# Patient Record
Sex: Female | Born: 1940 | Race: Black or African American | Hispanic: No | Smoking: Never smoker
Health system: Southern US, Community
[De-identification: ages and names within clinical notes are randomized; demographics above are authoritative.]

## PROBLEM LIST (undated history)

## (undated) DIAGNOSIS — T7840XA Allergy, unspecified, initial encounter: Secondary | ICD-10-CM

## (undated) DIAGNOSIS — M1990 Unspecified osteoarthritis, unspecified site: Secondary | ICD-10-CM

## (undated) DIAGNOSIS — I509 Heart failure, unspecified: Secondary | ICD-10-CM

## (undated) DIAGNOSIS — E785 Hyperlipidemia, unspecified: Secondary | ICD-10-CM

## (undated) DIAGNOSIS — F419 Anxiety disorder, unspecified: Secondary | ICD-10-CM

## (undated) DIAGNOSIS — R0902 Hypoxemia: Secondary | ICD-10-CM

## (undated) DIAGNOSIS — F32A Depression, unspecified: Secondary | ICD-10-CM

## (undated) DIAGNOSIS — F329 Major depressive disorder, single episode, unspecified: Secondary | ICD-10-CM

## (undated) HISTORY — PX: EYE SURGERY: SHX253

## (undated) HISTORY — DX: Allergy, unspecified, initial encounter: T78.40XA

## (undated) HISTORY — DX: Major depressive disorder, single episode, unspecified: F32.9

## (undated) HISTORY — DX: Depression, unspecified: F32.A

## (undated) HISTORY — PX: ABDOMINAL HYSTERECTOMY: SHX81

## (undated) HISTORY — DX: Hyperlipidemia, unspecified: E78.5

## (undated) HISTORY — DX: Unspecified osteoarthritis, unspecified site: M19.90

## (undated) HISTORY — DX: Hypoxemia: R09.02

## (undated) HISTORY — DX: Anxiety disorder, unspecified: F41.9

## (undated) HISTORY — PX: CATARACT EXTRACTION: SUR2

---

## 2014-10-22 DIAGNOSIS — I10 Essential (primary) hypertension: Secondary | ICD-10-CM | POA: Diagnosis not present

## 2015-05-11 DIAGNOSIS — I10 Essential (primary) hypertension: Secondary | ICD-10-CM | POA: Diagnosis not present

## 2015-05-11 DIAGNOSIS — R7309 Other abnormal glucose: Secondary | ICD-10-CM | POA: Diagnosis not present

## 2015-06-23 DIAGNOSIS — Z1231 Encounter for screening mammogram for malignant neoplasm of breast: Secondary | ICD-10-CM | POA: Diagnosis not present

## 2018-05-23 ENCOUNTER — Ambulatory Visit
Admission: RE | Admit: 2018-05-23 | Discharge: 2018-05-23 | Disposition: A | Discharge status: Home / Self Care | Payer: Self-pay | Source: Ambulatory Visit | Attending: Family Medicine | Admitting: Family Medicine

## 2018-05-23 ENCOUNTER — Other Ambulatory Visit: Payer: Self-pay | Admitting: Family Medicine

## 2018-05-23 DIAGNOSIS — J9801 Acute bronchospasm: Secondary | ICD-10-CM

## 2018-06-15 ENCOUNTER — Ambulatory Visit
Admission: RE | Admit: 2018-06-15 | Discharge: 2018-06-15 | Disposition: A | Discharge status: Home / Self Care | Payer: Medicare Other | Source: Ambulatory Visit | Attending: Family Medicine | Admitting: Family Medicine

## 2018-06-15 ENCOUNTER — Other Ambulatory Visit: Payer: Self-pay | Admitting: Family Medicine

## 2018-06-15 DIAGNOSIS — J181 Lobar pneumonia, unspecified organism: Secondary | ICD-10-CM

## 2018-08-28 ENCOUNTER — Ambulatory Visit: Payer: Medicare Other | Admitting: Pulmonary Disease

## 2018-08-28 ENCOUNTER — Encounter: Payer: Self-pay | Admitting: Pulmonary Disease

## 2018-08-28 ENCOUNTER — Ambulatory Visit (INDEPENDENT_AMBULATORY_CARE_PROVIDER_SITE_OTHER)
Admission: RE | Admit: 2018-08-28 | Discharge: 2018-08-28 | Disposition: A | Discharge status: Home / Self Care | Payer: Medicare Other | Source: Ambulatory Visit | Attending: Pulmonary Disease | Admitting: Pulmonary Disease

## 2018-08-28 VITALS — BP 126/78 | HR 102 | Ht 65.0 in | Wt 161.0 lb

## 2018-08-28 DIAGNOSIS — R918 Other nonspecific abnormal finding of lung field: Secondary | ICD-10-CM | POA: Diagnosis not present

## 2018-08-28 DIAGNOSIS — J189 Pneumonia, unspecified organism: Secondary | ICD-10-CM

## 2018-08-28 NOTE — Patient Instructions (Signed)
Chest x-ray to follow-up on pneumonia. Ambulatory saturation.  Based on this we will decide next step

## 2018-08-28 NOTE — Progress Notes (Signed)
Subjective:    Patient ID: Sabrina Douglas, female    DOB: 10/04/1940, 78 y.o.   MRN: [id number]  HPI  Chief Complaint  Patient presents with  . Consult    self referral for SOB on exertion   78 year old never smoker presents as a self-referral for evaluation of pneumonia. She reports that she was diagnosed with "double pneumonia" in October 2019. She presented with a lingering cough that seems to have been ongoing for some time prior to this episode to her PCP, chest x-ray from 05/23/2018 was personally reviewed which shows bilateral left more than right airspace disease.  She was started on azithromycin and given albuterol nebs in the office also given a Symbicort inhaler.  She initially improved somewhat but daughter feels that symptoms have mostly persisted. Follow-up chest x-ray 06/15/2018 showed slight decrease in bilateral airspace disease. She reports a dry cough especially in the mornings, increase shortness of breath to the point of even on talking excessively over routine activities daily.  She also complains of generalized weakness. She has lost 12 pounds from 173 to her current weight of 161 pounds. She reports bilateral leg swelling for about 2 years.  She reports increased nasal drainage and has been taking Mucinex for this.  Chest x-ray was obtained again today which shows bilateral interstitial infiltrates left more than right.  She is widowed, she is a retired [employer] representative. Past medical history of hypertension hyperlipidemia  On ambulation saturation dropped to 92% on third lap and heart rate went up from 102 resting to 128 and ambulation was stopped due to shortness of breath  Past Medical History:  Diagnosis Date  . Anxiety   . Depression   . Hyperlipidemia    History reviewed. No pertinent surgical history.  No Known Allergies  Social History   Socioeconomic History  . Marital status: Divorced    Spouse name: Not on file  . Number of children: Not on file  .  Years of education: Not on file  . Highest education level: Not on file  Occupational History  . Not on file  Social Needs  . Financial resource strain: Not on file  . Food insecurity:    Worry: Not on file    Inability: Not on file  . Transportation needs:    Medical: Not on file    Non-medical: Not on file  Tobacco Use  . Smoking status: Never Smoker  . Smokeless tobacco: Never Used  Substance and Sexual Activity  . Alcohol use: Never    Frequency: Never  . Drug use: Not on file  . Sexual activity: Not on file  Lifestyle  . Physical activity:    Days per week: Not on file    Minutes per session: Not on file  . Stress: Not on file  Relationships  . Social connections:    Talks on phone: Not on file    Gets together: Not on file    Attends religious service: Not on file    Active member of club or organization: Not on file    Attends meetings of clubs or organizations: Not on file    Relationship status: Not on file  . Intimate partner violence:    Fear of current or ex partner: Not on file    Emotionally abused: Not on file    Physically abused: Not on file    Forced sexual activity: Not on file  Other Topics Concern  . Not on file  Social History Narrative  .  Not on file    History reviewed. No pertinent family history.   Review of Systems  Constitutional: Positive for fatigue.  HENT: Positive for congestion.   Eyes: Negative.   Respiratory: Positive for shortness of breath and wheezing.   Cardiovascular: Negative.   Gastrointestinal: Negative.   Endocrine: Negative.   Genitourinary: Negative.   Musculoskeletal: Negative.   Skin: Negative.   Allergic/Immunologic: Negative.   Neurological: Negative.   Hematological: Negative.   Psychiatric/Behavioral: Negative.        Objective:   Physical Exam  Gen. Pleasant, obese, in no distress, normal affect ENT - no pallor,icterus, no post nasal drip, class 2-3 airway Neck: No JVD, no thyromegaly, no  carotid bruits Lungs: no use of accessory muscles, no dullness to percussion, left basal rales no  rhonchi  Cardiovascular: Rhythm regular, heart sounds  normal, no murmurs or gallops, 1+ Johnny BridgeSaraiya will see you tomorrow peripheral edema Abdomen: soft and non-tender, no hepatosplenomegaly, BS normal. Musculoskeletal: No deformities, no cyanosis or clubbing Neuro:  alert, non focal, no tremors       Assessment & Plan:

## 2018-08-28 NOTE — Assessment & Plan Note (Signed)
She has bilateral pulmonary infiltrates which have improved compared to October 2019 and today's film but have mostly persisted.  She seems to have had a lingering cough from prior to this episode.  She never had any acute illness so I would doubt that she had bacterial pneumonia.  But would be concerned about interstitial lung disease. We will proceed with high-resolution CT of the chest and based on this we will further clarify further work-up  She does seem to desaturate on ambulation, if infiltrates are not significant then we will consider for pulmonary embolism although this seems to be less likely

## 2018-08-29 ENCOUNTER — Telehealth: Payer: Self-pay | Admitting: Pulmonary Disease

## 2018-08-29 NOTE — Telephone Encounter (Signed)
See result note from 08/29/2018.

## 2018-08-30 ENCOUNTER — Telehealth: Payer: Self-pay | Admitting: Pulmonary Disease

## 2018-08-30 DIAGNOSIS — J849 Interstitial pulmonary disease, unspecified: Secondary | ICD-10-CM

## 2018-08-30 DIAGNOSIS — R918 Other nonspecific abnormal finding of lung field: Secondary | ICD-10-CM

## 2018-08-30 MED ORDER — ALPRAZOLAM 0.5 MG PO TABS: ORAL_TABLET | ORAL | 0 refills | Status: DC

## 2018-08-30 NOTE — Telephone Encounter (Signed)
Spoke with pt regarding the result note from 08/29/18:    Notes recorded by Oretha Milch, MD on 08/29/2018 at 5:24 PM EST Okay to provide Xanax 0.5 mg x 1 ------  Notes recorded by Maurene Capes, CMA on 08/29/2018 at 3:05 PM EST Spoke with patient's daughter. She verbalized understanding. She wants to know if the patient will need an antibiotic prior to the CT scan? She also stated that the patient is severely claustrophobic and is scared of CTs. If the CT is necessary, she would like to have a sedative for her nerves.   RA, please advise. ------  Notes recorded by Oretha Milch, MD on 08/29/2018 at 9:12 AM EST Interstitial pneumonia type work markings are still present on her chest x-ray. Would recommend high-resolution CT of the chest without contrast for further evaluation  CT ordered and daughter aware of recs regarding alprazolam  Rx was called to pharm

## 2018-08-30 NOTE — Addendum Note (Signed)
Addended by: Oretha Milch on: 08/30/2018 03:55 PM   Modules accepted: Orders

## 2018-09-04 ENCOUNTER — Telehealth: Payer: Self-pay | Admitting: Pulmonary Disease

## 2018-09-04 ENCOUNTER — Encounter (HOSPITAL_COMMUNITY): Payer: Self-pay

## 2018-09-04 ENCOUNTER — Emergency Department (HOSPITAL_COMMUNITY): Payer: Medicare Other

## 2018-09-04 ENCOUNTER — Emergency Department (HOSPITAL_COMMUNITY)
Admission: EM | Admit: 2018-09-04 | Discharge: 2018-09-04 | Disposition: A | Discharge status: Home / Self Care | Payer: Medicare Other | Attending: Emergency Medicine | Admitting: Emergency Medicine

## 2018-09-04 DIAGNOSIS — Z79899 Other long term (current) drug therapy: Secondary | ICD-10-CM | POA: Diagnosis not present

## 2018-09-04 DIAGNOSIS — E785 Hyperlipidemia, unspecified: Secondary | ICD-10-CM | POA: Insufficient documentation

## 2018-09-04 DIAGNOSIS — B370 Candidal stomatitis: Secondary | ICD-10-CM | POA: Diagnosis not present

## 2018-09-04 DIAGNOSIS — J849 Interstitial pulmonary disease, unspecified: Secondary | ICD-10-CM | POA: Diagnosis not present

## 2018-09-04 DIAGNOSIS — R0602 Shortness of breath: Secondary | ICD-10-CM | POA: Diagnosis present

## 2018-09-04 LAB — CBC
HCT: 42.2 % (ref 36.0–46.0)
Hemoglobin: 13.3 g/dL (ref 12.0–15.0)
MCH: 30.1 pg (ref 26.0–34.0)
MCHC: 31.5 g/dL (ref 30.0–36.0)
MCV: 95.5 fL (ref 80.0–100.0)
Platelets: 361 10*3/uL (ref 150–400)
RBC: 4.42 MIL/uL (ref 3.87–5.11)
RDW: 12.1 % (ref 11.5–15.5)
WBC: 4.9 10*3/uL (ref 4.0–10.5)
nRBC: 0 % (ref 0.0–0.2)

## 2018-09-04 LAB — BASIC METABOLIC PANEL
Anion gap: 14 (ref 5–15)
BUN: <5 mg/dL — ABNORMAL LOW (ref 8–23)
CO2: 26 mmol/L (ref 22–32)
Calcium: 9.4 mg/dL (ref 8.9–10.3)
Chloride: 94 mmol/L — ABNORMAL LOW (ref 98–111)
Creatinine, Ser: 0.68 mg/dL (ref 0.44–1.00)
GFR calc Af Amer: >60 mL/min (ref >60)
GFR calc non Af Amer: >60 mL/min (ref >60)
Glucose, Bld: 116 mg/dL — ABNORMAL HIGH (ref 70–99)
Potassium: 4.6 mmol/L (ref 3.5–5.1)
Sodium: 134 mmol/L — ABNORMAL LOW (ref 135–145)

## 2018-09-04 LAB — I-STAT TROPONIN, ED: Troponin i, poc: 0.01 ng/mL (ref 0.00–0.08)

## 2018-09-04 LAB — BRAIN NATRIURETIC PEPTIDE: B Natriuretic Peptide: 15.5 pg/mL (ref 0.0–100.0)

## 2018-09-04 MED ORDER — IOPAMIDOL (ISOVUE-370) INJECTION 76%
100.0000 mL | Freq: Once | INTRAVENOUS | Status: AC | PRN
  Administered 2018-09-04: 100 mL via INTRAVENOUS

## 2018-09-04 MED ORDER — NYSTATIN 100000 UNIT/ML MT SUSP: 500000.0000 [IU] | Freq: Four times a day (QID) | OROMUCOSAL | 0 refills | Status: DC

## 2018-09-04 MED ORDER — PREDNISONE 20 MG PO TABS: 40.0000 mg | ORAL_TABLET | Freq: Every day | ORAL | 0 refills | Status: DC

## 2018-09-04 NOTE — Telephone Encounter (Signed)
Spoke to Dr. Clarene DukeLittle. Suggested she add CCP and ANA to her blood work. High-resolution CT chest is scheduled for 1/30. 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

## 2018-09-04 NOTE — ED Triage Notes (Signed)
Per [facility], pt endorses Increased shob x 1 week with palpitations with exertion. Has had pneumonia since October. Due for CT scan tomorrow. Breath sounds clear. Placed on Leonard 2L.

## 2018-09-04 NOTE — Telephone Encounter (Signed)
Will route message to Dr. Vassie Loll. I have made Cherina aware of this.

## 2018-09-04 NOTE — ED Notes (Signed)
ED Provider at bedside. 

## 2018-09-04 NOTE — ED Notes (Signed)
Water given to pt after getting approval from MD Little.

## 2018-09-04 NOTE — ED Provider Notes (Signed)
[facility]   CSN: [id number] Arrival date & time: 09/04/18  [id number]     History   Chief Complaint Chief Complaint  Patient presents with  . Pneumonia  . Shortness of Breath  . Palpitations    HPI Sabrina Douglas is a 78 y.o. female.  78 year old female with past medical history including hypertension, hyperlipidemia, anxiety/depression who presents with shortness of breath.  Patient was diagnosed w/ pneumonia in October of last year, pleated course of antibiotics.  Family states that she initially seemed to be getting better but since December has been progressively more short of breath.  Her shortness of breath is worse with exertion.  She has not noticed any orthopnea or paroxysmal nocturnal dyspnea.  She has chronic bilateral lower extremity edema that is been present for the past year.  She notes a persistent cough productive of mucus.  The cough is usually worse in the morning and then gets better once she clears some mucus.  No fevers or chest pain. No recent travel. She was seen by [facility] recently in clinic and they have scheduled a chest CT.  The history is provided by the patient and a relative.  Pneumonia  Associated symptoms include shortness of breath.  Shortness of Breath  Palpitations  Associated symptoms: shortness of breath     Past Medical History:  Diagnosis Date  . Anxiety   . Depression   . Hyperlipidemia     Patient Active Problem List   Diagnosis Date Noted  . Pulmonary infiltrates on CXR 08/28/2018    History reviewed. No pertinent surgical history.   OB History   No obstetric history on file.      Home Medications    Prior to Admission medications   Medication Sig Start Date End Date Taking? Authorizing Provider  Glycerin-Hypromellose-PEG 400 ([facility] DRY EYE RELIEF) 0.2-0.2-1 % SOLN Place 2 drops into both eyes as needed (Dry eyes).   Yes [provider]  metoprolol tartrate  (LOPRESSOR) 25 MG tablet Take 25 mg by mouth 2 (two) times daily.   Yes [provider]  Multiple Vitamin (MULTIVITAMIN WITH MINERALS) TABS tablet Take 1 tablet by mouth daily.   Yes [provider]  simvastatin (ZOCOR) 20 MG tablet Take 20 mg by mouth daily.   Yes [provider]  spironolactone (ALDACTONE) 25 MG tablet Take 25 mg by mouth daily.   Yes [provider]  ALPRAZolam Prudy Feeler(XANAX) 0.5 MG tablet 1 tablet 30 min prior to ct 08/30/18   Oretha MilchAlva, Rakesh V, MD  nystatin (MYCOSTATIN) 100000 UNIT/ML suspension Take 5 mLs (500,000 Units total) by mouth 4 (four) times daily. For 1 week or until white plaque on tongue resolves 09/04/18   Little, Ambrose Finlandachel Morgan, MD  predniSONE (DELTASONE) 20 MG tablet Take 2 tablets (40 mg total) by mouth daily. Take 40 mg by mouth daily for 3 days, then 20mg  by mouth daily for 3 days, then 10mg  daily for 3 days 09/04/18   Little, Ambrose Finlandachel Morgan, MD    Family History History reviewed. No pertinent family history.  Social History Social History   Tobacco Use  . Smoking status: Never Smoker  . Smokeless tobacco: Never Used  Substance Use Topics  . Alcohol use: Never    Frequency: Never  . Drug use: Not on file     Allergies   Patient has no known allergies.   Review of Systems Review of Systems  Respiratory: Positive for shortness of  breath.   Cardiovascular: Positive for palpitations.   All other systems reviewed and are negative except that which was mentioned in HPI   Physical Exam Updated Vital Signs BP 116/62   Pulse (!) 101   Temp 98.3 F (36.8 C) (Oral)   Resp (!) 22   SpO2 100%   Physical Exam Vitals signs and nursing note reviewed.  Constitutional:      General: She is not in acute distress.    Appearance: She is well-developed.  HENT:     Head: Normocephalic and atraumatic.     Mouth/Throat:     Mouth: Mucous membranes are moist.     Comments: Thrush on tongue Eyes:     Conjunctiva/sclera:  Conjunctivae normal.  Neck:     Musculoskeletal: Neck supple.  Cardiovascular:     Rate and Rhythm: Regular rhythm. Tachycardia present.     Heart sounds: Murmur present.  Pulmonary:     Effort: Pulmonary effort is normal. Tachypnea present. No respiratory distress.     Breath sounds: Decreased breath sounds present.     Comments: Mild tachypnea at rest, diminished b/l w/ crackles in bases R>L Abdominal:     General: Bowel sounds are normal. There is no distension.     Palpations: Abdomen is soft.     Tenderness: There is no abdominal tenderness.  Musculoskeletal:     Right lower leg: She exhibits no tenderness. Edema present.     Left lower leg: She exhibits no tenderness. Edema present.     Comments: Mild BLE edema at ankles  Skin:    General: Skin is warm and dry.  Neurological:     Mental Status: She is alert and oriented to person, place, and time.     Comments: Fluent speech  Psychiatric:        Mood and Affect: Mood is anxious.        Judgment: Judgment normal.      ED Treatments / Results  Labs (all labs ordered are listed, but only abnormal results are displayed) Labs Reviewed  BASIC METABOLIC PANEL - Abnormal; Notable for the following components:      Result Value   Sodium 134 (*)    Chloride 94 (*)    Glucose, Bld 116 (*)    BUN <5 (*)    All other components within normal limits  CBC  BRAIN NATRIURETIC PEPTIDE  CYCLIC CITRUL PEPTIDE ANTIBODY, IGG/IGA  ANTINUCLEAR ANTIBODIES, IFA  I-STAT TROPONIN, ED    EKG EKG Interpretation  Date/Time:  Tuesday September 04 2018 07:53:38 EST Ventricular Rate:  102 PR Interval:  148 QRS Duration: 124 QT Interval:  368 QTC Calculation: 479 R Axis:   -43 Text Interpretation:  Sinus tachycardia Left axis deviation Right bundle branch block Inferior infarct , age undetermined Abnormal ECG No previous ECGs available Confirmed by Frederick Peers [phone]) on 09/04/2018 8:42:57 AM   Radiology Dg Chest 2 View  Result  Date: 09/04/2018 CLINICAL DATA:  Shortness of breath EXAM: CHEST - 2 VIEW COMPARISON:  08/28/17 FINDINGS: Low volume chest with interstitial opacities on the left more than right, present since at least 05/23/2018 There is no edema, consolidation, effusion, or pneumothorax. Normal heart size and mediastinal contours. IMPRESSION: Low volume chest and interstitial opacities without progression since most remote chest x-ray 05/23/2018. This is most likely scarring or fibrosis. No acute finding. Electronically Signed   By: Marnee Spring M.D.   On: 09/04/2018 08:54   Ct Angio Chest Pe W/cm &/or Wo  Cm  Result Date: 09/04/2018 CLINICAL DATA:  Shortness of breath. EXAM: CT ANGIOGRAPHY CHEST WITH CONTRAST TECHNIQUE: Multidetector CT imaging of the chest was performed using the standard protocol during bolus administration of intravenous contrast. Multiplanar CT image reconstructions and MIPs were obtained to evaluate the vascular anatomy. CONTRAST:  100mL ISOVUE-370 IOPAMIDOL (ISOVUE-370) INJECTION 76% COMPARISON:  Chest x-ray from same day. FINDINGS: Cardiovascular: Satisfactory opacification of the pulmonary arteries to the segmental level. No evidence of pulmonary embolism. Mild right heart enlargement. No pericardial effusion. No thoracic aortic aneurysm or dissection. Mediastinum/Nodes: No enlarged mediastinal, hilar, or axillary lymph nodes. Mild enlargement of the right thyroid lobe without focal abnormality. The trachea and esophagus demonstrate no significant findings. Lungs/Pleura: Lower lobe predominant volume loss, bronchiectasis, ground-glass opacity, and peribronchovascular thickening. Scarring in the lingula. No focal consolidation, pleural effusion, or pneumothorax. 5 mm perifissural nodule in the left upper lobe (series 6, image 29). Upper Abdomen: No acute abnormality. Musculoskeletal: No chest wall abnormality. No acute or significant osseous findings. Review of the MIP images confirms the above  findings. IMPRESSION: 1. No evidence of pulmonary embolism. No acute intrathoracic process. 2. Lower lobe predominant volume loss, bronchiectasis, ground-glass opacity, and peribronchovascular thickening, suspicious for fibrotic nonspecific interstitial pneumonia. Recommend non-emergent high-resolution chest CT for further evaluation. 3. 5 mm perifissural nodule in the left upper lobe. No follow-up needed if patient is low-risk. Non-contrast chest CT can be considered in 12 months if patient is high-risk. This recommendation follows the consensus statement: Guidelines for Management of Incidental Pulmonary Nodules Detected on CT Images: From the Fleischner Society 2017; Radiology 2017; 284:228-243. Electronically Signed   By: Obie DredgeWilliam T Derry M.D.   On: 09/04/2018 13:08    Procedures Procedures (including critical care time)  Medications Ordered in ED Medications  iopamidol (ISOVUE-370) 76 % injection 100 mL (100 mLs Intravenous Contrast Given 09/04/18 1247)     Initial Impression / Assessment and Plan / ED Course  I have reviewed the triage vital signs and the nursing notes.  Pertinent labs & imaging results that were available during my care of the patient were reviewed by me and considered in my medical decision making (see chart for details).     No respiratory distress, O2 sat mid-90s, borderline tachycardia around 105. No O2 requirement. CXR w/ interstitial opacities. Atypical infection is possible, however given chronicity of SOB, I suspect interstitial lung disease. Basic labs and trop reassuring. BNP normal. CTA chest is negative for PE, shows findings c/w fibrotic ILD. I discussed these findings w/ her pulmonogist, Dr. Vassie LollAlva, who also reviewed images. He recommended trial of steroids and add-on of ANA, CPP. He will f/u these labs and see pt in clinic. I discussed steroid course as well as nystatin for oral thrush.  Extensively reviewed return precautions.  Patient and family voiced  understanding.  Final Clinical Impressions(s) / ED Diagnoses   Final diagnoses:  Interstitial lung disease (HCC)  Oral thrush    ED Discharge Orders         Ordered    predniSONE (DELTASONE) 20 MG tablet  Daily     09/04/18 1546    nystatin (MYCOSTATIN) 100000 UNIT/ML suspension  4 times daily     09/04/18 1546           Little, Ambrose Finlandachel Morgan, MD 09/04/18 1556

## 2018-09-04 NOTE — Telephone Encounter (Signed)
Spoke with Dr. Clarene Duke. She states that the pt came to the ER and she ordered a CT on the pt. She wants to speak to Dr. Vassie Loll about the findings.

## 2018-09-04 NOTE — ED Notes (Signed)
Dr. Vassie Loll paged to [phone]-per Dr. Brunetta Genera by Marylene Land

## 2018-09-04 NOTE — ED Notes (Signed)
Patient verbalizes understanding of discharge instructions. Opportunity for questioning and answers were provided. Armband removed by staff, pt discharged from ED via wheelchair to home.  

## 2018-09-04 NOTE — ED Notes (Signed)
This RN assisted pt to bathroom in wheelchair.

## 2018-09-05 LAB — FANA STAINING PATTERNS: Speckled Pattern: 1:1280 {titer}

## 2018-09-05 LAB — ANTINUCLEAR ANTIBODIES, IFA: ANA Ab, IFA: POSITIVE — AB

## 2018-09-05 LAB — CYCLIC CITRUL PEPTIDE ANTIBODY, IGG/IGA: CCP Antibodies IgG/IgA: 11 units (ref 0–19)

## 2018-09-05 NOTE — Telephone Encounter (Signed)
Pt is scheduled to see you on 09/27/2018, is this okay?

## 2018-09-05 NOTE — Telephone Encounter (Signed)
Needs sooner appointment week after next

## 2018-09-05 NOTE — Telephone Encounter (Signed)
Left message for patient's daughter to call back in the morning.

## 2018-09-06 ENCOUNTER — Ambulatory Visit (INDEPENDENT_AMBULATORY_CARE_PROVIDER_SITE_OTHER)
Admission: RE | Admit: 2018-09-06 | Discharge: 2018-09-06 | Disposition: A | Discharge status: Home / Self Care | Payer: Medicare Other | Source: Ambulatory Visit | Attending: Pulmonary Disease | Admitting: Pulmonary Disease

## 2018-09-06 DIAGNOSIS — R918 Other nonspecific abnormal finding of lung field: Secondary | ICD-10-CM

## 2018-09-06 NOTE — Telephone Encounter (Signed)
Patient's daughter, Gunnar Fusiaula, returned call, CB is [phone].  We rescheduled appt for 09/18/2018 at 4:00 pm (week after next).

## 2018-09-06 NOTE — Telephone Encounter (Signed)
Noted! Thank you

## 2018-09-18 ENCOUNTER — Ambulatory Visit: Payer: Medicare Other | Admitting: Pulmonary Disease

## 2018-09-18 ENCOUNTER — Ambulatory Visit: Payer: Medicare Other | Admitting: Primary Care

## 2018-09-18 ENCOUNTER — Encounter: Payer: Self-pay | Admitting: Primary Care

## 2018-09-18 VITALS — BP 120/72 | HR 107 | Ht 65.0 in | Wt 150.6 lb

## 2018-09-18 DIAGNOSIS — J849 Interstitial pulmonary disease, unspecified: Secondary | ICD-10-CM | POA: Diagnosis not present

## 2018-09-18 MED ORDER — ALBUTEROL SULFATE HFA 108 (90 BASE) MCG/ACT IN AERS: 2.0000 | INHALATION_SPRAY | Freq: Four times a day (QID) | RESPIRATORY_TRACT | 6 refills | Status: DC | PRN

## 2018-09-18 MED ORDER — PREDNISONE 10 MG PO TABS: ORAL_TABLET | ORAL | 0 refills | Status: DC

## 2018-09-18 NOTE — Progress Notes (Signed)
 @Patient  ID: Sabrina Douglas, female    DOB: 1941-03-29, 78 y.o.   MRN: [id number]  Chief Complaint  Patient presents with  . Follow-up    F/U after CT scan.     Referring provider: Renaye RakersBland, Veita, MD  HPI: 78 year old female, never smoked. PMH hypertension, hyperlipidemia, anxiety/depression. Self referred for evaluation of pneumonia in October 2019. Patient seen for initial consult with Dr. Vassie LollAlva on 08/28/18. CXR showed mildly worsened bibasilar pneumonia, suggestive of atypical infection d/t waxing/weaning symptoms. 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 Ordered for HRCT, concern for ILD. RX short course prednisone. ANA positive, CCP negative. 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 Recommended repeat in 12 months to assess for temporal changes.   09/18/2018 Patient presents today for 4 week follow-up and review HRCT results. Accompanied by her two daughters. She has intermittent dry cough and shortness of breath. Associated anxiety and decreased appetite. Reports improvement in symptoms with oral prednisone, symptoms return/worsen when off. Symbicort inhaler caused thrush symptoms, given nystatin mouth wash with no improvement. On liquid Diflucan currently x 10 days prescribed by PCP. Eating mostly soft foods. Taking Ensure shakes.    No Known Allergies   There is no immunization history on file for this patient.  Past Medical History:  Diagnosis Date  . Anxiety   . Depression   . Hyperlipidemia     Tobacco History: Social History   Tobacco Use  Smoking Status Never Smoker  Smokeless Tobacco Never Used   Counseling given: Not Answered   Outpatient Medications Prior to Visit  Medication Sig Dispense Refill  . ALPRAZolam (XANAX) 0.25 MG tablet Take 0.25 mg by mouth as needed for anxiety.    . fluconazole (DIFLUCAN) 40 MG/ML suspension Take by mouth daily.    . Glycerin-Hypromellose-PEG 400 ([facility] DRY EYE RELIEF) 0.2-0.2-1 %  SOLN Place 2 drops into both eyes as needed (Dry eyes).    . metoprolol tartrate (LOPRESSOR) 25 MG tablet Take 25 mg by mouth 2 (two) times daily.    . Multiple Vitamin (MULTIVITAMIN WITH MINERALS) TABS tablet Take 1 tablet by mouth daily.    Marland Kitchen. nystatin (MYCOSTATIN) 100000 UNIT/ML suspension Take 5 mLs (500,000 Units total) by mouth 4 (four) times daily. For 1 week or until white plaque on tongue resolves 60 mL 0  . simvastatin (ZOCOR) 20 MG tablet Take 20 mg by mouth daily.    Marland Kitchen. spironolactone (ALDACTONE) 25 MG tablet Take 25 mg by mouth daily.    Marland Kitchen. ALPRAZolam (XANAX) 0.5 MG tablet 1 tablet 30 min prior to ct 1 tablet 0  . predniSONE (DELTASONE) 20 MG tablet Take 2 tablets (40 mg total) by mouth daily. Take 40 mg by mouth daily for 3 days, then 20mg  by mouth daily for 3 days, then 10mg  daily for 3 days 12 tablet 0   No facility-administered medications prior to visit.     Review of Systems  Review of Systems  Constitutional: Positive for appetite change and unexpected weight change.  Respiratory: Positive for cough and shortness of breath. Negative for apnea, choking, chest tightness and wheezing.     Physical Exam  BP 120/72   Pulse (!) 107   Ht 5\' 5"  (1.651 m)   Wt 150 lb 9.6 oz (68.3 kg)   SpO2 99%   BMI 25.06 kg/m  Physical Exam Constitutional:      General: She is not in acute distress.    Appearance: Normal  appearance.  HENT:     Head: Normocephalic and atraumatic.     Right Ear: Tympanic membrane normal.     Left Ear: Tympanic membrane normal.     Mouth/Throat:     Mouth: Mucous membranes are moist.     Pharynx: Oropharynx is clear.  Eyes:     Extraocular Movements: Extraocular movements intact.     Pupils: Pupils are equal, round, and reactive to light.  Cardiovascular:     Rate and Rhythm: Normal rate and regular rhythm.  Pulmonary:     Effort: Pulmonary effort is normal. No respiratory distress.     Breath sounds: No wheezing or rhonchi.     Comments: Fine  crackles right lower and middle lobe Skin:    General: Skin is warm and dry.  Neurological:     General: No focal deficit present.     Mental Status: She is alert and oriented to person, place, and time. Mental status is at baseline.  Psychiatric:        Mood and Affect: Mood normal.        Behavior: Behavior normal.        Thought Content: Thought content normal.        Judgment: Judgment normal.     Comments: Teary eyed, anxious      Lab Results:  CBC    Component Value Date/Time   WBC 4.9 09/04/2018 0803   RBC 4.42 09/04/2018 0803   HGB 13.3 09/04/2018 0803   HCT 42.2 09/04/2018 0803   PLT 361 09/04/2018 0803   MCV 95.5 09/04/2018 0803   MCH 30.1 09/04/2018 0803   MCHC 31.5 09/04/2018 0803   RDW 12.1 09/04/2018 0803    BMET    Component Value Date/Time   NA 134 (L) 09/04/2018 0803   K 4.6 09/04/2018 0803   CL 94 (L) 09/04/2018 0803   CO2 26 09/04/2018 0803   GLUCOSE 116 (H) 09/04/2018 0803   BUN <5 (L) 09/04/2018 0803   CREATININE 0.68 09/04/2018 0803   CALCIUM 9.4 09/04/2018 0803   GFRNONAA >60 09/04/2018 0803   GFRAA >60 09/04/2018 0803    BNP    Component Value Date/Time   BNP 15.5 09/04/2018 0803    ProBNP No results found for: PROBNP  Imaging: Dg Chest 2 View  Result Date: 09/04/2018 CLINICAL DATA:  Shortness of breath EXAM: CHEST - 2 VIEW COMPARISON:  08/28/17 FINDINGS: Low volume chest with interstitial opacities on the left more than right, present since at least 05/23/2018 There is no edema, consolidation, effusion, or pneumothorax. Normal heart size and mediastinal contours. IMPRESSION: Low volume chest and interstitial opacities without progression since most remote chest x-ray 05/23/2018. This is most likely scarring or fibrosis. No acute finding. Electronically Signed   By: Marnee SpringJonathon  Watts M.D.   On: 09/04/2018 08:54   Dg Chest 2 View  Result Date: 08/29/2018 CLINICAL DATA:  Pneumonia follow-up. EXAM: CHEST - 2 VIEW COMPARISON:  Chest x-ray  dated June 15, 2018. FINDINGS: The heart size and mediastinal contours are within normal limits. Normal pulmonary vascularity. Interstitial opacities at the lung bases and in the lingula appears slightly worse when compared to prior study. No pleural effusion or pneumothorax. No acute osseous abnormality. IMPRESSION: 1. Mildly worsened bibasilar pneumonia. Given waxing and waning appearance on chest x-ray, this could reflect chronic atypical infection. Consider chest CT for further evaluation. Electronically Signed   By: Obie DredgeWilliam T Derry M.D.   On: 08/29/2018 08:34   Ct Angio Chest  Pe W/cm &/or Wo Cm  Result Date: 09/04/2018 CLINICAL DATA:  Shortness of breath. EXAM: CT ANGIOGRAPHY CHEST WITH CONTRAST TECHNIQUE: Multidetector CT imaging of the chest was performed using the standard protocol during bolus administration of intravenous contrast. Multiplanar CT image reconstructions and MIPs were obtained to evaluate the vascular anatomy. CONTRAST:  ISOVUE-370 IOPAMIDOL (ISOVUE-370) INJECTION 76% COMPARISON:  Chest x-ray from same day. FINDINGS: Cardiovascular: Satisfactory opacification of the pulmonary arteries to the segmental level. No evidence of pulmonary embolism. Mild right heart enlargement. No pericardial effusion. No thoracic aortic aneurysm or dissection. Mediastinum/Nodes: No enlarged mediastinal, hilar, or axillary lymph nodes. Mild enlargement of the right thyroid lobe without focal abnormality. The trachea and esophagus demonstrate no significant findings. Lungs/Pleura: Lower lobe predominant volume loss, bronchiectasis, ground-glass opacity, and peribronchovascular thickening. Scarring in the lingula. No focal consolidation, pleural effusion, or pneumothorax. 5 mm perifissural nodule in the left upper lobe (series 6, image 29). Upper Abdomen: No acute abnormality. Musculoskeletal: No chest wall abnormality. No acute or significant osseous findings. Review of the MIP images confirms the above  findings. IMPRESSION: 1. No evidence of pulmonary embolism. No acute intrathoracic process. 2. Lower lobe predominant volume loss, bronchiectasis, ground-glass opacity, and peribronchovascular thickening, suspicious for fibrotic nonspecific interstitial pneumonia. Recommend non-emergent high-resolution chest CT for further evaluation. 3. 5 mm perifissural nodule in the left upper lobe. No follow-up needed if patient is low-risk. Non-contrast chest CT can be considered in 12 months if patient is high-risk. This recommendation follows the consensus statement: Guidelines for Management of Incidental Pulmonary Nodules Detected on CT Images: From the Fleischner Society 2017; Radiology 2017; 284:228-243. Electronically Signed   By: Obie Dredge M.D.   On: 09/04/2018 13:08   Ct Chest High Resolution  Result Date: 09/10/2018 CLINICAL DATA:  78 year old female with history of unresolved pneumonia. Follow-up study. EXAM: CT CHEST WITHOUT CONTRAST TECHNIQUE: Multidetector CT imaging of the chest was performed following the standard protocol without intravenous contrast. High resolution imaging of the lungs, as well as inspiratory and expiratory imaging, was performed. COMPARISON:  Chest CT 09/04/2018. FINDINGS: Cardiovascular: Heart size is normal. There is no significant pericardial fluid, thickening or pericardial calcification. Aortic atherosclerosis. No definite coronary artery calcifications. Mediastinum/Nodes: No pathologically enlarged mediastinal or hilar lymph nodes. Please note that accurate exclusion of hilar adenopathy is limited on noncontrast CT scans. Esophagus is unremarkable in appearance. No axillary lymphadenopathy. Lungs/Pleura: High-resolution images demonstrate patchy areas of ground-glass attenuation, septal thickening, severe thickening of the peribronchovascular interstitium, cylindrical bronchiectasis and peripheral bronchiolectasis in the lungs bilaterally, most evident in the lower lobes.  These findings have a definitive craniocaudal gradient. No honeycombing is identified. Inspiratory and expiratory imaging is unremarkable. No acute consolidative airspace disease. No pleural effusions. No definite suspicious appearing pulmonary nodules or masses are noted. Upper Abdomen: 1.5 cm exophytic low-attenuation lesion in the upper pole of the left kidney, incompletely characterized on today's noncontrast CT examination, but similar to the prior study and statistically likely a cyst. Musculoskeletal: There are no aggressive appearing lytic or blastic lesions noted in the visualized portions of the skeleton. IMPRESSION: 1. 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 Repeat high-resolution chest CT is recommended in 12 months to assess for temporal changes in the appearance of the lung parenchyma. 2. Aortic atherosclerosis. Aortic Atherosclerosis (ICD10-I70.0). Electronically Signed   By: Trudie Reed M.D.   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 Common causes include connective tissue disease, RA, hypersensitivity pneumonitis, drug toxicity, asbestosis - ANA positive, CCP negative  - Needs additional ILD labs, ILD questionnaire and PFTs with DLCO  - RX albuterol hfa prn sob/wheezing and prednisone taper (to have on hand) - Advise flutter valve QID for congestion  - Consider pulmonary rehab - FU in 1-3 weeks with Dr. Vassie Loll    >25-30 mins spent face to face with patient/counseling   Glenford Bayley, NP 09/18/2018

## 2018-09-18 NOTE — Assessment & Plan Note (Addendum)
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 Common causes include connective tissue disease, RA, hypersensitivity pneumonitis, drug toxicity, asbestosis - ANA positive, CCP negative  - Needs additional ILD labs, ILD questionnaire and PFTs with DLCO  - RX albuterol hfa prn sob/wheezing and prednisone taper (to have on hand) - Advise flutter valve QID for congestion  - Consider pulmonary rehab - FU in 1-3 weeks with Dr. Vassie Loll

## 2018-09-18 NOTE — Patient Instructions (Addendum)
HRCT showed evidence of interstitial lung disease. We need to do additional labs to rule out possible causes.  Common causes are connective tissue disease, rheumatoid arthritis, drug toxicity, hypersensitivity pneumonitis.   Orders: - ILD labs and questionnaire  - PFTs with DLCO  RX: - Albuterol rescue inhaler as needed for shortness of breath/wheezing - Prednisone taper (to have on hand)  Flutter valve 4 times day for congestion  Follow up: Next available with Dr. Vassie Loll (1-3 weeks)    Pulmonary Fibrosis  Pulmonary fibrosis is a type of lung disease that causes scarring. Over time, the scar tissue builds up in the air sacs of your lungs (alveoli). This makes it hard for you to breathe. Less oxygen can get into your blood. Scarring from pulmonary fibrosis gets worse over time. This damage is permanent and may lead to other serious health problems. What are the causes? There are many different causes of pulmonary fibrosis. Sometimes the cause is not known. This is called idiopathic pulmonary fibrosis. Other causes include:  Exposure to chemicals and substances found in agricultural, farm, Holiday representative, or factory work. These include mold, asbestos, silica, metal dusts, and toxic fumes.  Sarcoidosis. In this disease, areas of inflammatory cells (granulomas) form and most often affect the lungs.  Autoimmune diseases. These include diseases such as rheumatoid arthritis, systemic sclerosis, or connective tissue disease.  Taking certain medicines. These include drugs used in radiation therapy or used to treat seizures, heart problems, and some infections. What increases the risk? You are more likely to develop this condition if:  You have a family history of the disease.  You are older. The condition is more common in older adults.  You have a history of smoking.  You have a job that exposes you to certain chemicals.  You have gastroesophageal reflux disease (GERD). What are the  signs or symptoms? Symptoms of this condition include:  Difficulty breathing that gets worse with activity.  Shortness of breath (dyspnea).  Dry, hacking cough.  Rapid, shallow breathing during exercise or while at rest.  Bluish skin and lips.  Loss of appetite.  Weakness.  Weight loss and fatigue.  Rounded and enlarged fingertips (clubbing). How is this diagnosed? This condition may be diagnosed based on:  Your symptoms and medical history.  A physical exam. You may also have tests, including:  A test that involves looking inside your lungs with an instrument (bronchoscopy).  Imaging studies of your lungs and heart.  Tests to measure how well you are breathing (pulmonary function tests).  Blood tests.  Tests to see how well your lungs work while you are walking (pulmonary stress test).  A procedure to remove a lung tissue sample to look at it under a microscope (biopsy). How is this treated? There is no cure for pulmonary fibrosis. Treatment focuses on managing symptoms and preventing scarring from getting worse. This may include:  Medicines, such as: ? Steroids to prevent permanent lung changes. ? Medicines to suppress your body's defense system (immune system). ? Medicines to help with lung function by reducing inflammation or scarring.  Ongoing monitoring with X-rays and lab work.  Oxygen therapy.  Pulmonary rehabilitation.  Surgery. In some cases, a lung transplant is possible. Follow these instructions at home:     Medicines  Take over-the-counter and prescription medicines only as told by your health care provider.  Keep your vaccinations up to date as recommended by your health care provider. General instructions  Do not use any products that contain nicotine  or tobacco, such as cigarettes and e-cigarettes. If you need help quitting, ask your health care provider.  Get regular exercise, but do not overexert yourself. Ask your health care  provider to suggest some activities that are safe for you to do. ? If you have physical limitations, you may get exercise by walking, using a stationary bike, or doing chair exercises. ? Ask your health care provider about using oxygen while exercising.  If you are exposed to chemicals and substances at work, make sure that you wear a mask or respirator at all times.  Join a pulmonary rehabilitation program or a support group for people with pulmonary fibrosis.  Eat small meals often so you do not get too full. Overeating can make breathing trouble worse.  Maintain a healthy weight. Lose weight if you need to.  Do breathing exercises as directed by your health care provider.  Keep all follow-up visits as told by your health care provider. This is important. Contact a health care provider if you:  Have symptoms that do not get better with medicines.  Are not able to be as active as usual.  Have trouble taking a deep breath.  Have a fever or chills.  Have blue lips or skin.  Have clubbing of your fingers. Get help right away if you:  Have a sudden worsening of your symptoms.  Have chest pain.  Cough up mucus that is dark in color.  Have a lot of headaches.  Get very confused or sleepy. Summary  Pulmonary fibrosis is a type of lung disease that causes scar tissue to build up in the air sacs of your lungs (alveoli) over time. Less oxygen can get into your blood. This makes it hard for you to breathe.  Scarring from pulmonary fibrosis gets worse over time. This damage is permanent and may lead to other serious health problems.  You are more likely to develop this condition if you have a family history of the condition or a job that exposes you to certain chemicals.  There is no cure for pulmonary fibrosis. Treatment focuses on managing symptoms and preventing scarring from getting worse. This information is not intended to replace advice given to you by your health care  provider. Make sure you discuss any questions you have with your health care provider. Document Released: 10/15/2003 Document Revised: 08/30/2017 Document Reviewed: 08/30/2017 Elsevier Interactive Patient Education  2019 [employer].

## 2018-09-19 LAB — SEDIMENTATION RATE: Sed Rate: 104 mm/hr — ABNORMAL HIGH (ref 0–30)

## 2018-09-19 LAB — ANTI-SMITH ANTIBODY: ENA SM Ab Ser-aCnc: 2 AI — AB

## 2018-09-19 MED ORDER — FLUTTER DEVI: 1.0000 | 0 refills | Status: DC | PRN

## 2018-09-19 NOTE — Addendum Note (Signed)
Addended by: Maurene Capes on: 09/19/2018 04:58 PM   Modules accepted: Orders

## 2018-09-20 LAB — CK TOTAL AND CKMB (NOT AT ARMC)
CK, MB: 154 ng/mL — ABNORMAL HIGH (ref 0–5.0)
Relative Index: 20.8 — ABNORMAL HIGH (ref 0–4.0)
Total CK: 742 U/L — ABNORMAL HIGH (ref 29–143)

## 2018-09-20 LAB — RHEUMATOID FACTOR: Rheumatoid fact SerPl-aCnc: 55 IU/mL — ABNORMAL HIGH (ref <14)

## 2018-09-20 LAB — CYCLIC CITRUL PEPTIDE ANTIBODY, IGG: Cyclic Citrullin Peptide Ab: <16 UNITS

## 2018-09-20 LAB — ALDOLASE

## 2018-09-20 LAB — ANTI-NUCLEAR AB-TITER (ANA TITER): ANA Titer 1: 1:1280 {titer} — ABNORMAL HIGH

## 2018-09-20 LAB — ANCA SCREEN W REFLEX TITER

## 2018-09-20 LAB — SJOGREN'S SYNDROME ANTIBODS(SSA + SSB)
SSA (Ro) (ENA) Antibody, IgG: <1 AI
SSB (La) (ENA) Antibody, IgG: <1 AI

## 2018-09-20 LAB — JO-1 ANTIBODY-IGG: Jo-1 Autoabs: <1 AI

## 2018-09-20 LAB — ANA: Anti Nuclear Antibody(ANA): POSITIVE — AB

## 2018-09-20 LAB — ANTI-SCLERODERMA ANTIBODY: Scleroderma (Scl-70) (ENA) Antibody, IgG: <1 AI

## 2018-09-21 ENCOUNTER — Telehealth: Payer: Self-pay | Admitting: Primary Care

## 2018-09-21 DIAGNOSIS — J849 Interstitial pulmonary disease, unspecified: Secondary | ICD-10-CM

## 2018-09-21 NOTE — Telephone Encounter (Signed)
Spoke with [name], she was requesting the status of the pulmonary rehab order. She states Waynetta Sandy was supposed to talk to Dr. Vassie Loll about the plan and get back to her. She states they never received a phone call. Dr. Vassie Loll can we place an order for pulmonary rehab?    Assessment & Plan Note by Glenford Bayley, NP at 09/18/2018 5:38 PM  Author: Glenford Bayley, NP Author Type: Nurse Practitioner Filed: 09/18/2018 5:48 PM  Note Status: Carlisle Cater: Cosign Not Required Encounter Date: 09/18/2018  Problem: ILD (interstitial lung disease) [facility])  Editor: Glenford Bayley, NP (Nurse Practitioner)  Prior Versions: 1. Glenford Bayley, NP (Nurse Practitioner) at 09/18/2018 5:42 PM - Edited   2. 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 Common causes include connective tissue disease, RA, hypersensitivity pneumonitis, drug toxicity, asbestosis - ANA positive, CCP negative  - Needs additional ILD labs, ILD questionnaire and PFTs with DLCO  - RX albuterol hfa prn sob/wheezing and prednisone taper (to have on hand) - Advise flutter valve QID for congestion  - Consider pulmonary rehab - FU in 1-3 weeks with Dr. Vassie Loll

## 2018-09-21 NOTE — Telephone Encounter (Signed)
Daughter called checking on when they would hear about respiratory therapy for patient. It was supposed to be set up and they haven't heard anything. Patient last saw Sabrina Douglas on Tuesday 09/18/18

## 2018-09-21 NOTE — Telephone Encounter (Signed)
Called and spoke with patients daughter. Advised daughter of response below from Dr. Vassie Loll. Advised patients daughter that I have placed the order for the pulmonary rehab. Patients daughter verbalized understanding. No further questions or concerns. Will close encounter.

## 2018-09-21 NOTE — Telephone Encounter (Signed)
Please order for ILD

## 2018-09-24 ENCOUNTER — Telehealth: Payer: Self-pay | Admitting: Primary Care

## 2018-09-24 LAB — MYOSITIS PANEL III: RNP: 78.5 EU/ml — AB

## 2018-09-24 LAB — RNP ANTIBODIES: ENA RNP Ab: >8 AI — ABNORMAL HIGH (ref 0.0–0.9)

## 2018-09-24 NOTE — Telephone Encounter (Signed)
Dawne from [facility] calling with critical lab results from 09/18/2018. CKMB elevated at 154.0. Dawne notes several other abnormal labs drawn on this date.

## 2018-09-24 NOTE — Telephone Encounter (Signed)
Noted. Will close this message.  

## 2018-09-24 NOTE — Telephone Encounter (Signed)
No, I sent him a staff message but have not heard back. I called and spoke with the patient on Friday and let Sabrina Douglas know that we ordered pulmonary rehab and if Dr. Vassie Loll had any additional recommendations I would notify them.

## 2018-09-24 NOTE — Telephone Encounter (Signed)
Beth, have you been able to talk to Dr. Vassie Loll in regards to Ms. Bjelland?

## 2018-09-27 ENCOUNTER — Ambulatory Visit: Payer: Medicare Other | Admitting: Pulmonary Disease

## 2018-09-27 ENCOUNTER — Telehealth (HOSPITAL_COMMUNITY): Payer: Self-pay

## 2018-09-27 NOTE — Telephone Encounter (Signed)
Referral received from MD [facility] for Pulmonary Rehab with diagnosis of ILD. Clinical review of pt follow up appt on 09/18/18 Pulmonary office note with Ames Dura, NP and 08/28/18 with Dr. Vassie Loll. Pt appropriate for scheduling for Pulmonary rehab.  Will forward to support staff for scheduling and verification of insurance eligibility/benefits with pt consent.   Glade Nurse, RN, BSN Cardiac and Pulmonary Rehab Nurse

## 2018-09-29 ENCOUNTER — Encounter (HOSPITAL_COMMUNITY): Payer: Self-pay | Admitting: Emergency Medicine

## 2018-09-29 ENCOUNTER — Other Ambulatory Visit: Payer: Self-pay

## 2018-09-29 ENCOUNTER — Inpatient Hospital Stay (HOSPITAL_COMMUNITY)
Admission: EM | Admit: 2018-09-29 | Discharge: 2018-10-05 | DRG: 644 | Disposition: A | Discharge status: Home Health | Payer: Medicare Other | Attending: Internal Medicine | Admitting: Internal Medicine

## 2018-09-29 ENCOUNTER — Emergency Department (HOSPITAL_COMMUNITY): Payer: Medicare Other

## 2018-09-29 DIAGNOSIS — E440 Moderate protein-calorie malnutrition: Secondary | ICD-10-CM | POA: Diagnosis present

## 2018-09-29 DIAGNOSIS — R5381 Other malaise: Secondary | ICD-10-CM | POA: Diagnosis present

## 2018-09-29 DIAGNOSIS — E222 Syndrome of inappropriate secretion of antidiuretic hormone: Secondary | ICD-10-CM | POA: Diagnosis present

## 2018-09-29 DIAGNOSIS — R1312 Dysphagia, oropharyngeal phase: Secondary | ICD-10-CM | POA: Diagnosis present

## 2018-09-29 DIAGNOSIS — K210 Gastro-esophageal reflux disease with esophagitis: Secondary | ICD-10-CM | POA: Diagnosis present

## 2018-09-29 DIAGNOSIS — Z8249 Family history of ischemic heart disease and other diseases of the circulatory system: Secondary | ICD-10-CM | POA: Diagnosis not present

## 2018-09-29 DIAGNOSIS — J849 Interstitial pulmonary disease, unspecified: Secondary | ICD-10-CM | POA: Diagnosis not present

## 2018-09-29 DIAGNOSIS — Z6823 Body mass index (BMI) 23.0-23.9, adult: Secondary | ICD-10-CM

## 2018-09-29 DIAGNOSIS — E871 Hypo-osmolality and hyponatremia: Secondary | ICD-10-CM

## 2018-09-29 DIAGNOSIS — F419 Anxiety disorder, unspecified: Secondary | ICD-10-CM | POA: Diagnosis present

## 2018-09-29 DIAGNOSIS — R0602 Shortness of breath: Secondary | ICD-10-CM

## 2018-09-29 DIAGNOSIS — R627 Adult failure to thrive: Secondary | ICD-10-CM | POA: Diagnosis present

## 2018-09-29 DIAGNOSIS — R600 Localized edema: Secondary | ICD-10-CM | POA: Diagnosis present

## 2018-09-29 DIAGNOSIS — K222 Esophageal obstruction: Secondary | ICD-10-CM | POA: Diagnosis present

## 2018-09-29 DIAGNOSIS — R1310 Dysphagia, unspecified: Secondary | ICD-10-CM

## 2018-09-29 DIAGNOSIS — F329 Major depressive disorder, single episode, unspecified: Secondary | ICD-10-CM | POA: Diagnosis present

## 2018-09-29 DIAGNOSIS — E860 Dehydration: Secondary | ICD-10-CM | POA: Diagnosis present

## 2018-09-29 DIAGNOSIS — R740 Nonspecific elevation of levels of transaminase and lactic acid dehydrogenase [LDH]: Secondary | ICD-10-CM | POA: Diagnosis present

## 2018-09-29 DIAGNOSIS — E785 Hyperlipidemia, unspecified: Secondary | ICD-10-CM | POA: Diagnosis present

## 2018-09-29 DIAGNOSIS — I2729 Other secondary pulmonary hypertension: Secondary | ICD-10-CM | POA: Diagnosis present

## 2018-09-29 DIAGNOSIS — Z9119 Patient's noncompliance with other medical treatment and regimen: Secondary | ICD-10-CM | POA: Diagnosis not present

## 2018-09-29 DIAGNOSIS — I10 Essential (primary) hypertension: Secondary | ICD-10-CM | POA: Diagnosis present

## 2018-09-29 DIAGNOSIS — I110 Hypertensive heart disease with heart failure: Secondary | ICD-10-CM | POA: Diagnosis present

## 2018-09-29 DIAGNOSIS — I272 Pulmonary hypertension, unspecified: Secondary | ICD-10-CM

## 2018-09-29 DIAGNOSIS — Z800 Family history of malignant neoplasm of digestive organs: Secondary | ICD-10-CM

## 2018-09-29 DIAGNOSIS — I50810 Right heart failure, unspecified: Secondary | ICD-10-CM | POA: Diagnosis present

## 2018-09-29 DIAGNOSIS — Z79899 Other long term (current) drug therapy: Secondary | ICD-10-CM

## 2018-09-29 DIAGNOSIS — D72819 Decreased white blood cell count, unspecified: Secondary | ICD-10-CM | POA: Diagnosis present

## 2018-09-29 DIAGNOSIS — J84112 Idiopathic pulmonary fibrosis: Secondary | ICD-10-CM | POA: Diagnosis present

## 2018-09-29 LAB — COMPREHENSIVE METABOLIC PANEL
ALT: 41 U/L (ref 0–44)
AST: 83 U/L — ABNORMAL HIGH (ref 15–41)
Albumin: 3.7 g/dL (ref 3.5–5.0)
Alkaline Phosphatase: 73 U/L (ref 38–126)
Anion gap: 10 (ref 5–15)
BUN: 6 mg/dL — ABNORMAL LOW (ref 8–23)
CO2: 26 mmol/L (ref 22–32)
Calcium: 8.8 mg/dL — ABNORMAL LOW (ref 8.9–10.3)
Chloride: 78 mmol/L — ABNORMAL LOW (ref 98–111)
Creatinine, Ser: 0.43 mg/dL — ABNORMAL LOW (ref 0.44–1.00)
GFR calc Af Amer: >60 mL/min (ref >60)
GFR calc non Af Amer: >60 mL/min (ref >60)
Glucose, Bld: 120 mg/dL — ABNORMAL HIGH (ref 70–99)
Potassium: 4.7 mmol/L (ref 3.5–5.1)
Sodium: 114 mmol/L — CL (ref 135–145)
Total Bilirubin: 0.6 mg/dL (ref 0.3–1.2)
Total Protein: 7.6 g/dL (ref 6.5–8.1)

## 2018-09-29 LAB — URINALYSIS, ROUTINE W REFLEX MICROSCOPIC
Bacteria, UA: NONE SEEN
Bilirubin Urine: NEGATIVE
Glucose, UA: 50 mg/dL — AB
Hgb urine dipstick: NEGATIVE
Ketones, ur: 20 mg/dL — AB
Nitrite: NEGATIVE
Protein, ur: NEGATIVE mg/dL
Specific Gravity, Urine: 1.017 (ref 1.005–1.030)
pH: 7 (ref 5.0–8.0)

## 2018-09-29 LAB — BASIC METABOLIC PANEL
Anion gap: 8 (ref 5–15)
BUN: 5 mg/dL — ABNORMAL LOW (ref 8–23)
CO2: 26 mmol/L (ref 22–32)
Calcium: 8.2 mg/dL — ABNORMAL LOW (ref 8.9–10.3)
Chloride: 77 mmol/L — ABNORMAL LOW (ref 98–111)
Creatinine, Ser: 0.46 mg/dL (ref 0.44–1.00)
GFR calc Af Amer: >60 mL/min (ref >60)
GFR calc non Af Amer: >60 mL/min (ref >60)
Glucose, Bld: 125 mg/dL — ABNORMAL HIGH (ref 70–99)
Potassium: 4.1 mmol/L (ref 3.5–5.1)
Sodium: 111 mmol/L — CL (ref 135–145)

## 2018-09-29 LAB — TSH: TSH: 2.154 u[IU]/mL (ref 0.350–4.500)

## 2018-09-29 LAB — CBC WITH DIFFERENTIAL/PLATELET
Abs Immature Granulocytes: 0.02 10*3/uL (ref 0.00–0.07)
Basophils Absolute: 0 10*3/uL (ref 0.0–0.1)
Basophils Relative: 0 %
Eosinophils Absolute: 0 10*3/uL (ref 0.0–0.5)
Eosinophils Relative: 1 %
HCT: 40.6 % (ref 36.0–46.0)
Hemoglobin: 13.8 g/dL (ref 12.0–15.0)
Immature Granulocytes: 1 %
Lymphocytes Relative: 20 %
Lymphs Abs: 0.5 10*3/uL — ABNORMAL LOW (ref 0.7–4.0)
MCH: 30.7 pg (ref 26.0–34.0)
MCHC: 34 g/dL (ref 30.0–36.0)
MCV: 90.4 fL (ref 80.0–100.0)
Monocytes Absolute: 0.4 10*3/uL (ref 0.1–1.0)
Monocytes Relative: 15 %
Neutro Abs: 1.7 10*3/uL (ref 1.7–7.7)
Neutrophils Relative %: 63 %
Platelets: 331 10*3/uL (ref 150–400)
RBC: 4.49 MIL/uL (ref 3.87–5.11)
RDW: 11.1 % — ABNORMAL LOW (ref 11.5–15.5)
WBC: 2.7 10*3/uL — ABNORMAL LOW (ref 4.0–10.5)
nRBC: 0 % (ref 0.0–0.2)

## 2018-09-29 LAB — I-STAT TROPONIN, ED: Troponin i, poc: 0.02 ng/mL (ref 0.00–0.08)

## 2018-09-29 LAB — CREATININE, URINE, RANDOM: Creatinine, Urine: 76.62 mg/dL

## 2018-09-29 LAB — VITAMIN B12: Vitamin B-12: 375 pg/mL (ref 180–914)

## 2018-09-29 LAB — OSMOLALITY, URINE: Osmolality, Ur: 605 mOsm/kg (ref 300–900)

## 2018-09-29 LAB — MRSA PCR SCREENING: MRSA by PCR: NEGATIVE

## 2018-09-29 LAB — SODIUM, URINE, RANDOM: Sodium, Ur: 160 mmol/L

## 2018-09-29 LAB — FOLATE: Folate: 18.8 ng/mL (ref >5.9)

## 2018-09-29 MED ORDER — ONDANSETRON HCL 4 MG/2ML IJ SOLN
4.0000 mg | Freq: Four times a day (QID) | INTRAMUSCULAR | Status: DC | PRN
  Administered 2018-09-29: 4 mg via INTRAVENOUS
  Filled 2018-09-29: qty 2

## 2018-09-29 MED ORDER — SODIUM CHLORIDE 0.9% FLUSH: 3.0000 mL | INTRAVENOUS | Status: DC | PRN

## 2018-09-29 MED ORDER — SODIUM CHLORIDE 3 % IV SOLN
INTRAVENOUS | Status: DC
  Administered 2018-09-29 – 2018-09-30 (×3): 38 mL/h via INTRAVENOUS
  Filled 2018-09-29 (×3): qty 500

## 2018-09-29 MED ORDER — BOOST / RESOURCE BREEZE PO LIQD CUSTOM
1.0000 | Freq: Three times a day (TID) | ORAL | Status: DC
  Administered 2018-09-30 – 2018-10-04 (×6): 1 via ORAL

## 2018-09-29 MED ORDER — SODIUM CHLORIDE 0.9 % IV SOLN: 250.0000 mL | INTRAVENOUS | Status: DC | PRN

## 2018-09-29 MED ORDER — ACETAMINOPHEN 325 MG PO TABS: 650.0000 mg | ORAL_TABLET | Freq: Four times a day (QID) | ORAL | Status: DC | PRN

## 2018-09-29 MED ORDER — SODIUM CHLORIDE 0.9 % IV BOLUS
1000.0000 mL | Freq: Once | INTRAVENOUS | Status: AC
  Administered 2018-09-29: 1000 mL via INTRAVENOUS

## 2018-09-29 MED ORDER — METOPROLOL TARTRATE 25 MG PO TABS
25.0000 mg | ORAL_TABLET | Freq: Two times a day (BID) | ORAL | Status: DC
  Administered 2018-09-30 – 2018-10-04 (×9): 25 mg via ORAL
  Filled 2018-09-29 (×10): qty 1

## 2018-09-29 MED ORDER — ONDANSETRON HCL 4 MG PO TABS: 4.0000 mg | ORAL_TABLET | Freq: Four times a day (QID) | ORAL | Status: DC | PRN

## 2018-09-29 MED ORDER — HYDRALAZINE HCL 20 MG/ML IJ SOLN: 5.0000 mg | Freq: Four times a day (QID) | INTRAMUSCULAR | Status: DC | PRN

## 2018-09-29 MED ORDER — SODIUM CHLORIDE 0.9% FLUSH
3.0000 mL | Freq: Two times a day (BID) | INTRAVENOUS | Status: DC
  Administered 2018-09-29 – 2018-10-01 (×5): 3 mL via INTRAVENOUS

## 2018-09-29 MED ORDER — ALBUTEROL SULFATE (2.5 MG/3ML) 0.083% IN NEBU: 2.5000 mg | INHALATION_SOLUTION | RESPIRATORY_TRACT | Status: DC | PRN

## 2018-09-29 MED ORDER — ACETAMINOPHEN 650 MG RE SUPP: 650.0000 mg | Freq: Four times a day (QID) | RECTAL | Status: DC | PRN

## 2018-09-29 NOTE — ED Provider Notes (Signed)
[facility]-EMERGENCY DEPT Provider Note   CSN: [id number] Arrival date & time: 09/29/18  1221    History   Chief Complaint Chief Complaint  Patient presents with  . not eating or drinking    HPI Sabrina Douglas is a 78 y.o. female with a PMH of anxiety, depression, and HLD presenting due to loss of appetite and shortness of breath. Daughters are contributing historians. Daughter reports patient was diagnosed with interstitial lung disease in 05/2018 and has been progressively deteriorating in health. Daughter is concerned patient is dehydrated due to not eating or drinking enough. Daughter states patient has seen Dr. Vassie Loll from pulmonology and has an upcoming appointment scheduled next week. Patient reports shortness of breath with exertion and lightheadedness with standing. Patient denies fever, chest pain, abdominal pain, nausea, vomiting, or diarrhea. Patient reports generalized weakness, but denies falls or syncope. Patient reports an intermittent dry cough for months. Daughter reports she had thrush a few weeks ago and it has resolved, but she is concerned about thrush on patient's throat. Patient has had an intermittent sore throat. Patient reports dysphagia and loss of appetite. Patient reports a 24 lbs weight loss. Patient reports leg edema bilaterally. Patient states she is able to ambulate. Daughter reports patient lives by herself.     HPI  Past Medical History:  Diagnosis Date  . Anxiety   . Depression   . Hyperlipidemia     Patient Active Problem List   Diagnosis Date Noted  . ILD (interstitial lung disease) (HCC) 09/18/2018  . Pulmonary infiltrates on CXR 08/28/2018    History reviewed. No pertinent surgical history.   OB History   No obstetric history on file.      Home Medications    Prior to Admission medications   Medication Sig Start Date End Date Taking? Authorizing Provider  albuterol (PROVENTIL HFA;VENTOLIN HFA) 108 (90 Base)  MCG/ACT inhaler Inhale 2 puffs into the lungs every 6 (six) hours as needed for wheezing or shortness of breath. 09/18/18  Yes Glenford Bayley, NP  ALPRAZolam Prudy Feeler) 0.25 MG tablet Take 0.125-0.25 mg by mouth 2 (two) times daily as needed for anxiety.    Yes [provider]  citalopram (CELEXA) 10 MG/5ML suspension Take 10 mg by mouth daily.   Yes [provider]  Glycerin-Hypromellose-PEG 400 ([facility] DRY EYE RELIEF) 0.2-0.2-1 % SOLN Place 2 drops into both eyes as needed (Dry eyes).   Yes [provider]  metoprolol tartrate (LOPRESSOR) 25 MG tablet Take 25 mg by mouth 2 (two) times daily.   Yes [provider]  Multiple Vitamin (MULTIVITAMIN WITH MINERALS) TABS tablet Take 1 tablet by mouth daily.   Yes [provider]  simvastatin (ZOCOR) 20 MG tablet Take 20 mg by mouth daily.   Yes [provider]  spironolactone (ALDACTONE) 25 MG tablet Take 25 mg by mouth daily.   Yes [provider]  nystatin (MYCOSTATIN) 100000 UNIT/ML suspension Take 5 mLs (500,000 Units total) by mouth 4 (four) times daily. For 1 week or until white plaque on tongue resolves Patient not taking: Reported on 09/29/2018 09/04/18   Little, Ambrose Finland, MD  predniSONE (DELTASONE) 10 MG tablet Take 4 tabs po daily x 2 days; then 3 tabs for 2 days; then 2 tabs for 2 days; then 1 tab for 2 days Patient not taking: Reported on 09/29/2018 09/18/18   Glenford Bayley, NP  Respiratory Therapy Supplies (FLUTTER) DEVI 1 Device by Does not apply route as  needed. 09/19/18   Glenford Bayley, NP    Family History No family history on file.  Social History Social History   Tobacco Use  . Smoking status: Never Smoker  . Smokeless tobacco: Never Used  Substance Use Topics  . Alcohol use: Never    Frequency: Never  . Drug use: Not on file     Allergies   Patient has no known allergies.   Review of Systems Review of Systems  Constitutional: Positive for  activity change, appetite change and unexpected weight change. Negative for chills, diaphoresis and fever.  HENT: Positive for sore throat and trouble swallowing. Negative for congestion and rhinorrhea.   Eyes: Negative for visual disturbance.  Respiratory: Positive for cough and shortness of breath.   Cardiovascular: Positive for leg swelling. Negative for chest pain.  Gastrointestinal: Negative for abdominal pain, nausea and vomiting.  Endocrine: Negative for cold intolerance and heat intolerance.  Genitourinary: Negative for difficulty urinating, dysuria and frequency.  Musculoskeletal: Negative for back pain.  Skin: Negative for rash.  Neurological: Positive for dizziness, weakness and light-headedness. Negative for syncope, facial asymmetry, speech difficulty, numbness and headaches.  Hematological: Negative for adenopathy.     Physical Exam Updated Vital Signs BP (!) 151/78 (BP Location: Left Arm)   Pulse 79   Temp 98 F (36.7 C) (Oral)   Resp (!) 31   SpO2 98%   Physical Exam Vitals signs and nursing note reviewed.  Constitutional:      General: She is not in acute distress.    Appearance: She is well-developed. She is not diaphoretic.  HENT:     Head: Normocephalic and atraumatic.     Right Ear: Tympanic membrane, ear canal and external ear normal.     Left Ear: Tympanic membrane, ear canal and external ear normal.     Nose: Nose normal. No congestion or rhinorrhea.     Mouth/Throat:     Mouth: Mucous membranes are dry.     Pharynx: No oropharyngeal exudate or posterior oropharyngeal erythema.  Eyes:     Extraocular Movements: Extraocular movements intact.     Conjunctiva/sclera: Conjunctivae normal.     Pupils: Pupils are equal, round, and reactive to light.  Neck:     Musculoskeletal: Normal range of motion and neck supple.  Cardiovascular:     Rate and Rhythm: Normal rate and regular rhythm.     Heart sounds: Normal heart sounds. No murmur. No friction rub. No  gallop.   Pulmonary:     Effort: Pulmonary effort is normal. No respiratory distress.     Breath sounds: Normal breath sounds. No wheezing or rales.  Abdominal:     Palpations: Abdomen is soft.     Tenderness: There is no abdominal tenderness.  Musculoskeletal: Normal range of motion.  Skin:    General: Skin is warm.     Findings: No erythema or rash.  Neurological:     Mental Status: She is alert and oriented to person, place, and time.    Mental Status:  Alert, oriented, thought content appropriate, able to give a coherent history. Speech fluent without evidence of aphasia. Able to follow 2 step commands without difficulty.  Cranial Nerves:  II:  Peripheral visual fields grossly normal, pupils equal, round, reactive to light III,IV, VI: ptosis not present, extra-ocular motions intact bilaterally  V,VII: smile symmetric, facial light touch sensation equal VIII: hearing grossly normal to voice  X: uvula elevates symmetrically  XI: bilateral shoulder shrug symmetric and strong XII:  midline tongue extension without fassiculations Motor:  Normal tone. 5/5 in upper and lower extremities bilaterally including strong and equal grip strength and dorsiflexion/plantar flexion Sensory: light touch normal in all extremities.  Deep Tendon Reflexes: 2+ and symmetric in the biceps and patella Cerebellar: normal finger-to-nose with bilateral upper extremities Gait: slow gait and normal balance.  CV: distal pulses palpable throughout    ED Treatments / Results  Labs (all labs ordered are listed, but only abnormal results are displayed) Labs Reviewed  COMPREHENSIVE METABOLIC PANEL - Abnormal; Notable for the following components:      Result Value   Sodium 114 (*)    Chloride 78 (*)    Glucose, Bld 120 (*)    BUN 6 (*)    Creatinine, Ser 0.43 (*)    Calcium 8.8 (*)    AST 83 (*)    All other components within normal limits  CBC WITH DIFFERENTIAL/PLATELET - Abnormal; Notable for the  following components:   WBC 2.7 (*)    RDW 11.1 (*)    Lymphs Abs 0.5 (*)    All other components within normal limits  URINALYSIS, ROUTINE W REFLEX MICROSCOPIC  OSMOLALITY, URINE  SODIUM, URINE, RANDOM  CREATININE, URINE, RANDOM  I-STAT TROPONIN, ED    EKG None  Radiology Dg Chest 2 View  Result Date: 09/29/2018 CLINICAL DATA:  Shortness of breath. EXAM: CHEST - 2 VIEW COMPARISON:  Chest x-ray dated September 04, 2018. FINDINGS: The heart size and mediastinal contours are within normal limits. Normal pulmonary vascularity. Unchanged interstitial opacities at the left greater than right lung bases. No focal consolidation, pleural effusion, or pneumothorax. No acute osseous abnormality. Distended stomach. IMPRESSION: 1. Unchanged bibasilar interstitial lung disease.  No acute finding. Electronically Signed   By: Obie Dredge M.D.   On: 09/29/2018 14:46    Procedures Procedures (including critical care time)  Medications Ordered in ED Medications  sodium chloride 0.9 % bolus 1,000 mL (1,000 mLs Intravenous New Bag/Given 09/29/18 1536)     Initial Impression / Assessment and Plan / ED Course  I have reviewed the triage vital signs and the nursing notes.  Pertinent labs & imaging results that were available during my care of the patient were reviewed by me and considered in my medical decision making (see chart for details).  Clinical Course as of Sep 29 1624  Sat Sep 29, 2018  1451 Unchanged bibasilar interstitial lung disease.  No acute findings noted on CXR.  DG Chest 2 View [AH]  1516 Hyponatremia noted at 114. Will start IVF.  Sodium(!!): 114 [AH]    Clinical Course User Index [AH] Leretha Dykes, PA-C      Patient presents with loss of appetite and shortness of breath. Patient has unchanged bibasilar interstitial lung disease on CXR. Severe hyponatremia noted at 114. Provided IVF. Concerned for esophageal cancer due to weight loss, dysphagia, and loss of appetite.  Consulted hospitalist for admission. Hospitalist has agreed to admit patient.   Findings and plan of care discussed with supervising physician Dr. Patria Mane who personally evaluated and examined this patient.  Final Clinical Impressions(s) / ED Diagnoses   Final diagnoses:  Hyponatremia  Dysphagia, unspecified type  Shortness of breath    ED Discharge Orders    None       Glade Stanford 09/29/18 1633    Azalia Bilis, MD 09/29/18 1700

## 2018-09-29 NOTE — ED Notes (Signed)
EDP in with pt, at the moment unable to complete orthostatics at the moment. Pt and family would prefer orthostatics be completed before fluids.

## 2018-09-29 NOTE — ED Notes (Signed)
ED TO INPATIENT HANDOFF REPORT  Name/Age/Gender Sabrina Douglas 78 y.o. female  Code Status Advance Directive Documentation     Most Recent Value  Type of Advance Directive  Healthcare Power of Attorney, Living will  Pre-existing out of facility DNR order (yellow form or pink MOST form)  -  "MOST" Form in Place?  -      Home/SNF/Other Home  Chief Complaint dehydrated,dizzy,no fluids,unable to walk  Level of Care/Admitting Diagnosis ED Disposition    ED Disposition Condition Comment   Admit  Hospital Area: [facility] [[id number]]  Level of Care: Stepdown [14]  Admit to SDU based on following criteria: Other see comments  Comments: severe hyponatremia  Diagnosis: Hyponatremia [161096]  Admitting Physician: Osvaldo Shipper [[id number]]  Attending Physician: Osvaldo Shipper [[id number]]  Estimated length of stay: past midnight tomorrow  Certification:: I certify this patient will need inpatient services for at least 2 midnights  PT Class (Do Not Modify): Inpatient [101]  PT Acc Code (Do Not Modify): Private [1]       Medical History Past Medical History:  Diagnosis Date  . Anxiety   . Depression   . Hyperlipidemia     Allergies No Known Allergies  IV Location/Drains/Wounds Patient Lines/Drains/Airways Status   Active Line/Drains/Airways    Name:   Placement date:   Placement time:   Site:   Days:   Peripheral IV 09/29/18 Right Antecubital   09/29/18    1432    Antecubital   less than 1          Labs/Imaging Results for orders placed or performed during the hospital encounter of 09/29/18 (from the past 48 hour(s))  Comprehensive metabolic panel     Status: Abnormal   Collection Time: 09/29/18  2:34 PM  Result Value Ref Range   Sodium 114 (LL) 135 - 145 mmol/L    Comment: CRITICAL RESULT CALLED TO, READ BACK BY AND VERIFIED WITH: B.JESSE,RN [id number]  BY V.WILKINS    Potassium 4.7 3.5 - 5.1 mmol/L   Chloride 78 (L) 98 - 111 mmol/L   CO2 26 22 - 32  mmol/L   Glucose, Bld 120 (H) 70 - 99 mg/dL   BUN 6 (L) 8 - 23 mg/dL   Creatinine, Ser 8.11 (L) 0.44 - 1.00 mg/dL   Calcium 8.8 (L) 8.9 - 10.3 mg/dL   Total Protein 7.6 6.5 - 8.1 g/dL   Albumin 3.7 3.5 - 5.0 g/dL   AST 83 (H) 15 - 41 U/L   ALT 41 0 - 44 U/L   Alkaline Phosphatase 73 38 - 126 U/L   Total Bilirubin 0.6 0.3 - 1.2 mg/dL   GFR calc non Af Amer >60 >60 mL/min   GFR calc Af Amer >60 >60 mL/min   Anion gap 10 5 - 15    Comment: Performed at [facility], [address] [place], [place] [address]  CBC with Differential     Status: Abnormal   Collection Time: 09/29/18  2:34 PM  Result Value Ref Range   WBC 2.7 (L) 4.0 - 10.5 K/uL   RBC 4.49 3.87 - 5.11 MIL/uL   Hemoglobin 13.8 12.0 - 15.0 g/dL   HCT 29.5 62.1 - 30.8 %   MCV 90.4 80.0 - 100.0 fL   MCH 30.7 26.0 - 34.0 pg   MCHC 34.0 30.0 - 36.0 g/dL   RDW 65.7 (L) 84.6 - 96.2 %   Platelets 331 150 - 400 K/uL   nRBC 0.0  0.0 - 0.2 %   Neutrophils Relative % 63 %   Neutro Abs 1.7 1.7 - 7.7 K/uL   Lymphocytes Relative 20 %   Lymphs Abs 0.5 (L) 0.7 - 4.0 K/uL   Monocytes Relative 15 %   Monocytes Absolute 0.4 0.1 - 1.0 K/uL   Eosinophils Relative 1 %   Eosinophils Absolute 0.0 0.0 - 0.5 K/uL   Basophils Relative 0 %   Basophils Absolute 0.0 0.0 - 0.1 K/uL   Immature Granulocytes 1 %   Abs Immature Granulocytes 0.02 0.00 - 0.07 K/uL    Comment: Performed at [facility], [address], [place], [place] [address]  I-Stat Troponin, ED (not at [facility])     Status: None   Collection Time: 09/29/18  2:39 PM  Result Value Ref Range   Troponin i, poc 0.02 0.00 - 0.08 ng/mL   Comment 3            Comment: Due to the release kinetics of cTnI, a negative result within the first hours of the onset of symptoms does not rule out myocardial infarction with certainty. If myocardial infarction is still suspected, repeat the test at appropriate intervals.   Sodium, urine, random     Status: None    Collection Time: 09/29/18  4:01 PM  Result Value Ref Range   Sodium, Ur 160 mmol/L    Comment: Performed at [facility], [address] [place], [place] [address]  Creatinine, urine, random     Status: None   Collection Time: 09/29/18  4:01 PM  Result Value Ref Range   Creatinine, Urine 76.62 mg/dL    Comment: Performed at [facility], [address], [place], [place] [address]   Dg Chest 2 View  Result Date: 09/29/2018 CLINICAL DATA:  Shortness of breath. EXAM: CHEST - 2 VIEW COMPARISON:  Chest x-ray dated September 04, 2018. FINDINGS: The heart size and mediastinal contours are within normal limits. Normal pulmonary vascularity. Unchanged interstitial opacities at the left greater than right lung bases. No focal consolidation, pleural effusion, or pneumothorax. No acute osseous abnormality. Distended stomach. IMPRESSION: 1. Unchanged bibasilar interstitial lung disease.  No acute finding. Electronically Signed   By: Obie Dredge M.D.   On: 09/29/2018 14:46   EKG Interpretation  Date/Time:  Saturday September 29 2018 15:27:21 EST Ventricular Rate:  88 PR Interval:    QRS Duration: 91 QT Interval:  327 QTC Calculation: 396 R Axis:   -36 Text Interpretation:  Sinus rhythm Prominent P waves, nondiagnostic Abnormal R-wave progression, early transition LVH with secondary repolarization abnormality Anterior Q waves, possibly due to LVH Baseline wander in lead(s) V2 No significant change was found Confirmed by Azalia Bilis ([id number]) on 09/29/2018 4:59:31 PM   Pending Labs Unresulted Labs (From admission, onward)    Start     Ordered   09/29/18 1709  Basic metabolic panel  ONCE - STAT,   R     09/29/18 1708   09/29/18 1551  Osmolality, urine  Once,   R     09/29/18 1550   09/29/18 1414  Urinalysis, Routine w reflex microscopic  ONCE - STAT,   STAT     09/29/18 1414          Vitals/Pain Today's Vitals   09/29/18 1228 09/29/18 1234 09/29/18  1529  BP: 139/67  (!) 151/78  Pulse: 82  79  Resp: 18  (!) 31  Temp: 98 F (36.7 C)  TempSrc: Oral    SpO2: 97%  98%  PainSc:  0-No pain     Isolation Precautions No active isolations  Medications Medications  sodium chloride 0.9 % bolus 1,000 mL (1,000 mLs Intravenous New Bag/Given 09/29/18 1536)    Mobility walks

## 2018-09-29 NOTE — ED Notes (Signed)
Date and time results received: 09/29/18 1515 (use smartphrase ".now" to insert current time)  Test: sodium Critical Value: 114  Name of Provider Notified: Carlyle Basques  Orders Received? Or Actions Taken?: Actions Taken: notified Ana that sodium 114.

## 2018-09-29 NOTE — H&P (Addendum)
[facility] History and Physical  [place]:[id number] DOB: 1941/03/20 DOA: 09/29/2018   PCP: Renaye Rakers, MD  Specialists: Dr. Vassie Loll with pulmonology  Chief Complaint: Difficulty swallowing.  Generalized weakness.  HPI: [place] is a 78 y.o. female with a past medical history of essential hypertension, chronic lower extremity edema who is under evaluation by pulmonology for interstitial lung disease diagnosed recently.  She is supposed to have a follow-up appointment with her pulmonologist next week to go over the results of the studies ordered at the last visit.  Patient also mentions that she had was given a course of steroids 3 weeks ago for respiratory symptoms and as a result of which she developed oral thrush.  She was given a course of Diflucan.  The thrush improved but the patient continued to have difficulty swallowing.  More so to solids than liquids.  She feels that whenever she tries to eat something she has to wash it down with water and then she is able to swallow.  She has not had any regurgitation yet.  She denies any previous history of same.  She is lost about 20 or pounds in the last few months.  Due to her depressed mood she was started on citalopram and alprazolam on February 7 by her primary care provider.  She has not been compliant with her Spironolactone the last 2 weeks.  She denies any headaches.  No seizure type activity.  No dizziness or lightheadedness.  No syncopal episodes.  No fever or chills.  Evaluation in the emergency department revealed severe hyponatremia with a sodium level of 114.  She was hospitalized for further management.  Home Medications: Prior to Admission medications   Medication Sig Start Date End Date Taking? Authorizing Provider  albuterol (PROVENTIL HFA;VENTOLIN HFA) 108 (90 Base) MCG/ACT inhaler Inhale 2 puffs into the lungs every 6 (six) hours as needed for wheezing or shortness of breath. 09/18/18  Yes Glenford Bayley,  NP  ALPRAZolam Prudy Feeler) 0.25 MG tablet Take 0.125-0.25 mg by mouth 2 (two) times daily as needed for anxiety.    Yes [provider]  citalopram (CELEXA) 10 MG/5ML suspension Take 10 mg by mouth daily.   Yes [provider]  Glycerin-Hypromellose-PEG 400 ([facility] DRY EYE RELIEF) 0.2-0.2-1 % SOLN Place 2 drops into both eyes as needed (Dry eyes).   Yes [provider]  metoprolol tartrate (LOPRESSOR) 25 MG tablet Take 25 mg by mouth 2 (two) times daily.   Yes [provider]  Multiple Vitamin (MULTIVITAMIN WITH MINERALS) TABS tablet Take 1 tablet by mouth daily.   Yes [provider]  simvastatin (ZOCOR) 20 MG tablet Take 20 mg by mouth daily.   Yes [provider]  spironolactone (ALDACTONE) 25 MG tablet Take 25 mg by mouth daily.   Yes [provider]  nystatin (MYCOSTATIN) 100000 UNIT/ML suspension Take 5 mLs (500,000 Units total) by mouth 4 (four) times daily. For 1 week or until white plaque on tongue resolves Patient not taking: Reported on 09/29/2018 09/04/18   Little, Ambrose Finland, MD  predniSONE (DELTASONE) 10 MG tablet Take 4 tabs po daily x 2 days; then 3 tabs for 2 days; then 2 tabs for 2 days; then 1 tab for 2 days Patient not taking: Reported on 09/29/2018 09/18/18   Glenford Bayley, NP  Respiratory Therapy Supplies (FLUTTER) DEVI 1 Device by Does not apply route as needed. 09/19/18   Glenford Bayley, NP    Allergies: No  Known Allergies  Past Medical History: Past Medical History:  Diagnosis Date  . Anxiety   . Depression   . Hyperlipidemia     History reviewed. No pertinent surgical history.  Social History:   [place] in [place]  No history of smoking or alcohol use.   Family History: Hypertension  Review of Systems - History obtained from the patient General ROS: positive for  - fatigue Psychological ROS: negative Ophthalmic ROS: negative ENT ROS: negative Allergy and Immunology ROS:  negative Hematological and Lymphatic ROS: negative Endocrine ROS: negative Respiratory ROS: positive for - shortness of breath Cardiovascular ROS: no chest pain or dyspnea on exertion Gastrointestinal ROS: as in hpi Genito-Urinary ROS: Has been urinating less than usual Musculoskeletal ROS: negative Neurological ROS: no TIA or stroke symptoms Dermatological ROS: negative  Physical Examination  Vitals:   09/29/18 1228 09/29/18 1529  BP: 139/67 (!) 151/78  Pulse: 82 79  Resp: 18 20  Temp: 98 F (36.7 C)   TempSrc: Oral   SpO2: 97% 98%    BP (!) 151/78 (BP Location: Left Arm)   Pulse 79   Temp 98 F (36.7 C) (Oral)   Resp 20   SpO2 98%   General appearance: alert, cooperative, appears stated age and no distress Head: Normocephalic, without obvious abnormality, atraumatic Eyes: conjunctivae/corneas clear. PERRL, EOM's intact.  Throat: lips, mucosa, and tongue normal; teeth and gums normal Neck: no adenopathy, no carotid bruit, no JVD, supple, symmetrical, trachea midline and thyroid not enlarged, symmetric, no tenderness/mass/nodules Resp: Crackles bilateral bases.  Mildly tachypneic.  No use of accessory muscles.  No wheezing or rhonchi. Cardio: regular rate and rhythm, S1, S2 normal, no murmur, click, rub or gallop GI: soft, non-tender; bowel sounds normal; no masses,  no organomegaly Extremities: Minimal edema bilateral lower extremities. Pulses: 2+ and symmetric Skin: Skin color, texture, turgor normal. No rashes or lesions Lymph nodes: Cervical, supraclavicular, and axillary nodes normal. Neurologic: Alert and oriented x3.  No focal neurological deficits.    Labs on Admission: I have personally reviewed following labs and imaging studies  CBC: Recent Labs  Lab 09/29/18 1434  WBC 2.7*  NEUTROABS 1.7  HGB 13.8  HCT 40.6  MCV 90.4  PLT 331   Basic Metabolic Panel: Recent Labs  Lab 09/29/18 1434  NA 114*  K 4.7  CL 78*  CO2 26  GLUCOSE 120*  BUN 6*   CREATININE 0.43*  CALCIUM 8.8*   GFR: Estimated Creatinine Clearance: 51.3 mL/min (A) (by C-G formula based on SCr of 0.43 mg/dL (L)). Liver Function Tests: Recent Labs  Lab 09/29/18 1434  AST 83*  ALT 41  ALKPHOS 73  BILITOT 0.6  PROT 7.6  ALBUMIN 3.7     Radiological Exams on Admission: Dg Chest 2 View  Result Date: 09/29/2018 CLINICAL DATA:  Shortness of breath. EXAM: CHEST - 2 VIEW COMPARISON:  Chest x-ray dated September 04, 2018. FINDINGS: The heart size and mediastinal contours are within normal limits. Normal pulmonary vascularity. Unchanged interstitial opacities at the left greater than right lung bases. No focal consolidation, pleural effusion, or pneumothorax. No acute osseous abnormality. Distended stomach. IMPRESSION: 1. Unchanged bibasilar interstitial lung disease.  No acute finding. Electronically Signed   By: Obie Dredge M.D.   On: 09/29/2018 14:46    My interpretation of Electrocardiogram: Sinus rhythm.  Left axis deviation.  LVH noted.  Nonspecific T wave changes.   Problem List  Principal Problem:   Hyponatremia Active Problems:   ILD (interstitial lung  disease) [facility])   Essential hypertension   Lower extremity edema   Dysphagia   Assessment: This is a 78 year old African-American female with past medical history as stated earlier who presents with failure to thrive, fatigue, difficulty swallowing.  She is found to have severe hyponatremia with a sodium level of 114.  She was started on citalopram on 2/7.  She is being evaluated for interstitial lung disease.  Reason for low sodium level likely multifactorial including hypovolemia.  SIADH could also be contributing considering her lung issues as well as recently started citalopram.  Plan:  1. Severe hyponatremia: She appears to be asymptomatic.  Does not have any mental status changes.  No seizure activity.  She was given a liter of normal saline in the emergency department.  Urine studies have been  ordered.  Discussed with Dr. Arlean Hopping with nephrology who will consult.  He recommends repeating another sodium level before initiating any further IV fluids.  UA and urine osmolality pending.  TSH will be ordered.  Cortisol level.  2. Dysphagia: No oral lesions noted on examination today.  Her history is more suggestive of an esophageal component rather than oropharyngeal.  Proceed with barium swallow.  3. Leukopenia: Etiology unclear.  Check B12 levels.  Folate levels.  Could be due to medications.  4. Interstitial lung disease: Patient being evaluated by pulmonology.  Autoimmune work-up is being pursued.  She has an appointment with Dr. Vassie Loll next week.  Noted to be dyspneic at this time.  Saturating normal on room air.  5.  Essential hypertension: Hold her Spironolactone.  Continue metoprolol.  6.  Chronic lower extremity edema: No significant edema noted.  Hold Spironolactone.   DVT Prophylaxis: SCDs Code Status: Full code Family Communication: Discussed with the patient and her daughters Disposition: PT and OT to see.  Hopefully back home when improved Consults called: Nephrology Admission Status: Inpatient  Severity of Illness: The appropriate patient status for this patient is INPATIENT. Inpatient status is judged to be reasonable and necessary in order to provide the required intensity of service to ensure the patient's safety. The patient's presenting symptoms, physical exam findings, and initial radiographic and laboratory data in the context of their chronic comorbidities is felt to place them at high risk for further clinical deterioration. Furthermore, it is not anticipated that the patient will be medically stable for discharge from the hospital within 2 midnights of admission. The following factors support the patient status of inpatient.   " The patient's presenting symptoms include fatigue, poor oral intake. " The worrisome physical exam findings include dehydration, crackles  in the lung. " The initial radiographic and laboratory data are worrisome because of severe hyponatremia. " The chronic co-morbidities include essential hypertension.   * I certify that at the point of admission it is my clinical judgment that the patient will require inpatient hospital care spanning beyond 2 midnights from the point of admission due to high intensity of service, high risk for further deterioration and high frequency of surveillance required.*    Further management decisions will depend on results of further testing and patient's response to treatment.   Abbie Jablon [facility]  [facility] on [email or web address]  09/29/2018, 5:43 PM

## 2018-09-29 NOTE — Progress Notes (Signed)
CRITICAL VALUE ALERT  Critical Value:  Na 111  Date & Time Notied:  09/29/18 @ 18:02pm  Provider Notified: [facility]  Orders Received/Actions taken: MD paged, waiting on orders

## 2018-09-29 NOTE — ED Triage Notes (Signed)
Pt was dehydrated when had labs at PCP week and half ago, hasnt heard back on labs that had done couple days ago.pt continuing to not eat or drink and loosing weight since October.  Pt was diagnosed with interstitial lung disease and waiting to see Pulmonologist this coming Thursday for follow up.

## 2018-09-29 NOTE — Consult Note (Addendum)
Renal Service Consult Note [facility]  LILLION ORCUTT 09/29/2018 Maree Krabbe Requesting Physician:  Dr Barnie Del  Reason for Consult:  Hyponatremia HPI: The patient is a 78 y.o. year-old with hx of HL, depression, anxiety and recently undergoing W/U per pulm (Dr Vassie Loll) for abnormal CXR's w/ cough and ambulatory hypoxemia w/ suspected interstitial lung disease over the past few months. CT chest showed fibrotic changes at the bases. 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 Prednisone course taken w/ some improvement.  She has also had thrush recently , recurrent issue, started on SSRI citalopram in the past week and also takes metoprolol and aldactone at home but hasn't taken the aldcatone for last 2 wks.  Per H&P she has chronic LE edema as well.    Daughter provides the history.  No n/v or diarrhea recently, eating but not a lot, mostly liquids over the past month or so.  Pt has gotten really tired here lately and is unable to get OOB now.  No confusion or jerking.   ROS  denies CP  no joint pain   no HA  no blurry vision  no rash    Past Medical History  Past Medical History:  Diagnosis Date  . Anxiety   . Depression   . Hyperlipidemia    Past Surgical History History reviewed. No pertinent surgical history. Family History No family history on file. Social History  reports that she has never smoked. She has never used smokeless tobacco. She reports that she does not drink alcohol. No history on file for drug. Allergies No Known Allergies Home medications Prior to Admission medications   Medication Sig Start Date End Date Taking? Authorizing Provider  albuterol (PROVENTIL HFA;VENTOLIN HFA) 108 (90 Base) MCG/ACT inhaler Inhale 2 puffs into the lungs every 6 (six) hours as needed for wheezing or shortness of breath. 09/18/18  Yes Glenford Bayley, NP  ALPRAZolam Prudy Feeler) 0.25 MG tablet Take 0.125-0.25 mg by mouth 2 (two) times daily as needed for anxiety.    Yes  [provider]  citalopram (CELEXA) 10 MG/5ML suspension Take 10 mg by mouth daily.   Yes [provider]  Glycerin-Hypromellose-PEG 400 ([facility] DRY EYE RELIEF) 0.2-0.2-1 % SOLN Place 2 drops into both eyes as needed (Dry eyes).   Yes [provider]  metoprolol tartrate (LOPRESSOR) 25 MG tablet Take 25 mg by mouth 2 (two) times daily.   Yes [provider]  Multiple Vitamin (MULTIVITAMIN WITH MINERALS) TABS tablet Take 1 tablet by mouth daily.   Yes [provider]  simvastatin (ZOCOR) 20 MG tablet Take 20 mg by mouth daily.   Yes [provider]  spironolactone (ALDACTONE) 25 MG tablet Take 25 mg by mouth daily.   Yes [provider]  nystatin (MYCOSTATIN) 100000 UNIT/ML suspension Take 5 mLs (500,000 Units total) by mouth 4 (four) times daily. For 1 week or until white plaque on tongue resolves Patient not taking: Reported on 09/29/2018 09/04/18   Little, Ambrose Finland, MD  predniSONE (DELTASONE) 10 MG tablet Take 4 tabs po daily x 2 days; then 3 tabs for 2 days; then 2 tabs for 2 days; then 1 tab for 2 days Patient not taking: Reported on 09/29/2018 09/18/18   Glenford Bayley, NP  Respiratory Therapy Supplies (FLUTTER) DEVI 1 Device by Does not apply route as needed. 09/19/18   Glenford Bayley, NP   Liver Function Tests Recent Labs  Lab 09/29/18 1434  AST 83*  ALT 41  ALKPHOS 73  BILITOT 0.6  PROT 7.6  ALBUMIN 3.7   No results for input(s): LIPASE, AMYLASE in the last 168 hours. CBC Recent Labs  Lab 09/29/18 1434  WBC 2.7*  NEUTROABS 1.7  HGB 13.8  HCT 40.6  MCV 90.4  PLT 331   Basic Metabolic Panel Recent Labs  Lab 09/29/18 1434 09/29/18 1823  NA 114* 111*  K 4.7 4.1  CL 78* 77*  CO2 26 26  GLUCOSE 120* 125*  BUN 6* 5*  CREATININE 0.43* 0.46  CALCIUM 8.8* 8.2*   Iron/TIBC/Ferritin/ %Sat No results found for: IRON, TIBC, FERRITIN, IRONPCTSAT  Vitals:   09/29/18 1529 09/29/18 1744 09/29/18 1751  09/29/18 1800  BP: (!) 151/78 119/74  (!) 146/85  Pulse: 79 96  91  Resp: 20 (!) 32  (!) 33  Temp:   (!) 97.5 F (36.4 C)   TempSrc:   Axillary   SpO2: 98% 96%  99%  Weight:  67.4 kg    Height:  5\' 5"  (1.651 m)     Exam Gen pt is lethargic, but responsive and appropriate and Ox 3, tired No rash, cyanosis or gangrene Sclera anicteric, throat clear and moist  No jvd or bruits, JVP 8 Chest clear bilat to bases no rales or wheezing RRR no MRG Abd soft ntnd no mass or ascites +bs GU purewick w/o urine forthcoming yet MS no joint effusions or deformity Ext 1+ ankle/ pedal edema, no other edema Neuro is lethargic, Ox 3, and nonfocal    Home meds:  - metoprolol 25 mg bid/ spironolactone 25 qd  - simvastatin 20 qd  - alprazolam prn bid/ citalopram 10 qd  - prednisone taper 40mg  > 10mg  over 8 days  - nystatin swish /swallow  - vitamins/ prns    Na+ 114 > 111 after NS bolus   K 4.1  CO2 26  BUN 5  Cr 0.46  Ca 8.2   WBC 2K Hb 13 ptl wnl   UNa 160 UOsm pending   Assessment: 1. Hyponatremia - euvolemic to slightly vol expanded. Got worse w/ NS bolus.  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 She is symptomatic w/ severe fatigue.  Recommend acute Rx w/ 3% saline protocol.  Hold SSRi and aldactone for now. NO further IVF's or bolus fluids.  Get ECHO.  Will follow.  2. Interstitial lung disease - recent dx, fibrotic lung changes, cough.  3. HTN taking BB and aldactone 4. Depression / anxiety    P: 1.  as above.       [facility] 09/29/2018, 7:25 PM

## 2018-09-30 ENCOUNTER — Inpatient Hospital Stay (HOSPITAL_COMMUNITY): Payer: Medicare Other

## 2018-09-30 DIAGNOSIS — R0602 Shortness of breath: Secondary | ICD-10-CM

## 2018-09-30 DIAGNOSIS — D72819 Decreased white blood cell count, unspecified: Secondary | ICD-10-CM

## 2018-09-30 LAB — BASIC METABOLIC PANEL
Anion gap: 7 (ref 5–15)
BUN: <5 mg/dL — ABNORMAL LOW (ref 8–23)
CO2: 26 mmol/L (ref 22–32)
Calcium: 8.1 mg/dL — ABNORMAL LOW (ref 8.9–10.3)
Chloride: 84 mmol/L — ABNORMAL LOW (ref 98–111)
Creatinine, Ser: 0.42 mg/dL — ABNORMAL LOW (ref 0.44–1.00)
GFR calc Af Amer: >60 mL/min (ref >60)
GFR calc non Af Amer: >60 mL/min (ref >60)
Glucose, Bld: 121 mg/dL — ABNORMAL HIGH (ref 70–99)
Potassium: 4.1 mmol/L (ref 3.5–5.1)
Sodium: 117 mmol/L — CL (ref 135–145)

## 2018-09-30 LAB — SODIUM
Sodium: 114 mmol/L — CL (ref 135–145)
Sodium: 117 mmol/L — CL (ref 135–145)
Sodium: 121 mmol/L — ABNORMAL LOW (ref 135–145)
Sodium: 123 mmol/L — ABNORMAL LOW (ref 135–145)

## 2018-09-30 LAB — CBC
HCT: 39 % (ref 36.0–46.0)
Hemoglobin: 13 g/dL (ref 12.0–15.0)
MCH: 30.2 pg (ref 26.0–34.0)
MCHC: 33.3 g/dL (ref 30.0–36.0)
MCV: 90.7 fL (ref 80.0–100.0)
Platelets: 320 10*3/uL (ref 150–400)
RBC: 4.3 MIL/uL (ref 3.87–5.11)
RDW: 11.1 % — ABNORMAL LOW (ref 11.5–15.5)
WBC: 3.3 10*3/uL — ABNORMAL LOW (ref 4.0–10.5)
nRBC: 0 % (ref 0.0–0.2)

## 2018-09-30 LAB — ECHOCARDIOGRAM COMPLETE
Height: 65 in
Weight: 2423.3 oz

## 2018-09-30 LAB — MAGNESIUM: Magnesium: 1.6 mg/dL — ABNORMAL LOW (ref 1.7–2.4)

## 2018-09-30 LAB — CORTISOL: Cortisol, Plasma: 19.7 ug/dL

## 2018-09-30 MED ORDER — ALPRAZOLAM 0.25 MG PO TABS
0.2500 mg | ORAL_TABLET | Freq: Two times a day (BID) | ORAL | Status: DC | PRN
  Administered 2018-09-30 (×2): 0.25 mg via ORAL
  Filled 2018-09-30 (×3): qty 1

## 2018-09-30 MED ORDER — ENOXAPARIN SODIUM 40 MG/0.4ML ~~LOC~~ SOLN
40.0000 mg | Freq: Every day | SUBCUTANEOUS | Status: DC
  Administered 2018-09-30 – 2018-10-03 (×4): 40 mg via SUBCUTANEOUS
  Filled 2018-09-30 (×5): qty 0.4

## 2018-09-30 MED ORDER — MAGNESIUM SULFATE 2 GM/50ML IV SOLN
2.0000 g | Freq: Once | INTRAVENOUS | Status: AC
  Administered 2018-09-30: 2 g via INTRAVENOUS
  Filled 2018-09-30: qty 50

## 2018-09-30 NOTE — Progress Notes (Signed)
PROGRESS NOTE  [place] RTM:[id number] DOB: 03/10/1941 DOA: 09/29/2018 PCP: Renaye Rakers, MD   LOS: 1 day   Brief Narrative / Interim history: 78 year old female with history of hypertension, chronic LE edema and interstitial lung disease admitted with hyponatremia to 114 without major symptoms, dysphagia and about 20 pounds weight loss in the months.  Recently started on Celexa.  Has not been taking spironolactone for 2 weeks.  Initially given IV normal saline bolus that made hyponatremia worse.  Neurology consulted.  Normal saline was stopped and started on 3% NaCl with improvement in hyponatremia.  Barium swallow ordered for dysphagia.  Nutrition consulted for weight loss.  Subjective: No major events night of this morning.  No complaints this morning.  Denies headache, vision change, chest pain, dyspnea.  Cough at baseline.  Denies nausea, vomiting or abdominal pain.  Assessment & Plan: Principal Problem:   Hyponatremia Active Problems:   ILD (interstitial lung disease) (HCC)   Essential hypertension   Lower extremity edema   Dysphagia  Hyponatremia: sodium 114 on admission (134 about a month ago).  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  Off Spironolactone for 2 weeks so unlikely cause.  Recently started on Celexa which will contribute.  Morning cortisol and TSH within normal range.  Asymptomatic. -Appreciate nephrology's help (continue 3% NaCl) -Serum sodium every 4 hours -SSRI discontinued. -Hold Aldactone  Dysphagia: Oropharyngeal exam within normal.  Patient was started on ICS and developed thrush.  Treated with Diflucan. -Waiting on barium swallow  Interstitial lung disease: followed by pulmonology.  Has upcoming appointment next week.  Autoimmune work-up significant for speckled high ANA titer which could be seen in MCTD, Sjogren's or PSS.  Recently completed steroid taper.  No respiratory distress. -Outpatient pulmonary  follow-up  Leukopenia: Improved.  Folate and vitamin B12 within normal range.  Essential hypertension: Normotensive -Continue beta-blocker -Hold Aldactone  Lower extremity edema: trace edema on my exam.  History of CHF.  Albumin 3.7. -Follow echocardiogram -Urinalysis -albumin  Hypomagnesemia: -Replenish and recheck  Malnutrition: reports about 20 pounds weight loss Wt Readings from Last 10 Encounters:  09/30/18 68.7 kg  09/18/18 68.3 kg  08/28/18 73 kg  -Consult nutrition  Depression/anxiety: Stable -Hold home Celexa in the setting of hyponatremia.  Scheduled Meds: . feeding supplement  1 Container Oral TID BM  . metoprolol tartrate  25 mg Oral BID  . sodium chloride flush  3 mL Intravenous Q12H   Continuous Infusions: . sodium chloride    . sodium chloride (hypertonic) 38 mL/hr at 09/30/18 1000   PRN Meds:.sodium chloride, acetaminophen **OR** acetaminophen, albuterol, ALPRAZolam, hydrALAZINE, ondansetron **OR** ondansetron (ZOFRAN) IV, sodium chloride flush  DVT prophylaxis: Subcu Lovenox Code Status: Full code Family Communication: Daughter at bedside Disposition Plan: Remains in ICU for close monitoring of hyponatremia  Consultants:   Nephrology  Procedures:   None  Antimicrobials:  None  Objective: Vitals:   09/30/18 1000 09/30/18 1030 09/30/18 1037 09/30/18 1100  BP: (!) 121/44  130/61 (!) 138/50  Pulse: 90 82 81 80  Resp: (!) 34 (!) 33 (!) 37 (!) 28  Temp:      TempSrc:      SpO2: 94% 92% (!) 88% 99%  Weight:      Height:        Intake/Output Summary (Last 24 hours) at 09/30/2018 1126 Last data filed at 09/30/2018 1000 Gross per 24 hour  Intake 1414.64 ml  Output 300 ml  Net 1114.64 ml  Filed Weights   09/29/18 1744 09/30/18 0500  Weight: 67.4 kg 68.7 kg    Examination:  GENERAL: Appears well. No acute distress.  EYES - vision grossly intact. Sclera anicteric.  NOSE- no gross deformity or drainage MOUTH - no oral lesions  noted THROAT- no lesion, erythema or swelling. NECK: No thyromegaly or lymphadenopathy. LUNGS:  No IWOB. Good air movement. CTAB.  HEART:   RRR. Heart sounds normal.  No murmurs ABD: Bowel sounds present. Soft. Non tender.  MSK/EXT: Moves extremities. No obvious deformity.  Trace edema bilaterally SKIN: no apparent skin lesion.  NEURO: Awake, alert and oriented appropriately.  No gross deficit.  PSYCH: Calm. Normal affect.    Data Reviewed: I have independently reviewed following labs and imaging studies  CBC: Recent Labs  Lab 09/29/18 1434 09/30/18 0350  WBC 2.7* 3.3*  NEUTROABS 1.7  --   HGB 13.8 13.0  HCT 40.6 39.0  MCV 90.4 90.7  PLT 331 320   Basic Metabolic Panel: Recent Labs  Lab 09/29/18 1434 09/29/18 1823 09/30/18 0119 09/30/18 0348 09/30/18 0355  NA 114* 111* 114*  --  117*  K 4.7 4.1  --   --  4.1  CL 78* 77*  --   --  84*  CO2 26 26  --   --  26  GLUCOSE 120* 125*  --   --  121*  BUN 6* 5*  --   --  <5*  CREATININE 0.43* 0.46  --   --  0.42*  CALCIUM 8.8* 8.2*  --   --  8.1*  MG  --   --   --  1.6*  --    GFR: Estimated Creatinine Clearance: 55.5 mL/min (A) (by C-G formula based on SCr of 0.42 mg/dL (L)). Liver Function Tests: Recent Labs  Lab 09/29/18 1434  AST 83*  ALT 41  ALKPHOS 73  BILITOT 0.6  PROT 7.6  ALBUMIN 3.7   No results for input(s): LIPASE, AMYLASE in the last 168 hours. No results for input(s): AMMONIA in the last 168 hours. Coagulation Profile: No results for input(s): INR, PROTIME in the last 168 hours. Cardiac Enzymes: No results for input(s): CKTOTAL, CKMB, CKMBINDEX, TROPONINI in the last 168 hours. BNP (last 3 results) No results for input(s): PROBNP in the last 8760 hours. HbA1C: No results for input(s): HGBA1C in the last 72 hours. CBG: No results for input(s): GLUCAP in the last 168 hours. Lipid Profile: No results for input(s): CHOL, HDL, LDLCALC, TRIG, CHOLHDL, LDLDIRECT in the last 72 hours. Thyroid  Function Tests: Recent Labs    09/29/18 1823  TSH 2.154   Anemia Panel: Recent Labs    09/29/18 1823  VITAMINB12 375  FOLATE 18.8   Urine analysis:    Component Value Date/Time   COLORURINE YELLOW 09/29/2018 1434   APPEARANCEUR CLEAR 09/29/2018 1434   LABSPEC 1.017 09/29/2018 1434   PHURINE 7.0 09/29/2018 1434   GLUCOSEU 50 (A) 09/29/2018 1434   HGBUR NEGATIVE 09/29/2018 1434   BILIRUBINUR NEGATIVE 09/29/2018 1434   KETONESUR 20 (A) 09/29/2018 1434   PROTEINUR NEGATIVE 09/29/2018 1434   NITRITE NEGATIVE 09/29/2018 1434   LEUKOCYTESUR TRACE (A) 09/29/2018 1434   Sepsis Labs: Invalid input(s): PROCALCITONIN, LACTICIDVEN  Recent Results (from the past 240 hour(s))  MRSA PCR Screening     Status: None   Collection Time: 09/29/18  5:46 PM  Result Value Ref Range Status   MRSA by PCR NEGATIVE NEGATIVE Final    Comment:  The GeneXpert MRSA Assay (FDA approved for NASAL specimens only), is one component of a comprehensive MRSA colonization surveillance program. It is not intended to diagnose MRSA infection nor to guide or monitor treatment for MRSA infections. Performed at [facility], [address] [place], [place] [address]       Radiology Studies: Dg Chest 2 View  Result Date: 09/29/2018 CLINICAL DATA:  Shortness of breath. EXAM: CHEST - 2 VIEW COMPARISON:  Chest x-ray dated September 04, 2018. FINDINGS: The heart size and mediastinal contours are within normal limits. Normal pulmonary vascularity. Unchanged interstitial opacities at the left greater than right lung bases. No focal consolidation, pleural effusion, or pneumothorax. No acute osseous abnormality. Distended stomach. IMPRESSION: 1. Unchanged bibasilar interstitial lung disease.  No acute finding. Electronically Signed   By: Obie Dredge M.D.   On: 09/29/2018 14:46    [facility] Pager [phone]  If 7PM-7AM, please contact  night-coverage [email or web address] Password [facility] 09/30/2018, 11:26 AM

## 2018-09-30 NOTE — Progress Notes (Signed)
  Echocardiogram 2D Echocardiogram has been performed.  Leta Jungling M 09/30/2018, 9:31 AM

## 2018-09-30 NOTE — Progress Notes (Signed)
CRITICAL VALUE ALERT  Critical Value: sodium 117   Date & Time Notied:  09/30/2018, 1255  Provider Notified: Alanda Slim MD  Orders Received/Actions taken: awaiting further orders, pt. On hypertonic saline gtt

## 2018-09-30 NOTE — Evaluation (Addendum)
Clinical/Bedside Swallow Evaluation Patient Details  Name: Sabrina Douglas MRN: [id number] Date of Birth: 09-07-1940  Today's Date: 09/30/2018 Time: SLP Start Time (ACUTE ONLY): 1452 SLP Stop Time (ACUTE ONLY): 1512 SLP Time Calculation (min) (ACUTE ONLY): 20 min  Past Medical History:  Past Medical History:  Diagnosis Date  . Anxiety   . Depression   . Hyperlipidemia    Past Surgical History: History reviewed. No pertinent surgical history. HPI:  78 y.o. female with a past medical history of essential hypertension, chronic lower extremity edema who is under evaluation by pulmonology for interstitial lung disease diagnosed recently.  Patient also mentioned that she had was given a course of steroids 3 weeks ago for respiratory symptoms and as a result of which she developed oral thrush.  She was given a course of Diflucan.  The thrush improved but the patient continued to have difficulty swallowing and is c/o globus sensation with primarily solids; barium swallow pending 10/01/18; BSE initiated; CXR on 09/29/18 indicated Unchanged bibasilar interstitial lung disease.  No acute finding  Assessment / Plan / Recommendation Clinical Impression   Pt demonstrated oropharyngeal dysphagia characterized by oral holding and prolonged oral preparation/propulsion with puree/thin liquids; no overt s/s of aspiration noted with thin and/or puree consistencies, although pt does exhibit a slightly hoarse vocal quality; pharyngoesophageal symptoms included globus sensation which required multiple swallows and small bites/sips to transfer into the pharynx posteriorly; education provided to family/pt re: esophageal precautions during PO intake; recommend Full liquids until barium swallow completed to fully assess esophageal function per pt request and to promote pt safety during PO consumption; full liquids will also allow pt to increase caloric intake vs. thin liquids; ST will f/u for diet tolerance and potential  advancement pending barium swallow as well as educate pt/family re: esophageal precautions further prior to D/C; thank you for this consult. SLP Visit Diagnosis: Dysphagia, oropharyngeal phase (R13.12);Dysphagia, pharyngoesophageal phase (R13.14)    Aspiration Risk  Mild aspiration risk;Moderate aspiration risk    Diet Recommendation   Full liquids  Medication Administration: Crushed with puree    Other  Recommendations Oral Care Recommendations: Oral care BID   Follow up Recommendations Other (comment)(TBD)      Frequency and Duration min 2x/week  1 week       Prognosis Prognosis for Safe Diet Advancement: Good      Swallow Study   General Date of Onset: 09/29/18 HPI: 78 y.o. female with a past medical history of essential hypertension, chronic lower extremity edema who is under evaluation by pulmonology for interstitial lung disease diagnosed recently.  She is supposed to have a follow-up appointment with her pulmonologist next week to go over the results of the studies ordered at the last visit.  Patient also mentions that she had was given a course of steroids 3 weeks ago for respiratory symptoms and as a result of which she developed oral thrush.  She was given a course of Diflucan.  The thrush improved but the patient continued to have difficulty swallowing Type of Study: Bedside Swallow Evaluation Previous Swallow Assessment: (n/a) Diet Prior to this Study: Thin liquids Temperature Spikes Noted: No Respiratory Status: Room air History of Recent Intubation: No Behavior/Cognition: Cooperative;Lethargic/Drowsy;Requires cueing Oral Cavity Assessment: Within Functional Limits Oral Care Completed by SLP: Recent completion by staff Self-Feeding Abilities: Needs assist Patient Positioning: Upright in bed Baseline Vocal Quality: Low vocal intensity;Hoarse;Other (comment)(minimal hoarseness) Volitional Cough: Weak Volitional Swallow: Able to elicit    Oral/Motor/Sensory  Function Overall Oral Motor/Sensory  Function: Generalized oral weakness Facial Symmetry: Within Functional Limits Lingual ROM: Within Functional Limits Lingual Symmetry: Within Functional Limits   Ice Chips Ice chips: Not tested   Thin Liquid Thin Liquid: Within functional limits Presentation: Straw    Nectar Thick Nectar Thick Liquid: Not tested   Honey Thick Honey Thick Liquid: Not tested   Puree Puree: Impaired Presentation: Spoon Oral Phase Functional Implications: Prolonged oral transit;Oral holding Pharyngeal Phase Impairments: Multiple swallows   Solid     Solid: Not tested(per pt request) Presentation: Spoon      Tressie Stalker, M.S., CCC-SLP 09/30/2018,3:59 PM

## 2018-09-30 NOTE — Progress Notes (Addendum)
Per radiology, barium swallow to be done tomorrow morning. Patient needs to be NPO for 3 hours prior to the study. Patient and Daughter made aware.

## 2018-09-30 NOTE — Progress Notes (Signed)
CRITICAL VALUE ALERT  Critical Value:  Na+ 114  Date & Time Notied:  09/30/18 @ 0145am  Provider Notified: On call [facility]  Orders Received/Actions taken: No new orders yet

## 2018-09-30 NOTE — Progress Notes (Signed)
CRITICAL VALUE ALERT  Critical Value:  Na+ 117  Date & Time Notied:  09/30/18 @0444am   Provider Notified: On-call [facility]  Orders Received/Actions taken: No new orders yet

## 2018-09-30 NOTE — Progress Notes (Signed)
[facility] Progress Note  Subjective: Na+ up 117 this am.  Not feeling any better. Uosm was high at 600  Vitals:   09/30/18 0600 09/30/18 0609 09/30/18 0700 09/30/18 0800  BP: 131/75  (!) 151/68 (!) 107/40  Pulse: 99  93 90  Resp: (!) 25  (!) 29 15  Temp:    98.1 F (36.7 C)  TempSrc:    Oral  SpO2: 98% 94% 92% 93%  Weight:      Height:        Inpatient medications: . feeding supplement  1 Container Oral TID BM  . metoprolol tartrate  25 mg Oral BID  . sodium chloride flush  3 mL Intravenous Q12H   . sodium chloride    . sodium chloride (hypertonic) 38 mL/hr at 09/30/18 0850   sodium chloride, acetaminophen **OR** acetaminophen, albuterol, ALPRAZolam, hydrALAZINE, ondansetron **OR** ondansetron (ZOFRAN) IV, sodium chloride flush  Iron/TIBC/Ferritin/ %Sat No results found for: IRON, TIBC, FERRITIN, IRONPCTSAT  Exam: Gen pt is a little more animated this morning No jvd Chest clear bilat to bases  RRR no MRG Abd soft ntnd no mass or ascites +bs Ext 1+ ankle/ pedal edema Neuro is alert, nonfocal , Ox 3    Home meds:  - metoprolol 25 mg bid/ spironolactone 25 qd  - simvastatin 20 qd  - alprazolam prn bid/ citalopram 10 qd  - prednisone taper 40mg  > 10mg  over 8 days  - nystatin swish /swallow  - vitamins/ prns    Na+ 114 > 111 after NS bolus   K 4.1  CO2 26  BUN 5  Cr 0.46  Ca 8.2   WBC 2K Hb 13 ptl wnl   UNa 160 UOsm pending   Assessment: 1. Hyponatremia - euvolemic to slightly vol expanded. Got worse w/ NS bolus.  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 Symptomatic. SSRI dc'd. Getting 3%, will continue for now, f/u labs q 4hrs. 2. Interstitial lung disease - recent dx, fibrotic lung changes, cough.  3. LE edema - not severe.  Echo ordered.  4. HTN taking BB and aldactone. 5. Depression / anxiety  P: 1. As above      [facility] 09/30/2018, 9:17  AM  Recent Labs  Lab 09/29/18 1434 09/29/18 1823 09/30/18 0119 09/30/18 0355  NA 114* 111* 114* 117*  K 4.7 4.1  --  4.1  CL 78* 77*  --  84*  CO2 26 26  --  26  GLUCOSE 120* 125*  --  121*  BUN 6* 5*  --  <5*  CREATININE 0.43* 0.46  --  0.42*  CALCIUM 8.8* 8.2*  --  8.1*  ALBUMIN 3.7  --   --   --    Recent Labs  Lab 09/29/18 1434  AST 83*  ALT 41  ALKPHOS 73  BILITOT 0.6  PROT 7.6   Recent Labs  Lab 09/29/18 1434 09/30/18 0350  WBC 2.7* 3.3*  NEUTROABS 1.7  --   HGB 13.8 13.0  HCT 40.6 39.0  MCV 90.4 90.7  PLT 331 320

## 2018-10-01 ENCOUNTER — Telehealth: Payer: Self-pay | Admitting: Pulmonary Disease

## 2018-10-01 ENCOUNTER — Inpatient Hospital Stay (HOSPITAL_COMMUNITY): Payer: Medicare Other

## 2018-10-01 DIAGNOSIS — K222 Esophageal obstruction: Secondary | ICD-10-CM

## 2018-10-01 DIAGNOSIS — E440 Moderate protein-calorie malnutrition: Secondary | ICD-10-CM

## 2018-10-01 LAB — COMPREHENSIVE METABOLIC PANEL
ALT: 31 U/L (ref 0–44)
AST: 57 U/L — ABNORMAL HIGH (ref 15–41)
Albumin: 3.1 g/dL — ABNORMAL LOW (ref 3.5–5.0)
Alkaline Phosphatase: 62 U/L (ref 38–126)
Anion gap: 8 (ref 5–15)
BUN: 5 mg/dL — ABNORMAL LOW (ref 8–23)
CO2: 27 mmol/L (ref 22–32)
Calcium: 8.4 mg/dL — ABNORMAL LOW (ref 8.9–10.3)
Chloride: 88 mmol/L — ABNORMAL LOW (ref 98–111)
Creatinine, Ser: 0.45 mg/dL (ref 0.44–1.00)
GFR calc Af Amer: >60 mL/min (ref >60)
GFR calc non Af Amer: >60 mL/min (ref >60)
Glucose, Bld: 101 mg/dL — ABNORMAL HIGH (ref 70–99)
Potassium: 4.2 mmol/L (ref 3.5–5.1)
Sodium: 123 mmol/L — ABNORMAL LOW (ref 135–145)
Total Bilirubin: 0.4 mg/dL (ref 0.3–1.2)
Total Protein: 6.6 g/dL (ref 6.5–8.1)

## 2018-10-01 LAB — SODIUM
Sodium: 121 mmol/L — ABNORMAL LOW (ref 135–145)
Sodium: 122 mmol/L — ABNORMAL LOW (ref 135–145)
Sodium: 123 mmol/L — ABNORMAL LOW (ref 135–145)
Sodium: 125 mmol/L — ABNORMAL LOW (ref 135–145)
Sodium: 125 mmol/L — ABNORMAL LOW (ref 135–145)

## 2018-10-01 LAB — CK TOTAL AND CKMB (NOT AT ARMC)
CK, MB: 133 ng/mL — ABNORMAL HIGH (ref 0.5–5.0)
Relative Index: 34.9 — ABNORMAL HIGH (ref 0.0–2.5)
Total CK: 381 U/L — ABNORMAL HIGH (ref 38–234)

## 2018-10-01 MED ORDER — FUROSEMIDE 40 MG PO TABS
80.0000 mg | ORAL_TABLET | Freq: Two times a day (BID) | ORAL | Status: AC
  Administered 2018-10-01 – 2018-10-03 (×4): 80 mg via ORAL
  Filled 2018-10-01 (×4): qty 2

## 2018-10-01 MED ORDER — SODIUM CHLORIDE 3 % IV SOLN
INTRAVENOUS | Status: DC
  Administered 2018-10-01 (×2): 38 mL/h via INTRAVENOUS
  Filled 2018-10-01 (×3): qty 500

## 2018-10-01 MED ORDER — ADULT MULTIVITAMIN W/MINERALS CH
1.0000 | ORAL_TABLET | Freq: Every day | ORAL | Status: DC
  Administered 2018-10-02 – 2018-10-03 (×2): 1 via ORAL
  Filled 2018-10-01 (×3): qty 1

## 2018-10-01 MED ORDER — ALPRAZOLAM 0.25 MG PO TABS
0.1250 mg | ORAL_TABLET | Freq: Two times a day (BID) | ORAL | Status: DC | PRN
  Administered 2018-10-01 – 2018-10-04 (×3): 0.125 mg via ORAL
  Filled 2018-10-01 (×2): qty 1

## 2018-10-01 NOTE — Progress Notes (Signed)
SLP Cancellation Note  Patient Details Name: Sabrina Douglas MRN: [id number] DOB: 12/30/1940   Cancelled treatment:       Reason Eval/Treat Not Completed: Other (comment)(pt remains npo for esophagram)   Chales Abrahams 10/01/2018, 1:55 PM   Donavan Burnet, MS [facility] SLP Acute Rehab Services Pager [phone] Office [phone]

## 2018-10-01 NOTE — Evaluation (Signed)
Occupational Therapy Evaluation Patient Details Name: Sabrina Douglas MRN: [id number] DOB: [birth year] Today's Date: 10/01/2018    History of Present Illness  SHANEQUA LACKMAN is a 78 y.o. female with a past medical history of essential hypertension, chronic lower extremity edema who is under evaluation by pulmonology for interstitial lung disease diagnosed recently. Admitted with weakness, difficulty swallowing, 20# weight loss and severe hyponatremia with a sodium level of 114.    Clinical Impression   This 78 y/o female presents with the above. At baseline pt is independent with ADL and functional mobility. Pt presenting with decreased bil UE AROM, generalized weakness, decreased activity tolerance. She currently requires minA for UB ADL, modA for LB ADL and standing/marching in place during session today using RW with minA. Pt will benefit from continued acute OT services and recommend follow up HHOT therapy services after discharge to maximize her safety and independence with ADL and mobility. Will follow.     Follow Up Recommendations  Home health OT;Supervision/Assistance - 24 hour(HH aide)    Equipment Recommendations  None recommended by OT           Precautions / Restrictions Precautions Precautions: Fall Restrictions Weight Bearing Restrictions: No      Mobility Bed Mobility Overal bed mobility: Needs Assistance Bed Mobility: Supine to Sit     Supine to sit: HOB elevated;Min guard     General bed mobility comments: OOB in recliner  Transfers Overall transfer level: Needs assistance Equipment used: Rolling walker (2 wheeled) Transfers: Sit to/from Stand Sit to Stand: Min assist Stand pivot transfers: Supervision       General transfer comment: light minA to rise and steady at RW, pt demonstrates safe hand placement    Balance Overall balance assessment: Needs assistance   Sitting balance-Leahy Scale: Good     Standing balance support: During functional  activity Standing balance-Leahy Scale: Fair Standing balance comment: stood partially to perform hygiene after toileting                           ADL either performed or assessed with clinical judgement   ADL Overall ADL's : Needs assistance/impaired Eating/Feeding: NPO   Grooming: Set up;Sitting   Upper Body Bathing: Minimal assistance;Sitting   Lower Body Bathing: Moderate assistance;Sit to/from stand   Upper Body Dressing : Minimal assistance;Sitting   Lower Body Dressing: Moderate assistance;Sit to/from stand Lower Body Dressing Details (indicate cue type and reason): minA standing balance Toilet Transfer: Minimal assistance;Ambulation;RW Toilet Transfer Details (indicate cue type and reason): simulated with mobility marching in place at chair  Toileting- Clothing Manipulation and Hygiene: Moderate assistance;Sit to/from stand       Functional mobility during ADLs: Minimal assistance;Rolling walker       Vision         Perception     Praxis      Pertinent Vitals/Pain Pain Assessment: Faces Faces Pain Scale: Hurts a little bit Pain Location: bil shoulders (RUE>LUE) with P/AAROM Pain Descriptors / Indicators: Discomfort Pain Intervention(s): Monitored during session;Repositioned;Limited activity within patient's tolerance     Hand Dominance     Extremity/Trunk Assessment Upper Extremity Assessment Upper Extremity Assessment: RUE deficits/detail;LUE deficits/detail;Generalized weakness RUE Deficits / Details: limited AROM (grossly 0-45*), with PROM limited to grossly 90* due to shoulder pain, pt reports shoulder "stiffness" only began a few weeks ago due to decreased use of UEs LUE Deficits / Details: limited AROM (grossly 0-75*), with PROM close to [facility], pt  reports shoulder "stiffness" only began a few weeks ago due to decreased use of UEs   Lower Extremity Assessment Lower Extremity Assessment: Generalized weakness       Communication  Communication Communication: No difficulties   Cognition Arousal/Alertness: Awake/alert Behavior During Therapy: WFL for tasks assessed/performed Overall Cognitive Status: Within Functional Limits for tasks assessed                                     General Comments  two daughters in room during session    Exercises     Shoulder Instructions      Home Living Family/patient expects to be discharged to:: Private residence Living Arrangements: Alone Available Help at Discharge: Family;Available PRN/intermittently Type of Home: House Home Access: Stairs to enter Entrance Stairs-Number of Steps: 1   Home Layout: One level         Bathroom Toilet: Handicapped height     Home Equipment: Grab bars - tub/shower;Shower seat - built in          Prior Functioning/Environment Level of Independence: Independent        Comments: has been recently weaker, furniture walking and spending a lot of time sitting with some redness on her bottom        OT Problem List: Decreased strength;Decreased range of motion;Decreased activity tolerance;Impaired balance (sitting and/or standing);Impaired UE functional use      OT Treatment/Interventions: Self-care/ADL training;Therapeutic exercise;Energy conservation;DME and/or AE instruction;Therapeutic activities;Patient/family education;Balance training    OT Goals(Current goals can be found in the care plan section) Acute Rehab OT Goals Patient Stated Goal: to return to independent OT Goal Formulation: With patient Time For Goal Achievement: 10/15/18 Potential to Achieve Goals: Good  OT Frequency: Min 2X/week   Barriers to D/C:            Co-evaluation              AM-PAC OT "6 Clicks" Daily Activity     Outcome Measure Help from another person eating meals?: None Help from another person taking care of personal grooming?: A Little Help from another person toileting, which includes using toliet, bedpan, or  urinal?: A Lot Help from another person bathing (including washing, rinsing, drying)?: A Lot Help from another person to put on and taking off regular upper body clothing?: A Little Help from another person to put on and taking off regular lower body clothing?: A Lot 6 Click Score: 16   End of Session Equipment Utilized During Treatment: Rolling walker Nurse Communication: Mobility status  Activity Tolerance: Patient tolerated treatment well Patient left: in chair;with call bell/phone within reach;with family/visitor present;with chair alarm set  OT Visit Diagnosis: Muscle weakness (generalized) (M62.81)                Time: 1761-6073 OT Time Calculation (min): 17 min Charges:  OT General Charges $OT Visit: 1 Visit OT Evaluation $OT Eval Moderate Complexity: 1 Mod  Marcy Siren, OT [employer] Pager [phone] Office [phone]   Orlando Penner 10/01/2018, 2:14 PM

## 2018-10-01 NOTE — Progress Notes (Signed)
Subjective: Interval History: has complaints tired and stress.  Objective: Vital signs in last 24 hours: Temp:  [97.2 F (36.2 C)-98.4 F (36.9 C)] 97.2 F (36.2 C) (02/24 0800) Pulse Rate:  [68-97] 68 (02/24 1100) Resp:  [14-32] 26 (02/24 1100) BP: (94-152)/(14-71) 124/45 (02/24 1100) SpO2:  [88 %-100 %] 98 % (02/24 1100) Weight change:   Intake/Output from previous day: 02/23 0701 - 02/24 0700 In: 1788.3 [P.O.:1260; I.V.:478.3; IV Piggyback:50] Out: 75 [Urine:75] Intake/Output this shift: Total I/O In: -  Out: 650 [Urine:650]  General appearance: alert, cooperative and no distress Resp: diminished breath sounds bibasilar Cardio: S1, S2 normal and systolic murmur: systolic ejection 2/6, crescendo and decrescendo at 2nd left intercostal space GI: soft, non-tender; bowel sounds normal; no masses,  no organomegaly Extremities: edema 1-2+  Lab Results: Recent Labs    09/29/18 1434 09/30/18 0350  WBC 2.7* 3.3*  HGB 13.8 13.0  HCT 40.6 39.0  PLT 331 320   BMET:  Recent Labs    09/30/18 0355  10/01/18 0404 10/01/18 0743 10/01/18 1148  NA 117*   < > 123* 122* 123*  K 4.1  --  4.2  --   --   CL 84*  --  88*  --   --   CO2 26  --  27  --   --   GLUCOSE 121*  --  101*  --   --   BUN <5*  --  5*  --   --   CREATININE 0.42*  --  0.45  --   --   CALCIUM 8.1*  --  8.4*  --   --    < > = values in this interval not displayed.   No results for input(s): PTH in the last 72 hours. Iron Studies: No results for input(s): IRON, TIBC, TRANSFERRIN, FERRITIN in the last 72 hours.  Studies/Results: Dg Chest 2 View  Result Date: 09/29/2018 CLINICAL DATA:  Shortness of breath. EXAM: CHEST - 2 VIEW COMPARISON:  Chest x-ray dated September 04, 2018. FINDINGS: The heart size and mediastinal contours are within normal limits. Normal pulmonary vascularity. Unchanged interstitial opacities at the left greater than right lung bases. No focal consolidation, pleural effusion, or  pneumothorax. No acute osseous abnormality. Distended stomach. IMPRESSION: 1. Unchanged bibasilar interstitial lung disease.  No acute finding. Electronically Signed   By: Obie Dredge M.D.   On: 09/29/2018 14:46    I have reviewed the patient's current medications.  Assessment/Plan: 1 Hyponatremia  Improved, for some reason 3% stopped. Appropriate rate of correction.  Some vol xs. This is most c/w SIADH. Will add Furosemide,cont 3%, another 24h.  Will probably need tolvaptan. 2 Interstitial lung disease 3 Debillitation.  4 Depression meds stopped P 3%, lasix, follow SNa.    LOS: 2 days   Sabrina Douglas 10/01/2018,12:41 PM

## 2018-10-01 NOTE — Progress Notes (Signed)
PROGRESS NOTE  Sabrina Douglas XNT:[id number] DOB: September 24, 1940 DOA: 09/29/2018 PCP: Renaye Rakers, MD   LOS: 2 days   Brief Narrative / Interim history: 78 year old female with history of hypertension, chronic LE edema and interstitial lung disease admitted with hyponatremia to 114 without major symptoms, dysphagia and about 20 pounds weight loss in the months.  Recently started on Celexa.  Has not been taking spironolactone for 2 weeks.  Initially given IV normal saline bolus that made hyponatremia worse.  Neurology consulted.  Normal saline was stopped and started on 3% NaCl with improvement in hyponatremia. Nutrition consulted recommended multivitamins and supplements.    In regards to dysphagia, SLP consulted and barium swallow ordered and showed possible esophageal stricture   Echocardiogram without significant structural or functional abnormality except for more bubble severe pulmonary hypertension.  Cardiology consulted for this.  About interstitial lung disease and possible autoimmune process, no intervention inpatient per pulmonology, Dr. Marchelle Gearing but she could be a candidate for [facility] outpatient.   Subjective: No major events overnight of this morning.  Reports feeling exhausted and tired.  No other complaints.  Denies chest pain, dyspnea, nausea, vomiting or abdominal pain.  Denies headache, vision change or focal neuro deficit. Daughter at bedside.  Assessment & Plan: Principal Problem:   Hyponatremia Active Problems:   ILD (interstitial lung disease) (HCC)   Essential hypertension   Lower extremity edema   Dysphagia   Malnutrition of moderate degree  Hyponatremia: sodium 114 on admission (134 about a month ago).  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  Off Spironolactone for 2 weeks so unlikely cause.  Recently started on Celexa which will contribute.  Morning cortisol and TSH within normal range.  Asymptomatic except for fatigue which  could be due to ILD -Appreciate nephrology's help (resume and continue 3% NaCl for 24 hours, Lasix) -Serum sodium every 4 hours -SSRI discontinued. -Hold Aldactone  Dysphagia: Oropharyngeal exam within normal.  Patient was started on ICS and developed thrush.  Treated with Diflucan.  Barium swallow concerning for esophageal stricture -Appreciate SLP input, mild to moderate aspiration risk. -Full liquid diet, medication crushed with pure -We will consult GI in the morning for possible esophageal stricture.   Interstitial lung disease: followed by pulmonology.  Has upcoming appointment next week.  Autoimmune work-up significant for speckled high ANA titer, elevated RNP CK and CK-MB which could be seen in MCTD.  Currently without significant respiratory symptoms.  Recently completed steroid taper.  -Outpatient pulmonary follow-up per pulmonology, Dr. Marchelle Gearing  Pulmonary hypertension/lower extremity edema: Echo this admission with EF greater than 65% and no significant structural functional abnormality other than concern for severe pulmonary hypertension. -Cardiology consulted  Essential hypertension: Normotensive -Continue beta-blocker -Hold Aldactone  Leukopenia: Improved.  Folate and vitamin B12 within normal range.  Hypomagnesemia: -Replenish and recheck  Debilitation:  -PT/OT eval-Home health PT, home health aide and rolling walker with 5 inch wheels  Moderate malnutrition: reports about 20 pounds weight loss Wt Readings from Last 10 Encounters:  09/30/18 68.7 kg  09/18/18 68.3 kg  08/28/18 73 kg  -Consult nutrition  Depression/anxiety: Stable -Hold home Celexa in the setting of hyponatremia.  Scheduled Meds: . enoxaparin (LOVENOX) injection  40 mg Subcutaneous Daily  . feeding supplement  1 Container Oral TID BM  . furosemide  80 mg Oral BID  . metoprolol tartrate  25 mg Oral BID  . multivitamin with minerals  1 tablet Oral Daily  . sodium chloride flush  3 mL  Intravenous Q12H   Continuous Infusions: . sodium chloride    . sodium chloride (hypertonic) 38 mL/hr (10/01/18 1307)   PRN Meds:.sodium chloride, acetaminophen **OR** acetaminophen, albuterol, ALPRAZolam, hydrALAZINE, ondansetron **OR** ondansetron (ZOFRAN) IV, sodium chloride flush  DVT prophylaxis: Subcu Lovenox Code Status: Full code Family Communication: Daughter at bedside.  Discussed the above findings and plans Disposition Plan: Remains in ICU for close monitoring of hyponatremia on hypertonic saline.  Consultants:   Nephrology  Cardiology  Procedures:   None  Antimicrobials:  None  Objective: Vitals:   10/01/18 0800 10/01/18 0922 10/01/18 1000 10/01/18 1100  BP: (!) 152/60 (!) 132/55 (!) 112/48 (!) 124/45  Pulse: 82 78 80 68  Resp: (!) 23  (!) 21 (!) 26  Temp: (!) 97.2 F (36.2 C)     TempSrc: Oral     SpO2: 97%  98% 98%  Weight:      Height:        Intake/Output Summary (Last 24 hours) at 10/01/2018 1323 Last data filed at 10/01/2018 1000 Gross per 24 hour  Intake 904.82 ml  Output 725 ml  Net 179.82 ml   Filed Weights   09/29/18 1744 09/30/18 0500  Weight: 67.4 kg 68.7 kg    Examination:  GENERAL: Appears well. No acute distress.  EYES - vision grossly intact. Sclera anicteric.  NOSE- no gross deformity or drainage MOUTH - no oral lesions noted THROAT- no lesion, erythema or swelling. NECK: No thyromegaly or lymphadenopathy. LUNGS:  No IWOB. Good air movement. CTAB.  HEART:   RRR. Heart sounds normal.  No murmurs ABD: Bowel sounds present. Soft. Non tender.  MSK/EXT: Moves extremities. No obvious deformity.  Trace edema bilaterally SKIN: no apparent skin lesion.  NEURO: Awake, alert and oriented appropriately.  No gross deficit.  PSYCH: Calm. Normal affect.    Data Reviewed: I have independently reviewed following labs and imaging studies  CBC: Recent Labs  Lab 09/29/18 1434 09/30/18 0350  WBC 2.7* 3.3*  NEUTROABS 1.7  --   HGB  13.8 13.0  HCT 40.6 39.0  MCV 90.4 90.7  PLT 331 320   Basic Metabolic Panel: Recent Labs  Lab 09/29/18 1434 09/29/18 1823  09/30/18 0348 09/30/18 0355  09/30/18 2030 09/30/18 2351 10/01/18 0404 10/01/18 0743 10/01/18 1148  NA 114* 111*   < >  --  117*   < > 123* 121* 123* 122* 123*  K 4.7 4.1  --   --  4.1  --   --   --  4.2  --   --   CL 78* 77*  --   --  84*  --   --   --  88*  --   --   CO2 26 26  --   --  26  --   --   --  27  --   --   GLUCOSE 120* 125*  --   --  121*  --   --   --  101*  --   --   BUN 6* 5*  --   --  <5*  --   --   --  5*  --   --   CREATININE 0.43* 0.46  --   --  0.42*  --   --   --  0.45  --   --   CALCIUM 8.8* 8.2*  --   --  8.1*  --   --   --  8.4*  --   --  MG  --   --   --  1.6*  --   --   --   --   --   --   --    < > = values in this interval not displayed.   GFR: Estimated Creatinine Clearance: 55.5 mL/min (by C-G formula based on SCr of 0.45 mg/dL). Liver Function Tests: Recent Labs  Lab 09/29/18 1434 10/01/18 0404  AST 83* 57*  ALT 41 31  ALKPHOS 73 62  BILITOT 0.6 0.4  PROT 7.6 6.6  ALBUMIN 3.7 3.1*   No results for input(s): LIPASE, AMYLASE in the last 168 hours. No results for input(s): AMMONIA in the last 168 hours. Coagulation Profile: No results for input(s): INR, PROTIME in the last 168 hours. Cardiac Enzymes: Recent Labs  Lab 10/01/18 0404  CKTOTAL 381*  CKMB 133.0*   BNP (last 3 results) No results for input(s): PROBNP in the last 8760 hours. HbA1C: No results for input(s): HGBA1C in the last 72 hours. CBG: No results for input(s): GLUCAP in the last 168 hours. Lipid Profile: No results for input(s): CHOL, HDL, LDLCALC, TRIG, CHOLHDL, LDLDIRECT in the last 72 hours. Thyroid Function Tests: Recent Labs    09/29/18 1823  TSH 2.154   Anemia Panel: Recent Labs    09/29/18 1823  VITAMINB12 375  FOLATE 18.8   Urine analysis:    Component Value Date/Time   COLORURINE YELLOW 09/29/2018 1434    APPEARANCEUR CLEAR 09/29/2018 1434   LABSPEC 1.017 09/29/2018 1434   PHURINE 7.0 09/29/2018 1434   GLUCOSEU 50 (A) 09/29/2018 1434   HGBUR NEGATIVE 09/29/2018 1434   BILIRUBINUR NEGATIVE 09/29/2018 1434   KETONESUR 20 (A) 09/29/2018 1434   PROTEINUR NEGATIVE 09/29/2018 1434   NITRITE NEGATIVE 09/29/2018 1434   LEUKOCYTESUR TRACE (A) 09/29/2018 1434   Sepsis Labs: Invalid input(s): PROCALCITONIN, LACTICIDVEN  Recent Results (from the past 240 hour(s))  MRSA PCR Screening     Status: None   Collection Time: 09/29/18  5:46 PM  Result Value Ref Range Status   MRSA by PCR NEGATIVE NEGATIVE Final    Comment:        The GeneXpert MRSA Assay (FDA approved for NASAL specimens only), is one component of a comprehensive MRSA colonization surveillance program. It is not intended to diagnose MRSA infection nor to guide or monitor treatment for MRSA infections. Performed at [facility], [address] [place], [place] [address]       Radiology Studies: No results found.   [facility] Pager [phone]  If 7PM-7AM, please contact night-coverage [email or web address] Password [email or web address] 10/01/2018, 1:23 PM

## 2018-10-01 NOTE — Evaluation (Signed)
Physical Therapy Evaluation Patient Details Name: Sabrina Douglas MRN: [id number] DOB: 1940-11-10 Today's Date: 10/01/2018   History of Present Illness   Sabrina Douglas is a 78 y.o. female with a past medical history of essential hypertension, chronic lower extremity edema who is under evaluation by pulmonology for interstitial lung disease diagnosed recently. Admitted with weakness, difficulty swallowing, 20# weight loss and severe hyponatremia with a sodium level of 114.   Clinical Impression  Patient presents with decreased mobility due to generalized weakness, decreased balance and now fall risk.  She will benefit from skilled PT in the acute setting to allow return home with family support and follow up HHPT/aide and RW for home.     Follow Up Recommendations Home health PT;Supervision - Intermittent;Other (comment)(HH aide )    Equipment Recommendations  Rolling walker with 5" wheels    Recommendations for Other Services       Precautions / Restrictions Precautions Precautions: Fall Restrictions Weight Bearing Restrictions: No      Mobility  Bed Mobility Overal bed mobility: Needs Assistance Bed Mobility: Supine to Sit     Supine to sit: HOB elevated;Min guard     General bed mobility comments: for lines  Transfers Overall transfer level: Needs assistance Equipment used: Rolling walker (2 wheeled) Transfers: Sit to/from [employer] Sit to Stand: Min guard Stand pivot transfers: Supervision       General transfer comment: used RW to pivot to [facility]  Ambulation/Gait Ambulation/Gait assistance: Min Emergency planning/management officer (Feet): 90 Feet Assistive device: Rolling walker (2 wheeled) Gait Pattern/deviations: Step-to pattern;Step-through pattern;Decreased stride length     General Gait Details: slow pace, fatigues quickly, VSS  Stairs            Wheelchair Mobility    Modified Rankin (Stroke Patients Only)       Balance  Overall balance assessment: Needs assistance   Sitting balance-Leahy Scale: Good     Standing balance support: During functional activity Standing balance-Leahy Scale: Fair Standing balance comment: stood partially to perform hygiene after toileting                             Pertinent Vitals/Pain Pain Assessment: No/denies pain    Home Living Family/patient expects to be discharged to:: Private residence Living Arrangements: Alone Available Help at Discharge: Family;Available PRN/intermittently Type of Home: House Home Access: Stairs to enter   [employer] of Steps: 1 Home Layout: One level Home Equipment: None      Prior Function Level of Independence: Independent         Comments: has been recently weaker, furniture walking and spending a lot of time sitting with some redness on her bottom     Hand Dominance        Extremity/Trunk Assessment   Upper Extremity Assessment Upper Extremity Assessment: Defer to OT evaluation    Lower Extremity Assessment Lower Extremity Assessment: Generalized weakness       Communication   Communication: No difficulties  Cognition Arousal/Alertness: Awake/alert Behavior During Therapy: WFL for tasks assessed/performed Overall Cognitive Status: Within Functional Limits for tasks assessed                                        General Comments General comments (skin integrity, edema, etc.): daughter in room and supportive, reports she has 4 girls and 8  grandchildren and can have assist at home    Exercises     Assessment/Plan    PT Assessment Patient needs continued PT services  PT Problem List Decreased strength;Decreased mobility;Decreased activity tolerance;Decreased balance;Decreased knowledge of use of DME       PT Treatment Interventions DME instruction;Functional mobility training;Balance training;Patient/family education;Gait training;Therapeutic exercise;Therapeutic  activities    PT Goals (Current goals can be found in the Care Plan section)  Acute Rehab PT Goals Patient Stated Goal: to return to independent PT Goal Formulation: With patient/family Time For Goal Achievement: 10/15/18 Potential to Achieve Goals: Good    Frequency Min 3X/week   Barriers to discharge        Co-evaluation               AM-PAC PT "6 Clicks" Mobility  Outcome Measure Help needed turning from your back to your side while in a flat bed without using bedrails?: None Help needed moving from lying on your back to sitting on the side of a flat bed without using bedrails?: A Little Help needed moving to and from a bed to a chair (including a wheelchair)?: A Little Help needed standing up from a chair using your arms (e.g., wheelchair or bedside chair)?: A Little Help needed to walk in hospital room?: A Little Help needed climbing 3-5 steps with a railing? : A Little 6 Click Score: 19    End of Session Equipment Utilized During Treatment: Gait belt Activity Tolerance: Patient limited by fatigue Patient left: with call bell/phone within reach;in chair;with chair alarm set;with family/visitor present   PT Visit Diagnosis: Other abnormalities of gait and mobility (R26.89);Muscle weakness (generalized) (M62.81)    Time: 8811-0315 PT Time Calculation (min) (ACUTE ONLY): 24 min   Charges:   PT Evaluation $PT Eval Moderate Complexity: 1 Mod PT Treatments $Gait Training: 8-22 mins       Sheran Lawless, [place] [facility] [phone] 10/01/2018   Elray Mcgregor 10/01/2018, 10:51 AM

## 2018-10-01 NOTE — Progress Notes (Signed)
  Pt had 3 beat run v-tach. Paged MD to make aware of this change. Pt stable. Will continue to monitor.

## 2018-10-01 NOTE — Telephone Encounter (Signed)
Seems like admission was for hyponatremia and dysphagia and has shown esophageal stricture. Okay to proceed with PFTs if she is strong enough to perform. 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

## 2018-10-01 NOTE — Telephone Encounter (Signed)
Called pt's sistert and advised message from the provider. She understood and verbalized understanding. Nothing further is needed. She wants to hold off on doing anything until she leaves the hospital. Will hold on rheumatology referral and she will call of back when pt leaves the hospital. She did mention that pt was too weak and that she would hold off on the PFT as well. SHe will follow up after she leaves the hospital.

## 2018-10-01 NOTE — Progress Notes (Addendum)
Initial Nutrition Assessment  DOCUMENTATION CODES:   Non-severe (moderate) malnutrition in context of chronic illness  INTERVENTION:  - Will order daily multivitamin with minerals. - Will monitor for results of barium swallow and provide appropriate interventions or recommendations.   NUTRITION DIAGNOSIS:   Moderate Malnutrition related to chronic illness(difficulty swallowing) as evidenced by mild fat depletion, moderate fat depletion, mild muscle depletion, moderate muscle depletion.  GOAL:   Patient will meet greater than or equal to 90% of their needs  MONITOR:   Diet advancement, PO intake, Supplement acceptance, Weight trends, Labs  REASON FOR ASSESSMENT:   Malnutrition Screening Tool  ASSESSMENT:   78 y.o. female with past medical history of essential HTN and chronic lower extremity edema. She is under evaluation by pulmonology for interstitial lung disease diagnosed recently. Patient reports that she was given a course of steroids 3 weeks ago for respiratory symptoms and subsequently developed oral thrush. She was given a course of Diflucan and thrush improved but she continued to have difficulty swallowing (worse with solids than liquids). She has been feeling depressed and was started on citalopram and alprazolam on 2/7. She has not been compliant with her Spironolactone the last 2 weeks. Evaluation in the ED revealed severe hyponatremia with a sodium level of 114 mmol/l.  BMI indicates overweight status. Patient is NPO pending barium swallow. RN reports test to be around 1:00 PM today. Patient in the chair with daughter at bedside at the time of RD visit. Patient was dx with PNA in October and since that time has had a decreased appetite and difficulty with swallowing. She is able to drink liquids without issue and has mainly been consuming items such as jello, yogurt, and soups. She also drinks 1 bottle of Ensure/day and has been taking a multivitamin.   Patient denies  any pain with swallowing but does report feeling that solid items or items that are not as thin get stuck. When this happens she is able to take a sip of liquid and it resolves the sensation.   She denies any abdominal pain or nausea now or PTA but does report sometimes having a "gurggling" sensation that can cause her to feel like she needs to rush to the bathroom. This mainly occurs when she contains items that contain lactose.   Patient's daughter reports that patient weighed 173 lb in October and that PTA patient was down to 149 lb. This indicates 24 lb weight loss (14% body weight) in the past 4 months; significant for time. Per chart review, current weight is 151 lb and weight on 1/21 was 161 lb. This indicates 10 lb weight loss (6% body weight) in the past 1 month; significant for time frame.    Medications reviewed; 80 mg oral lasix BID 2/24 and 2/25, 2 g IV Mg sulfate x1 run 2/23. Labs reviewed; Na: 122 mmol/l, Cl: 88 mmol/l, BUN: 5 mg/dl, Ca: 8.4 mg/dl.      NUTRITION - FOCUSED PHYSICAL EXAM:    Most Recent Value  Orbital Region  Mild depletion  Upper Arm Region  Moderate depletion  Thoracic and Lumbar Region  Unable to assess  Buccal Region  Mild depletion  Temple Region  Mild depletion  Clavicle Bone Region  Mild depletion  Clavicle and Acromion Bone Region  Moderate depletion  Scapular Bone Region  Unable to assess  Dorsal Hand  No depletion  Patellar Region  Mild depletion  Anterior Thigh Region  Unable to assess  Posterior Calf Region  Mild depletion  Edema (RD Assessment)  Mild [BLE]  Hair  Reviewed  Eyes  Reviewed  Mouth  Reviewed  Skin  Reviewed  Nails  Reviewed       Diet Order:   Diet Order            Diet NPO time specified  Diet effective now              EDUCATION NEEDS:   Not appropriate for education at this time  Skin:  Skin Assessment: Reviewed RN Assessment  Last BM:  PTA/unknown  Height:   Ht Readings from Last 1 Encounters:   09/29/18 5\' 5"  (1.651 m)    Weight:   Wt Readings from Last 1 Encounters:  09/30/18 68.7 kg    Ideal Body Weight:  56.82 kg  BMI:  Body mass index is 25.2 kg/m.  Estimated Nutritional Needs:   Kcal:  1470-1645 kcal  Protein:  55-65 grams  Fluid:  >/= 1.6 L/day     Trenton Gammon, MS, RD, LDN, [facility] Inpatient Clinical Dietitian Pager # [phone] After hours/weekend pager # [phone]

## 2018-10-01 NOTE — Telephone Encounter (Signed)
Spoke with patient's daughter [name]. She stated that the patient was admitted into the hospital on 2/22 and she is not sure of when she will be discharged.   She wants to know if RA would be ok with her having her PFT while in the hospital. Advised her that I would ask RA to review her admission and see if she is strong enough to do the test. She verbalized understanding.   RA, please advise. Thanks!

## 2018-10-02 ENCOUNTER — Encounter (HOSPITAL_COMMUNITY): Payer: Self-pay | Admitting: Cardiology

## 2018-10-02 DIAGNOSIS — I10 Essential (primary) hypertension: Secondary | ICD-10-CM

## 2018-10-02 DIAGNOSIS — J849 Interstitial pulmonary disease, unspecified: Secondary | ICD-10-CM

## 2018-10-02 DIAGNOSIS — I50810 Right heart failure, unspecified: Secondary | ICD-10-CM

## 2018-10-02 DIAGNOSIS — E871 Hypo-osmolality and hyponatremia: Secondary | ICD-10-CM

## 2018-10-02 DIAGNOSIS — I272 Pulmonary hypertension, unspecified: Secondary | ICD-10-CM

## 2018-10-02 LAB — CBC
HCT: 40.1 % (ref 36.0–46.0)
Hemoglobin: 13.2 g/dL (ref 12.0–15.0)
MCH: 30.6 pg (ref 26.0–34.0)
MCHC: 32.9 g/dL (ref 30.0–36.0)
MCV: 93 fL (ref 80.0–100.0)
Platelets: 297 10*3/uL (ref 150–400)
RBC: 4.31 MIL/uL (ref 3.87–5.11)
RDW: 11.5 % (ref 11.5–15.5)
WBC: 2.5 10*3/uL — ABNORMAL LOW (ref 4.0–10.5)
nRBC: 0 % (ref 0.0–0.2)

## 2018-10-02 LAB — BASIC METABOLIC PANEL
Anion gap: 10 (ref 5–15)
BUN: 6 mg/dL — ABNORMAL LOW (ref 8–23)
CO2: 29 mmol/L (ref 22–32)
Calcium: 8.3 mg/dL — ABNORMAL LOW (ref 8.9–10.3)
Chloride: 88 mmol/L — ABNORMAL LOW (ref 98–111)
Creatinine, Ser: 0.49 mg/dL (ref 0.44–1.00)
GFR calc Af Amer: >60 mL/min (ref >60)
GFR calc non Af Amer: >60 mL/min (ref >60)
Glucose, Bld: 91 mg/dL (ref 70–99)
Potassium: 3.4 mmol/L — ABNORMAL LOW (ref 3.5–5.1)
Sodium: 127 mmol/L — ABNORMAL LOW (ref 135–145)

## 2018-10-02 LAB — SODIUM
Sodium: 125 mmol/L — ABNORMAL LOW (ref 135–145)
Sodium: 128 mmol/L — ABNORMAL LOW (ref 135–145)
Sodium: 134 mmol/L — ABNORMAL LOW (ref 135–145)

## 2018-10-02 MED ORDER — DEXTROSE 5 % IV SOLN
INTRAVENOUS | Status: DC
  Administered 2018-10-02: 01:00:00 via INTRAVENOUS

## 2018-10-02 MED ORDER — SPIRONOLACTONE 25 MG PO TABS
25.0000 mg | ORAL_TABLET | Freq: Every day | ORAL | Status: DC
  Administered 2018-10-03: 25 mg via ORAL
  Filled 2018-10-02: qty 1

## 2018-10-02 MED ORDER — SODIUM CHLORIDE 0.9 % IV SOLN
250.0000 mL | INTRAVENOUS | Status: DC
  Administered 2018-10-02: 250 mL via INTRAVENOUS

## 2018-10-02 MED ORDER — POTASSIUM CHLORIDE CRYS ER 20 MEQ PO TBCR
40.0000 meq | EXTENDED_RELEASE_TABLET | Freq: Once | ORAL | Status: AC
  Administered 2018-10-02: 40 meq via ORAL
  Filled 2018-10-02: qty 2

## 2018-10-02 MED ORDER — POLYETHYLENE GLYCOL 3350 17 G PO PACK
17.0000 g | PACK | ORAL | Status: DC | PRN
  Administered 2018-10-02: 17 g via ORAL
  Filled 2018-10-02: qty 1

## 2018-10-02 MED ORDER — SODIUM CHLORIDE 0.9 % IV SOLN: 250.0000 mL | INTRAVENOUS | Status: DC | PRN

## 2018-10-02 NOTE — Progress Notes (Signed)
PROGRESS NOTE  Sabrina Douglas WCH:[id number] DOB: Jul 06, 1941 DOA: 09/29/2018 PCP: Renaye Rakers, MD   LOS: 3 days   Brief Narrative / Interim history: 78 year old female with history of hypertension, chronic LE edema and interstitial lung disease admitted with hyponatremia to 114 without major symptoms, dysphagia and about 20 pounds weight loss in the months.  Recently started on Celexa.  Has not been taking spironolactone for 2 weeks.  Initially given IV normal saline bolus that made hyponatremia worse.  Neurology consulted.  Normal saline was stopped and started on 3% NaCl with improvement in hyponatremia.   In regards to interstitial lung disease and possible autoimmune process, no intervention inpatient per pulmonology, Dr. Marchelle Gearing but she could be a candidate for [facility] outpatient.  Patient's pulmonologist, Dr. Vassie Loll aware of patient's admission.  Echocardiogram without significant structural or functional abnormality except for more bubble severe pulmonary hypertension.  Cardiology consulted for this.  For dysphagia ysphagia, SLP consulted and barium swallow ordered and showed possible esophageal stricture.  Gastroenterology consulted on 2/25, planning EGD. Nutrition consulted recommended multivitamins and supplements.     Subjective: No major events overnight of this morning.  Feels better this morning.  Hypertonic saline discontinued and started normal saline overnight out of concern for overcorrection.  Denies chest pain, dyspnea, abdominal pain or dysuria.  Assessment & Plan: Principal Problem:   Hyponatremia Active Problems:   ILD (interstitial lung disease) (HCC)   Essential hypertension   Lower extremity edema   Dysphagia   Malnutrition of moderate degree  Hyponatremia: sodium 114 on admission (134 about a month ago).  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  Off Spironolactone for 2 weeks so unlikely cause.  Recently started on Celexa  which will contribute.  Morning cortisol and TSH within normal range.  Asymptomatic except for fatigue which could be due to ILD.  Status post hypertonic saline -Appreciate nephrology's help (Lasix, fluid restriction and follow-up) -Serum sodium every 4 hours -SSRI discontinued. -Hold Aldactone  Dysphagia: Oropharyngeal exam within normal.  Patient was started on ICS and developed thrush.  Treated with Diflucan.  Barium swallow concerning for esophageal stricture.  [facility] consulted and planning EGD. -Appreciate SLP input, mild to moderate aspiration risk. -Full liquid diet, medication crushed with pure  Interstitial lung disease: followed by pulmonology.  Has upcoming appointment next week.  Autoimmune work-up significant for speckled high ANA titer, elevated RNP CK and CK-MB which could be seen in MCTD.  Currently without significant respiratory symptoms.  Recently completed steroid taper.  -Outpatient pulmonary follow-up per pulmonology, Dr. Marchelle Gearing -Patient's pulmonologist, Dr. Vassie Loll aware.  Pulmonary hypertension/lower extremity edema: Echo this admission with EF greater than 65% and no significant structural functional abnormality other than concern for severe pulmonary hypertension. -Cardiology consulted  Essential hypertension: Normotensive -Continue beta-blocker -Hold Aldactone  Leukopenia: Stable. Folate and vitamin B12 within normal range. -Consider heme-onc consult.  Hypomagnesemia and hypokalemia -Replenish and recheck  Debilitation:  -PT/OT eval-Home health PT, home health aide and rolling walker with 5 inch wheels  Moderate malnutrition: reports about 20 pounds weight loss Wt Readings from Last 10 Encounters:  10/02/18 65 kg  09/18/18 68.3 kg  08/28/18 73 kg  -Consult nutrition  Depression/anxiety: Stable -Hold home Celexa in the setting of hyponatremia.  Scheduled Meds: . enoxaparin (LOVENOX) injection  40 mg Subcutaneous Daily  . feeding supplement  1  Container Oral TID BM  . furosemide  80 mg Oral BID  . metoprolol tartrate  25  mg Oral BID  . multivitamin with minerals  1 tablet Oral Daily   Continuous Infusions:  PRN Meds:.acetaminophen **OR** acetaminophen, albuterol, ALPRAZolam, hydrALAZINE, ondansetron **OR** ondansetron (ZOFRAN) IV, polyethylene glycol  DVT prophylaxis: Subcu Lovenox Code Status: Full code Family Communication: Daughter at bedside.  Discussed the above findings and plans Disposition Plan: Transfer to MedSurg.  Consultants:   Nephrology  Cardiology  Procedures:   None  Antimicrobials:  None  Objective: Vitals:   10/02/18 0600 10/02/18 0700 10/02/18 0800 10/02/18 0907  BP: (!) 116/43 (!) 126/56 (!) 138/55 (!) 146/42  Pulse: 71 84 86 (!) 102  Resp: 14 (!) 23 (!) 21   Temp:   97.8 F (36.6 C)   TempSrc:   Oral   SpO2: 96% 97% 98%   Weight:      Height:        Intake/Output Summary (Last 24 hours) at 10/02/2018 1308 Last data filed at 10/02/2018 3833 Gross per 24 hour  Intake 1190.48 ml  Output 500 ml  Net 690.48 ml   Filed Weights   09/29/18 1744 09/30/18 0500 10/02/18 0447  Weight: 67.4 kg 68.7 kg 65 kg    Examination:  GENERAL: Appears well. No acute distress.  HEENT: MMM.  Vision and Hearing grossly intact.  NECK: Supple.  No JVD.  LUNGS:  No IWOB. Good air movement. CTAB.  HEART:  RRR. Heart sounds normal.  ABD: Bowel sounds present. Soft. Non tender.  EXT:   no edema bilaterally.  Trace edema bilaterally SKIN: no apparent skin lesion.  NEURO: Awake, alert and oriented appropriately.  No gross deficit.  PSYCH: Calm. Normal affect.  Data Reviewed: I have independently reviewed following labs and imaging studies  CBC: Recent Labs  Lab 09/29/18 1434 09/30/18 0350 10/02/18 0805  WBC 2.7* 3.3* 2.5*  NEUTROABS 1.7  --   --   HGB 13.8 13.0 13.2  HCT 40.6 39.0 40.1  MCV 90.4 90.7 93.0  PLT 331 320 297   Basic Metabolic Panel: Recent Labs  Lab 09/29/18 1434  09/29/18 1823  09/30/18 0348 09/30/18 0355  10/01/18 0404  10/01/18 1550 10/01/18 1919 10/01/18 2342 10/02/18 0319 10/02/18 0805  NA 114* 111*   < >  --  117*   < > 123*   < > 125* 125* 134* 128* 127*  K 4.7 4.1  --   --  4.1  --  4.2  --   --   --   --   --  3.4*  CL 78* 77*  --   --  84*  --  88*  --   --   --   --   --  88*  CO2 26 26  --   --  26  --  27  --   --   --   --   --  29  GLUCOSE 120* 125*  --   --  121*  --  101*  --   --   --   --   --  91  BUN 6* 5*  --   --  <5*  --  5*  --   --   --   --   --  6*  CREATININE 0.43* 0.46  --   --  0.42*  --  0.45  --   --   --   --   --  0.49  CALCIUM 8.8* 8.2*  --   --  8.1*  --  8.4*  --   --   --   --   --  8.3*  MG  --   --   --  1.6*  --   --   --   --   --   --   --   --   --    < > = values in this interval not displayed.   GFR: Estimated Creatinine Clearance: 51.3 mL/min (by C-G formula based on SCr of 0.49 mg/dL). Liver Function Tests: Recent Labs  Lab 09/29/18 1434 10/01/18 0404  AST 83* 57*  ALT 41 31  ALKPHOS 73 62  BILITOT 0.6 0.4  PROT 7.6 6.6  ALBUMIN 3.7 3.1*   No results for input(s): LIPASE, AMYLASE in the last 168 hours. No results for input(s): AMMONIA in the last 168 hours. Coagulation Profile: No results for input(s): INR, PROTIME in the last 168 hours. Cardiac Enzymes: Recent Labs  Lab 10/01/18 0404  CKTOTAL 381*  CKMB 133.0*   BNP (last 3 results) No results for input(s): PROBNP in the last 8760 hours. HbA1C: No results for input(s): HGBA1C in the last 72 hours. CBG: No results for input(s): GLUCAP in the last 168 hours. Lipid Profile: No results for input(s): CHOL, HDL, LDLCALC, TRIG, CHOLHDL, LDLDIRECT in the last 72 hours. Thyroid Function Tests: Recent Labs    09/29/18 1823  TSH 2.154   Anemia Panel: Recent Labs    09/29/18 1823  VITAMINB12 375  FOLATE 18.8   Urine analysis:    Component Value Date/Time   COLORURINE YELLOW 09/29/2018 1434   APPEARANCEUR CLEAR  09/29/2018 1434   LABSPEC 1.017 09/29/2018 1434   PHURINE 7.0 09/29/2018 1434   GLUCOSEU 50 (A) 09/29/2018 1434   HGBUR NEGATIVE 09/29/2018 1434   BILIRUBINUR NEGATIVE 09/29/2018 1434   KETONESUR 20 (A) 09/29/2018 1434   PROTEINUR NEGATIVE 09/29/2018 1434   NITRITE NEGATIVE 09/29/2018 1434   LEUKOCYTESUR TRACE (A) 09/29/2018 1434   Sepsis Labs: Invalid input(s): PROCALCITONIN, LACTICIDVEN  Recent Results (from the past 240 hour(s))  MRSA PCR Screening     Status: None   Collection Time: 09/29/18  5:46 PM  Result Value Ref Range Status   MRSA by PCR NEGATIVE NEGATIVE Final    Comment:        The GeneXpert MRSA Assay (FDA approved for NASAL specimens only), is one component of a comprehensive MRSA colonization surveillance program. It is not intended to diagnose MRSA infection nor to guide or monitor treatment for MRSA infections. Performed at [facility], [address] [place], [place] [address]       Radiology Studies: Dg Esophagus W Single Cm (sol Or Thin Ba)  Result Date: 10/01/2018 CLINICAL DATA:  Dysphagia. EXAM: ESOPHOGRAM/BARIUM SWALLOW TECHNIQUE: Single contrast examination was performed using thin barium or water soluble. FLUOROSCOPY TIME:  Fluoroscopy Time:  1 minutes 36 seconds Radiation Exposure Index (if provided by the fluoroscopic device): 16.5 Number of Acquired Spot Images: 0 COMPARISON:  None. FINDINGS: The study is limited due to patient condition. The patient could not fully stand and could not drink quickly. There is altered peristalsis with slow transit of the barium through the esophagus. There is smooth narrowing of the distal esophagus throughout the study is which never completely opens up. No obvious mass. Limited views in the mucosa are unremarkable. Limited views of the stomach are normal. The patient could not tolerate swelling and tablet. IMPRESSION: 1. There is smooth narrowing of the distal esophagus which never opens up. The  findings are suspicious or a benign stricture/narrowing, resulting in the patient's symptoms. Peristalsis  is considered less likely as the finding is seen on multiple occasions and the distal esophagus did not open up despite multiple attempts. However, the patient could not tolerate a tablet for confirmation of a distal stricture. Electronically Signed   By: Gerome Sam III M.D   On: 10/01/2018 14:45    Havah Ammon T. [facility] Pager [phone]  If 7PM-7AM, please contact night-coverage [email or web address] Password [email or web address] 10/02/2018, 1:08 PM

## 2018-10-02 NOTE — Progress Notes (Signed)
Subjective: Interval History: has no complaint, feels better.  Objective: Vital signs in last 24 hours: Temp:  [97.3 F (36.3 C)-97.8 F (36.6 C)] 97.8 F (36.6 C) (02/25 0800) Pulse Rate:  [71-113] 102 (02/25 0907) Resp:  [13-33] 21 (02/25 0800) BP: (107-155)/(42-67) 146/42 (02/25 0907) SpO2:  [93 %-99 %] 98 % (02/25 0800) Weight:  [65 kg] 65 kg (02/25 0447) Weight change:   Intake/Output from previous day: 02/24 0701 - 02/25 0700 In: 1050.5 [P.O.:360; I.V.:690.5] Out: 1150 [Urine:1150] Intake/Output this shift: Total I/O In: 140 [I.V.:140] Out: -   General appearance: alert, cooperative, no distress and mildly obese Resp: rales bibasilar Cardio: S1, S2 normal and systolic murmur: systolic ejection 2/6, crescendo and decrescendo at 2nd left intercostal space GI: soft, non-tender; bowel sounds normal; no masses,  no organomegaly Extremities: edema 1+  Lab Results: Recent Labs    09/30/18 0350 10/02/18 0805  WBC 3.3* 2.5*  HGB 13.0 13.2  HCT 39.0 40.1  PLT 320 297   BMET:  Recent Labs    10/01/18 0404  10/02/18 0319 10/02/18 0805  NA 123*   < > 128* 127*  K 4.2  --   --  3.4*  CL 88*  --   --  88*  CO2 27  --   --  29  GLUCOSE 101*  --   --  91  BUN 5*  --   --  6*  CREATININE 0.45  --   --  0.49  CALCIUM 8.4*  --   --  8.3*   < > = values in this interval not displayed.   No results for input(s): PTH in the last 72 hours. Iron Studies: No results for input(s): IRON, TIBC, TRANSFERRIN, FERRITIN in the last 72 hours.  Studies/Results: Dg Esophagus W Single Cm (sol Or Thin Ba)  Result Date: 10/01/2018 CLINICAL DATA:  Dysphagia. EXAM: ESOPHOGRAM/BARIUM SWALLOW TECHNIQUE: Single contrast examination was performed using thin barium or water soluble. FLUOROSCOPY TIME:  Fluoroscopy Time:  1 minutes 36 seconds Radiation Exposure Index (if provided by the fluoroscopic device): 16.5 Number of Acquired Spot Images: 0 COMPARISON:  None. FINDINGS: The study is limited  due to patient condition. The patient could not fully stand and could not drink quickly. There is altered peristalsis with slow transit of the barium through the esophagus. There is smooth narrowing of the distal esophagus throughout the study is which never completely opens up. No obvious mass. Limited views in the mucosa are unremarkable. Limited views of the stomach are normal. The patient could not tolerate swelling and tablet. IMPRESSION: 1. There is smooth narrowing of the distal esophagus which never opens up. The findings are suspicious or a benign stricture/narrowing, resulting in the patient's symptoms. Peristalsis is considered less likely as the finding is seen on multiple occasions and the distal esophagus did not open up despite multiple attempts. However, the patient could not tolerate a tablet for confirmation of a distal stricture. Electronically Signed   By: Gerome Sam III M.D   On: 10/01/2018 14:45    I have reviewed the patient's current medications.  Assessment/Plan: 1 Hyponatremia  Improved. Suspect with trend that the 134 was spurious.  Will cont Lasix for 2 more doses,fluid restrict and follow. Suspect needs Tolvaptan 2 Interstitial lung dz 3 Depression  P lasix, fluid restrict  LOS: 3 days   Fayrene Fearing Ezariah Nace 10/02/2018,11:31 AM

## 2018-10-02 NOTE — Consult Note (Signed)
Referring Provider:  TH Primary Care Physician:  Renaye Rakers, MD Primary Gastroenterologist: Gentry Fitz  Reason for Consultation: Dysphagia, abnormal barium swallow.  HPI: Sabrina Douglas is a 78 y.o. female with past medical history of hypoxemia with possible interstitial lung disease, history of anxiety and depression admitted to the hospital for generalized weakness and difficulty swallowing.  Patient was recently treated with course of steroids for her interstitial lung disease and subsequently developed oral thrush which was treated with Diflucan.  Underwent barium swallow which showed benign appearing lower esophageal stricture.  GI was consulted for further evaluation.   Patient started noticing intermittent dysphagia since October 2019.  Symptoms are getting worse.  Complaining of sensation of food getting stuck in the throat.  Mostly solid food.  Few episodes of nausea and vomiting in the past.  Complaining of generalized weakness.  Complaining of baseline shortness of breath.  Pain, diarrhea or constipation.  Denies any blood in the stool or black stool.  No previous EGD or colonoscopy.  Brother was diagnosed with esophageal cancer and sister was diagnosed with colon cancer.  Patient had negative Cologuard this year per daughter.  Report not available to review.     Past Medical History:  Diagnosis Date  . Anxiety   . Depression   . Hyperlipidemia     History reviewed. No pertinent surgical history.  Prior to Admission medications   Medication Sig Start Date End Date Taking? Authorizing Provider  albuterol (PROVENTIL HFA;VENTOLIN HFA) 108 (90 Base) MCG/ACT inhaler Inhale 2 puffs into the lungs every 6 (six) hours as needed for wheezing or shortness of breath. 09/18/18  Yes Glenford Bayley, NP  ALPRAZolam Prudy Feeler) 0.25 MG tablet Take 0.125-0.25 mg by mouth 2 (two) times daily as needed for anxiety.    Yes [provider]  citalopram (CELEXA) 10 MG/5ML suspension Take  10 mg by mouth daily.   Yes [provider]  Glycerin-Hypromellose-PEG 400 ([facility] DRY EYE RELIEF) 0.2-0.2-1 % SOLN Place 2 drops into both eyes as needed (Dry eyes).   Yes [provider]  metoprolol tartrate (LOPRESSOR) 25 MG tablet Take 25 mg by mouth 2 (two) times daily.   Yes [provider]  Multiple Vitamin (MULTIVITAMIN WITH MINERALS) TABS tablet Take 1 tablet by mouth daily.   Yes [provider]  simvastatin (ZOCOR) 20 MG tablet Take 20 mg by mouth daily.   Yes [provider]  spironolactone (ALDACTONE) 25 MG tablet Take 25 mg by mouth daily.   Yes [provider]  nystatin (MYCOSTATIN) 100000 UNIT/ML suspension Take 5 mLs (500,000 Units total) by mouth 4 (four) times daily. For 1 week or until white plaque on tongue resolves Patient not taking: Reported on 09/29/2018 09/04/18   Little, Ambrose Finland, MD  predniSONE (DELTASONE) 10 MG tablet Take 4 tabs po daily x 2 days; then 3 tabs for 2 days; then 2 tabs for 2 days; then 1 tab for 2 days Patient not taking: Reported on 09/29/2018 09/18/18   Glenford Bayley, NP  Respiratory Therapy Supplies (FLUTTER) DEVI 1 Device by Does not apply route as needed. 09/19/18   Glenford Bayley, NP    Scheduled Meds: . enoxaparin (LOVENOX) injection  40 mg Subcutaneous Daily  . feeding supplement  1 Container Oral TID BM  . furosemide  80 mg Oral BID  . metoprolol tartrate  25 mg Oral BID  . multivitamin with minerals  1 tablet Oral Daily   Continuous Infusions: PRN Meds:.acetaminophen **OR** acetaminophen,  albuterol, ALPRAZolam, hydrALAZINE, ondansetron **OR** ondansetron (ZOFRAN) IV  Allergies as of 09/29/2018  . (No Known Allergies)    No family history on file.  Social History   Socioeconomic History  . Marital status: Divorced    Spouse name: Not on file  . Number of children: Not on file  . Years of education: Not on file  . Highest education level: Not on file  Occupational  History  . Not on file  Social Needs  . Financial resource strain: Not on file  . Food insecurity:    Worry: Not on file    Inability: Not on file  . Transportation needs:    Medical: Not on file    Non-medical: Not on file  Tobacco Use  . Smoking status: Never Smoker  . Smokeless tobacco: Never Used  Substance and Sexual Activity  . Alcohol use: Never    Frequency: Never  . Drug use: Not on file  . Sexual activity: Not on file  Lifestyle  . Physical activity:    Days per week: Not on file    Minutes per session: Not on file  . Stress: Not on file  Relationships  . Social connections:    Talks on phone: Not on file    Gets together: Not on file    Attends religious service: Not on file    Active member of club or organization: Not on file    Attends meetings of clubs or organizations: Not on file    Relationship status: Not on file  . Intimate partner violence:    Fear of current or ex partner: Not on file    Emotionally abused: Not on file    Physically abused: Not on file    Forced sexual activity: Not on file  Other Topics Concern  . Not on file  Social History Narrative  . Not on file   Review of Systems  Constitutional: Positive for weight loss. Negative for chills and fever.  HENT: Positive for tinnitus. Negative for hearing loss.   Eyes: Negative for blurred vision and double vision.  Respiratory: Positive for cough and shortness of breath.   Cardiovascular: Negative for chest pain and palpitations.  Gastrointestinal: Positive for heartburn, nausea and vomiting. Negative for abdominal pain, blood in stool, constipation and diarrhea.  Genitourinary: Negative for dysuria and urgency.  Musculoskeletal: Positive for joint pain and myalgias.  Skin: Negative for itching and rash.  Neurological: Negative for seizures and loss of consciousness.  Endo/Heme/Allergies: Does not bruise/bleed easily.  Psychiatric/Behavioral: Negative for substance abuse and suicidal  ideas.    Physical Exam: Vital signs: Vitals:   10/02/18 0800 10/02/18 0907  BP: (!) 138/55 (!) 146/42  Pulse: 86 (!) 102  Resp: (!) 21   Temp: 97.8 F (36.6 C)   SpO2: 98%    Last BM Date: 09/28/18 Physical Exam  Constitutional: She is oriented to person, place, and time. She appears well-developed and well-nourished. No distress.  HENT:  Head: Normocephalic and atraumatic.  Mouth/Throat: Oropharynx is clear and moist. No oropharyngeal exudate.  Eyes: EOM are normal. No scleral icterus.  Neck: Normal range of motion. Neck supple.  Cardiovascular: Normal rate.  Murmur heard. Pulmonary/Chest: Effort normal. No respiratory distress. She has rales.  Decreased breath sounds bilaterally  Abdominal: Soft. Bowel sounds are normal. She exhibits no distension. There is no abdominal tenderness. There is no rebound and no guarding.  Musculoskeletal: Normal range of motion.        General: No  edema.  Neurological: She is alert and oriented to person, place, and time.  Skin: Skin is warm. No erythema.  Psychiatric: She has a normal mood and affect. Judgment and thought content normal.  Vitals reviewed.   GI:  Lab Results: Recent Labs    09/29/18 1434 09/30/18 0350 10/02/18 0805  WBC 2.7* 3.3* 2.5*  HGB 13.8 13.0 13.2  HCT 40.6 39.0 40.1  PLT 331 320 297   BMET Recent Labs    09/30/18 0355  10/01/18 0404  10/01/18 2342 10/02/18 0319 10/02/18 0805  NA 117*   < > 123*   < > 134* 128* 127*  K 4.1  --  4.2  --   --   --  3.4*  CL 84*  --  88*  --   --   --  88*  CO2 26  --  27  --   --   --  29  GLUCOSE 121*  --  101*  --   --   --  91  BUN <5*  --  5*  --   --   --  6*  CREATININE 0.42*  --  0.45  --   --   --  0.49  CALCIUM 8.1*  --  8.4*  --   --   --  8.3*   < > = values in this interval not displayed.   LFT Recent Labs    10/01/18 0404  PROT 6.6  ALBUMIN 3.1*  AST 57*  ALT 31  ALKPHOS 62  BILITOT 0.4   PT/INR No results for input(s): LABPROT, INR in the  last 72 hours.   Studies/Results: Dg Esophagus W Single Cm (sol Or Thin Ba)  Result Date: 10/01/2018 CLINICAL DATA:  Dysphagia. EXAM: ESOPHOGRAM/BARIUM SWALLOW TECHNIQUE: Single contrast examination was performed using thin barium or water soluble. FLUOROSCOPY TIME:  Fluoroscopy Time:  1 minutes 36 seconds Radiation Exposure Index (if provided by the fluoroscopic device): 16.5 Number of Acquired Spot Images: 0 COMPARISON:  None. FINDINGS: The study is limited due to patient condition. The patient could not fully stand and could not drink quickly. There is altered peristalsis with slow transit of the barium through the esophagus. There is smooth narrowing of the distal esophagus throughout the study is which never completely opens up. No obvious mass. Limited views in the mucosa are unremarkable. Limited views of the stomach are normal. The patient could not tolerate swelling and tablet. IMPRESSION: 1. There is smooth narrowing of the distal esophagus which never opens up. The findings are suspicious or a benign stricture/narrowing, resulting in the patient's symptoms. Peristalsis is considered less likely as the finding is seen on multiple occasions and the distal esophagus did not open up despite multiple attempts. However, the patient could not tolerate a tablet for confirmation of a distal stricture. Electronically Signed   By: Gerome Sam III M.D   On: 10/01/2018 14:45    Impression/Plan: -Oropharyngeal dysphagia -Abnormal barium swallow -Family history of esophageal and colon cancer. -Mild elevated AST.  Could be transient.  Recommendations -------------------------- -EGD with dilation tomorrow. Procedure and risk including risk of perforation discussed in detail with daughter and patient.  They verbalized understanding. -Recheck LFTs in the morning.  Okay to liquid diet for now.  N.p.o. past midnight.  Risks (bleeding, infection, bowel perforation that could require surgery,  sedation-related changes in cardiopulmonary systems), benefits (identification and possible treatment of source of symptoms, exclusion of certain causes of symptoms), and alternatives (watchful waiting,  radiographic imaging studies, empiric medical treatment)  were explained to patient/family in detail and patient wishes to proceed.   LOS: 3 days   Kathi DerParag Ty Buntrock  MD, FACP 10/02/2018, 9:16 AM  Contact #  [phone]

## 2018-10-02 NOTE — Consult Note (Addendum)
Cardiology Consultation:   Patient ID: [name] MRN: [id number]; DOB: 1941-03-02  Admit date: 09/29/2018 Date of Consult: 10/02/2018  Primary Care Provider: Renaye Rakers, MD Primary Cardiologist: No primary care provider on file. NEW Primary Electrophysiologist:  None    Patient Profile:   Sabrina Douglas is a 78 y.o. female with a hx of ILD, HTN, chronic lower ext edema who is being seen today for the evaluation of Echo at the request of Dr. Alanda Slim on pt admitted on the 22nd due to generalized weakness and difficulty swallowing.  History of Present Illness:   Sabrina Douglas with recent hx of ILD, HTN and chronic lower ext edema, presented due to weakness and trouble swallowing after developing oral thrush on steroids. This continues despite diflucan.  She has had 20 lb wt loss in last few months.  On labs in ER she had Na level of 114.   She also has depression.  She was given 1 L of NS in ER.  Nephrology is following.    EKG:  The EKG was personally reviewed and demonstrates:  SR with LVH LAD similar to prior EKGs Telemetry:  Telemetry was personally reviewed and demonstrates:  SR to ST   Na up to 127, K+ 3.4, CL 88, C02 29, Cr 0.49 BUN 6, Cr 0.49 CK total 381 and CKMB 133 WBC of 2.5, Hgb 13.2, Plts 297  Echo 09/30/18 with EF >65%, no LV RWMA,   RV with RA-RV peak gradient increased at 66.9 mmHg consistent with probable severe pulmonary hypertension, mild AS, mod TR, trivial MR,    Feeling better today, no chest pain.  No FH of CAD except for a daughter with MI possible spasm with normal cors.  She has had tachycardia at times for years.   Past Medical History:  Diagnosis Date  . Anxiety   . Depression   . Hyperlipidemia     History reviewed. No pertinent surgical history.   Home Medications:  Prior to Admission medications   Medication Sig Start Date End Date Taking? Authorizing Provider  albuterol (PROVENTIL HFA;VENTOLIN HFA) 108 (90 Base) MCG/ACT inhaler Inhale 2 puffs  into the lungs every 6 (six) hours as needed for wheezing or shortness of breath. 09/18/18  Yes Glenford Bayley, NP  ALPRAZolam Prudy Feeler) 0.25 MG tablet Take 0.125-0.25 mg by mouth 2 (two) times daily as needed for anxiety.    Yes [provider]  citalopram (CELEXA) 10 MG/5ML suspension Take 10 mg by mouth daily.   Yes [provider]  Glycerin-Hypromellose-PEG 400 ([facility] DRY EYE RELIEF) 0.2-0.2-1 % SOLN Place 2 drops into both eyes as needed (Dry eyes).   Yes [provider]  metoprolol tartrate (LOPRESSOR) 25 MG tablet Take 25 mg by mouth 2 (two) times daily.   Yes [provider]  Multiple Vitamin (MULTIVITAMIN WITH MINERALS) TABS tablet Take 1 tablet by mouth daily.   Yes [provider]  simvastatin (ZOCOR) 20 MG tablet Take 20 mg by mouth daily.   Yes [provider]  spironolactone (ALDACTONE) 25 MG tablet Take 25 mg by mouth daily.   Yes [provider]  nystatin (MYCOSTATIN) 100000 UNIT/ML suspension Take 5 mLs (500,000 Units total) by mouth 4 (four) times daily. For 1 week or until white plaque on tongue resolves Patient not taking: Reported on 09/29/2018 09/04/18   Little, Sabrina Finland, MD  predniSONE (DELTASONE) 10 MG tablet Take 4 tabs po daily x 2 days; then 3 tabs for 2 days; then  2 tabs for 2 days; then 1 tab for 2 days Patient not taking: Reported on 09/29/2018 09/18/18   Glenford Bayley, NP  Respiratory Therapy Supplies (FLUTTER) DEVI 1 Device by Does not apply route as needed. 09/19/18   Glenford Bayley, NP    Inpatient Medications: Scheduled Meds: . enoxaparin (LOVENOX) injection  40 mg Subcutaneous Daily  . feeding supplement  1 Container Oral TID BM  . furosemide  80 mg Oral BID  . metoprolol tartrate  25 mg Oral BID  . multivitamin with minerals  1 tablet Oral Daily   Continuous Infusions:  PRN Meds: acetaminophen **OR** acetaminophen, albuterol, ALPRAZolam, hydrALAZINE, ondansetron **OR** ondansetron  (ZOFRAN) IV, polyethylene glycol  Allergies:   No Known Allergies  Social History:   Social History   Socioeconomic History  . Marital status: Divorced    Spouse name: Not on file  . Number of children: Not on file  . Years of education: Not on file  . Highest education level: Not on file  Occupational History  . Not on file  Social Needs  . Financial resource strain: Not on file  . Food insecurity:    Worry: Not on file    Inability: Not on file  . Transportation needs:    Medical: Not on file    Non-medical: Not on file  Tobacco Use  . Smoking status: Never Smoker  . Smokeless tobacco: Never Used  Substance and Sexual Activity  . Alcohol use: Never    Frequency: Never  . Drug use: Not on file  . Sexual activity: Not on file  Lifestyle  . Physical activity:    Days per week: Not on file    Minutes per session: Not on file  . Stress: Not on file  Relationships  . Social connections:    Talks on phone: Not on file    Gets together: Not on file    Attends religious service: Not on file    Active member of club or organization: Not on file    Attends meetings of clubs or organizations: Not on file    Relationship status: Not on file  . Intimate partner violence:    Fear of current or ex partner: Not on file    Emotionally abused: Not on file    Physically abused: Not on file    Forced sexual activity: Not on file  Other Topics Concern  . Not on file  Social History Narrative  . Not on file    Family History:    Family History  Problem Relation Age of Onset  . Sudden death Mother 56       sudden death post hysterectomy  . Prostate cancer Father   . Diabetes Father      ROS:  Please see the history of present illness. General:no colds or fevers, no weight changes Skin:no rashes or ulcers HEENT:no blurred vision, no congestion CV:see HPI PUL:see HPI GI:no diarrhea constipation or melena, no indigestion GU:no hematuria, no dysuria MS:no joint pain, no  claudication, lower ext edema improved with lasix  Neuro:no syncope, no lightheadedness Endo:no diabetes, no thyroid disease  All other ROS reviewed and negative.     Physical Exam/Data:   Vitals:   10/02/18 0600 10/02/18 0700 10/02/18 0800 10/02/18 0907  BP: (!) 116/43 (!) 126/56 (!) 138/55 (!) 146/42  Pulse: 71 84 86 (!) 102  Resp: 14 (!) 23 (!) 21   Temp:   97.8 F (36.6 C)   TempSrc:  Oral   SpO2: 96% 97% 98%   Weight:      Height:        Intake/Output Summary (Last 24 hours) at 10/02/2018 1444 Last data filed at 10/02/2018 [id number] Gross per 24 hour  Intake 1190.48 ml  Output 500 ml  Net 690.48 ml   Last 3 Weights 10/02/2018 09/30/2018 09/29/2018  Weight (lbs) 143 lb 4.8 oz 151 lb 7.3 oz 148 lb 9.4 oz  Weight (kg) 65 kg 68.7 kg 67.4 kg     Body mass index is 23.85 kg/m.  General:  Well nourished, well developed, in no acute distress HEENT: normal Lymph: no adenopathy Neck: no JVD Endocrine:  No thryomegaly Vascular: No carotid bruits; pedal pulses 2+ bilaterally  Cardiac:  normal S1, S2; RRR; no murmur gallup rub or click Lungs:  clear to auscultation bilaterally, no wheezing, rhonchi or rales  Abd: soft, nontender, no hepatomegaly  Ext: no edema Musculoskeletal:  No deformities, BUE and BLE strength normal and equal Skin: warm and dry  Neuro:  CNs 2-12 intact, no focal abnormalities noted Psych:  Normal affect   Relevant CV Studies: Echo 09/30/18 IMPRESSIONS    1. The left ventricle has hyperdynamic systolic function, with an ejection fraction of >65%. The cavity size was normal. Left ventricular diastolic parameters were normal No evidence of left ventricular regional wall motion abnormalities.  2. No evidence of left ventricular regional wall motion abnormalities.  3. The right ventricle has normal systolic function. The cavity was grossly normal. RA-RV peak gradient increased at 66.9 mmHg consistent with probable severe pulmonary hypertension. Could not  estimate CVP however.  4. The aortic valve is tricuspid Mild aortic annular calcification noted.  5. The mitral valve is normal in structure.  6. The tricuspid valve is normal in structure. Tricuspid valve regurgitation is moderate.  FINDINGS  Left Ventricle: The left ventricle has hyperdynamic systolic function, with an ejection fraction of >65%. The cavity size was normal. There is borderline increase in left ventricular wall thickness. Left ventricular diastolic parameters were normal No  evidence of left ventricular regional wall motion abnormalities.. Right Ventricle: The right ventricle has normal systolic function. The cavity was normal. There is right vetricular wall thickness was not assessed. Left Atrium: left atrial size was normal in size Right Atrium: right atrial size was normal in size. Interatrial Septum: No atrial level shunt detected by color flow Doppler. Pericardium: There is no evidence of pericardial effusion. There is a pericardial fat pad noted. Mitral Valve: The mitral valve is normal in structure. Mitral valve regurgitation is trivial by color flow Doppler. Tricuspid Valve: The tricuspid valve is normal in structure. Tricuspid valve regurgitation is moderate by color flow Doppler. Aortic Valve: The aortic valve is tricuspid Aortic valve regurgitation was not visualized by color flow Doppler. Mild aortic annular calcification noted. Pulmonic Valve: The pulmonic valve was not well visualized. Pulmonic valve regurgitation was not assessed by color flow Doppler. Venous: The inferior vena cava was not well visualized.     Laboratory Data:  Chemistry Recent Labs  Lab 09/30/18 0355  10/01/18 0404  10/01/18 2342 10/02/18 0319 10/02/18 0805  NA 117*   < > 123*   < > 134* 128* 127*  K 4.1  --  4.2  --   --   --  3.4*  CL 84*  --  88*  --   --   --  88*  CO2 26  --  27  --   --   --  29  GLUCOSE 121*  --  101*  --   --   --  91  BUN <5*  --  5*  --   --   --  6*    CREATININE 0.42*  --  0.45  --   --   --  0.49  CALCIUM 8.1*  --  8.4*  --   --   --  8.3*  GFRNONAA >60  --  >60  --   --   --  >60  GFRAA >60  --  >60  --   --   --  >60  ANIONGAP 7  --  8  --   --   --  10   < > = values in this interval not displayed.    Recent Labs  Lab 09/29/18 1434 10/01/18 0404  PROT 7.6 6.6  ALBUMIN 3.7 3.1*  AST 83* 57*  ALT 41 31  ALKPHOS 73 62  BILITOT 0.6 0.4   Hematology Recent Labs  Lab 09/29/18 1434 09/30/18 0350 10/02/18 0805  WBC 2.7* 3.3* 2.5*  RBC 4.49 4.30 4.31  HGB 13.8 13.0 13.2  HCT 40.6 39.0 40.1  MCV 90.4 90.7 93.0  MCH 30.7 30.2 30.6  MCHC 34.0 33.3 32.9  RDW 11.1* 11.1* 11.5  PLT 331 320 297   Cardiac EnzymesNo results for input(s): TROPONINI in the last 168 hours.  Recent Labs  Lab 09/29/18 1439  TROPIPOC 0.02    BNPNo results for input(s): BNP, PROBNP in the last 168 hours.  DDimer No results for input(s): DDIMER in the last 168 hours.  Radiology/Studies:  Dg Chest 2 View  Result Date: 09/29/2018 CLINICAL DATA:  Shortness of breath. EXAM: CHEST - 2 VIEW COMPARISON:  Chest x-ray dated September 04, 2018. FINDINGS: The heart size and mediastinal contours are within normal limits. Normal pulmonary vascularity. Unchanged interstitial opacities at the left greater than right lung bases. No focal consolidation, pleural effusion, or pneumothorax. No acute osseous abnormality. Distended stomach. IMPRESSION: 1. Unchanged bibasilar interstitial lung disease.  No acute finding. Electronically Signed   By: Obie Dredge M.D.   On: 09/29/2018 14:46   Dg Esophagus W Single Cm (sol Or Thin Ba)  Result Date: 10/01/2018 CLINICAL DATA:  Dysphagia. EXAM: ESOPHOGRAM/BARIUM SWALLOW TECHNIQUE: Single contrast examination was performed using thin barium or water soluble. FLUOROSCOPY TIME:  Fluoroscopy Time:  1 minutes 36 seconds Radiation Exposure Index (if provided by the fluoroscopic device): 16.5 Number of Acquired Spot Images: 0  COMPARISON:  None. FINDINGS: The study is limited due to patient condition. The patient could not fully stand and could not drink quickly. There is altered peristalsis with slow transit of the barium through the esophagus. There is smooth narrowing of the distal esophagus throughout the study is which never completely opens up. No obvious mass. Limited views in the mucosa are unremarkable. Limited views of the stomach are normal. The patient could not tolerate swelling and tablet. IMPRESSION: 1. There is smooth narrowing of the distal esophagus which never opens up. The findings are suspicious or a benign stricture/narrowing, resulting in the patient's symptoms. Peristalsis is considered less likely as the finding is seen on multiple occasions and the distal esophagus did not open up despite multiple attempts. However, the patient could not tolerate a tablet for confirmation of a distal stricture. Electronically Signed   By: Gerome Sam III M.D   On: 10/01/2018 14:45    Assessment and Plan:   1. 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 Delton See to see. 2. 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  3. HTN on BB and aldactone ILD Autoimmune work-up significant for speckled high ANA titer which could be seen in MCTD, Sjogren's or PSS.  Recently completed steroid taper.    For questions or updates, please contact [facility] Please consult [email or web address] for contact info under   Signed, Nada Boozer, NP  10/02/2018 2:44 PM   The patient was seen, examined and discussed with Nada Boozer, NP and I agree with the above.   78 y.o. female with a hx of interstitial lung disease, HTN, chronic lower ext edema who was admitted for the evaluation of generalized weakness and difficulty swallowing after developing oral thrush on steroids, with significant weight loss recently, found to be hyponatremic of 114 on admission, initial Na level of 114. She was given NS in ER, then  switched to 3% saline, her sodium has improved up to 134 now slightly down to 127. Nephrology is following.    The patient was fully independent and active till October 2019 when she was diagnosed with pneumonia with significant functional deterioration, after which she was diagnosed with interstitial lung disease and was slowly getting worse to the point that she is only able to do few steps before she gets short of breath.  Her physical exam shows mildly elevated JVDs, 3 out of 6 systolic murmur with prominent RV S3, no crackles over the lungs, minimal lower extremity edema. Her chest x-ray shows Unchanged bibasilar interstitial lung disease.  No acute finding. I have reviewed her echocardiogram and it shows LVEF of 70%, more normal diastolic function, moderately dilated right ventricle with normal systolic function and elevated RVSP of 63 mmHg.  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 twice daily, if recurrent might need tolvaptan, add spironolactone 25 mg po daily.  Order V/Q scan to rule out chronic thromboembolic disease as well as PFTs Outpatient referral to [facility] on discharge for evaluation and potential therapy of pulmonary hypertension  Tobias Alexander, MD 10/02/2018

## 2018-10-02 NOTE — Progress Notes (Signed)
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

## 2018-10-03 ENCOUNTER — Encounter (HOSPITAL_COMMUNITY): Payer: Self-pay

## 2018-10-03 ENCOUNTER — Encounter (HOSPITAL_COMMUNITY): Payer: Self-pay | Admitting: Anesthesiology

## 2018-10-03 ENCOUNTER — Inpatient Hospital Stay (HOSPITAL_COMMUNITY): Payer: Medicare Other

## 2018-10-03 ENCOUNTER — Encounter (HOSPITAL_COMMUNITY)
Admission: EM | Disposition: A | Discharge status: Home Health | Payer: Self-pay | Source: Home / Self Care | Attending: Student

## 2018-10-03 ENCOUNTER — Telehealth (HOSPITAL_COMMUNITY): Payer: Self-pay

## 2018-10-03 LAB — CBC
HCT: 42.9 % (ref 36.0–46.0)
Hemoglobin: 14.4 g/dL (ref 12.0–15.0)
MCH: 30.9 pg (ref 26.0–34.0)
MCHC: 33.6 g/dL (ref 30.0–36.0)
MCV: 92.1 fL (ref 80.0–100.0)
Platelets: 336 10*3/uL (ref 150–400)
RBC: 4.66 MIL/uL (ref 3.87–5.11)
RDW: 11.7 % (ref 11.5–15.5)
WBC: 3.2 10*3/uL — ABNORMAL LOW (ref 4.0–10.5)
nRBC: 0 % (ref 0.0–0.2)

## 2018-10-03 LAB — RENAL FUNCTION PANEL
Albumin: 3.6 g/dL (ref 3.5–5.0)
Anion gap: 13 (ref 5–15)
BUN: 6 mg/dL — ABNORMAL LOW (ref 8–23)
CO2: 31 mmol/L (ref 22–32)
Calcium: 9.1 mg/dL (ref 8.9–10.3)
Chloride: 82 mmol/L — ABNORMAL LOW (ref 98–111)
Creatinine, Ser: 0.54 mg/dL (ref 0.44–1.00)
GFR calc Af Amer: >60 mL/min (ref >60)
GFR calc non Af Amer: >60 mL/min (ref >60)
Glucose, Bld: 107 mg/dL — ABNORMAL HIGH (ref 70–99)
Phosphorus: 2.6 mg/dL (ref 2.5–4.6)
Potassium: 4 mmol/L (ref 3.5–5.1)
Sodium: 126 mmol/L — ABNORMAL LOW (ref 135–145)

## 2018-10-03 LAB — SODIUM: Sodium: 127 mmol/L — ABNORMAL LOW (ref 135–145)

## 2018-10-03 LAB — HEPATIC FUNCTION PANEL
ALT: 32 U/L (ref 0–44)
AST: 72 U/L — ABNORMAL HIGH (ref 15–41)
Albumin: 3.6 g/dL (ref 3.5–5.0)
Alkaline Phosphatase: 65 U/L (ref 38–126)
Bilirubin, Direct: 0.1 mg/dL (ref 0.0–0.2)
Indirect Bilirubin: 0.5 mg/dL (ref 0.3–0.9)
Total Bilirubin: 0.6 mg/dL (ref 0.3–1.2)
Total Protein: 7.4 g/dL (ref 6.5–8.1)

## 2018-10-03 SURGERY — ESOPHAGOGASTRODUODENOSCOPY (EGD) WITH PROPOFOL
Anesthesia: Monitor Anesthesia Care

## 2018-10-03 MED ORDER — TECHNETIUM TO 99M ALBUMIN AGGREGATED
4.4000 | Freq: Once | INTRAVENOUS | Status: AC | PRN
  Administered 2018-10-03: 4.4 via INTRAVENOUS

## 2018-10-03 MED ORDER — LORAZEPAM 2 MG/ML IJ SOLN
0.5000 mg | Freq: Once | INTRAMUSCULAR | Status: AC
  Administered 2018-10-04: 0.5 mg via INTRAVENOUS
  Filled 2018-10-03: qty 1

## 2018-10-03 MED ORDER — FUROSEMIDE 40 MG PO TABS
40.0000 mg | ORAL_TABLET | Freq: Two times a day (BID) | ORAL | Status: DC
  Administered 2018-10-03 – 2018-10-05 (×3): 40 mg via ORAL
  Filled 2018-10-03 (×3): qty 1

## 2018-10-03 MED ORDER — DEMECLOCYCLINE HCL 150 MG PO TABS
150.0000 mg | ORAL_TABLET | Freq: Two times a day (BID) | ORAL | Status: DC
  Administered 2018-10-03 – 2018-10-04 (×3): 150 mg via ORAL
  Filled 2018-10-03 (×5): qty 1

## 2018-10-03 MED ORDER — TECHNETIUM TC 99M DIETHYLENETRIAME-PENTAACETIC ACID
30.6000 | Freq: Once | INTRAVENOUS | Status: AC | PRN
  Administered 2018-10-03: 30.6 via RESPIRATORY_TRACT

## 2018-10-03 NOTE — Progress Notes (Signed)
[facility]                                                                              Patient Demographics  Sabrina Douglas, is a 78 y.o. female, DOB - [birth year], EKC:[id number]  Admit date - 09/29/2018   Admitting Physician Osvaldo Shipper, MD  Outpatient Primary MD for the patient is Renaye Rakers, MD  Outpatient specialists:   LOS - 4  days   Medical records reviewed and are as summarized below:    Chief Complaint  Patient presents with  . not eating or drinking       Brief summary   78 year old female with history of hypertension, chronic LE edema and interstitial lung disease admitted with hyponatremia to 114 without major symptoms, dysphagia and about 20 pounds weight loss in the months.  Recently started on Celexa.  Has not been taking spironolactone for 2 weeks.  Initially given IV normal saline bolus that made hyponatremia worse.    Nephrology was consulted.  Normal saline was stopped and started on 3% NaCl with improvement in hyponatremia.  In regards to interstitial lung disease and possible autoimmune process, no intervention inpatient per pulmonology, Dr. Marchelle Gearing but she could be a candidate for [facility] outpatient.  Patient's pulmonologist, Dr. Vassie Loll aware of patient's admission. Echocardiogram without significant structural or functional abnormality except for more bubble severe pulmonary hypertension.  Cardiology consulted for this. For dysphagia  SLP was consulted and barium swallow ordered and showed possible esophageal stricture.  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  Dysphagia -SLP consulted, barium swallow showed possible esophageal stricture -GI consulted, plan for EGD, canceled today due to hyponatremia    Essential hypertension -Currently stable, continue beta-blocker, on Lasix  -Holding Aldactone  Generalized debility -PT recommended home health PT  Moderate protein calorie malnutrition -Continue nutritional supplements  Depression/anxiety -Currently stable, SSRIs held  Code Status: Full code DVT Prophylaxis:  Lovenox  Family Communication: Discussed in detail with the patient, all imaging results, lab results explained to the patient, 2 daughters at the bedside   Disposition Plan: Once EGD completed, patient started on diet, will DC home  Time Spent in minutes 35 minutes  Procedures:  None  Consultants:   GI Cardiology   Antimicrobials:   Anti-infectives (From admission, onward)   Start     Dose/Rate Route Frequency Ordered Stop   10/03/18 1200  demeclocycline (DECLOMYCIN) tablet 150 mg     150 mg Oral Every 12 hours 10/03/18 1048            Medications  Scheduled Meds: . demeclocycline  150 mg Oral Q12H  . enoxaparin (LOVENOX) injection  40 mg Subcutaneous Daily  .  feeding supplement  1 Container Oral TID BM  . furosemide  40 mg Oral BID  . LORazepam  0.5 mg Intravenous Once  . metoprolol tartrate  25 mg Oral BID  . multivitamin with minerals  1 tablet Oral Daily  . spironolactone  25 mg Oral Daily   Continuous Infusions: PRN Meds:.acetaminophen **OR** acetaminophen, albuterol, ALPRAZolam, hydrALAZINE, ondansetron **OR** ondansetron (ZOFRAN) IV, polyethylene glycol      Subjective:   Sabrina Douglas was seen and examined today.  Patient denies dizziness, chest pain, shortness of breath, abdominal  pain, N/V/D/C, new weakness, numbess, tingling. No acute events overnight.    Objective:   Vitals:   10/02/18 2208 10/03/18 0635 10/03/18 1110 10/03/18 1353  BP: 103/66 (!) 115/50 139/80 123/79  Pulse: 98 (!) 103 (!) 113 88  Resp: 16 16  18   Temp: 98.2 F (36.8 C) 98.1 F (36.7 C)  97.6 F (36.4 C)  TempSrc: Oral Oral  Oral  SpO2: 96% 98% 97% 93%  Weight:      Height:        Intake/Output Summary (Last 24 hours) at 10/03/2018 1405 Last data filed at 10/03/2018 1218 Gross per 24 hour  Intake 1045 ml  Output 550 ml  Net 495 ml     Wt Readings from Last 3 Encounters:  10/02/18 65 kg  09/18/18 68.3 kg  08/28/18 73 kg     Exam  General: Alert and oriented x 3, NAD  Eyes:  HEENT:  Atraumatic, normocephalic  Cardiovascular: S1 S2 auscultated,  Regular rate and rhythm.  Respiratory: Clear to auscultation bilaterally  Gastrointestinal: Soft, nontender, nondistended, + bowel sounds  Ext: no pedal edema bilaterally  Neuro: No new deficits  Musculoskeletal: No digital cyanosis, clubbing  Skin: No rashes  Psych: Normal affect and demeanor, alert and oriented x3    Data Reviewed:  I have personally reviewed following labs and imaging studies  Micro Results Recent Results (from the past 240 hour(s))  MRSA PCR Screening     Status: None   Collection Time: 09/29/18  5:46 PM  Result Value Ref Range Status   MRSA by PCR NEGATIVE NEGATIVE Final    Comment:        The GeneXpert MRSA Assay (FDA approved for NASAL specimens only), is one component of a comprehensive MRSA colonization surveillance program. It is not intended to diagnose MRSA infection nor to guide or monitor treatment for MRSA infections. Performed at [facility], [address] [place], [place] [address]     Radiology Reports Dg Chest 2 View  Result Date: 09/29/2018 CLINICAL DATA:  Shortness of breath. EXAM: CHEST - 2 VIEW COMPARISON:  Chest x-ray dated September 04, 2018. FINDINGS: The heart size and mediastinal contours are within normal limits. Normal pulmonary vascularity. Unchanged interstitial opacities at the left greater than right lung bases. No focal consolidation, pleural effusion, or pneumothorax. No acute osseous abnormality. Distended stomach. IMPRESSION: 1. Unchanged bibasilar interstitial lung disease.  No acute finding. Electronically Signed   By: Obie DredgeWilliam T Derry M.D.   On: 09/29/2018 14:46   Dg Chest 2 View  Result Date: 09/04/2018 CLINICAL DATA:  Shortness of breath EXAM: CHEST - 2 VIEW COMPARISON:  08/28/17 FINDINGS: Low volume chest with interstitial opacities on the left more than right, present since at least 05/23/2018 There is no edema, consolidation, effusion, or pneumothorax. Normal heart size and mediastinal contours. IMPRESSION: Low volume chest and interstitial opacities without progression since most remote chest x-ray 05/23/2018. This is  most likely scarring or fibrosis. No acute finding. Electronically Signed   By: Marnee Spring M.D.   On: 09/04/2018 08:54   Ct Angio Chest Pe W/cm &/or Wo Cm  Result Date: 09/04/2018 CLINICAL DATA:  Shortness of breath. EXAM: CT ANGIOGRAPHY CHEST WITH CONTRAST TECHNIQUE: Multidetector CT imaging of the chest was performed using the standard protocol during bolus administration of intravenous contrast. Multiplanar CT image reconstructions and MIPs were obtained to evaluate the vascular anatomy. CONTRAST:  ISOVUE-370 IOPAMIDOL (ISOVUE-370) INJECTION 76% COMPARISON:  Chest x-ray from same day. FINDINGS: Cardiovascular: Satisfactory opacification of the pulmonary arteries to the segmental level. No evidence of pulmonary embolism. Mild right heart enlargement. No pericardial effusion. No thoracic aortic aneurysm or dissection. Mediastinum/Nodes: No enlarged mediastinal, hilar, or axillary lymph nodes. Mild enlargement of the right thyroid lobe without focal abnormality. The trachea and esophagus  demonstrate no significant findings. Lungs/Pleura: Lower lobe predominant volume loss, bronchiectasis, ground-glass opacity, and peribronchovascular thickening. Scarring in the lingula. No focal consolidation, pleural effusion, or pneumothorax. 5 mm perifissural nodule in the left upper lobe (series 6, image 29). Upper Abdomen: No acute abnormality. Musculoskeletal: No chest wall abnormality. No acute or significant osseous findings. Review of the MIP images confirms the above findings. IMPRESSION: 1. No evidence of pulmonary embolism. No acute intrathoracic process. 2. Lower lobe predominant volume loss, bronchiectasis, ground-glass opacity, and peribronchovascular thickening, suspicious for fibrotic nonspecific interstitial pneumonia. Recommend non-emergent high-resolution chest CT for further evaluation. 3. 5 mm perifissural nodule in the left upper lobe. No follow-up needed if patient is low-risk. Non-contrast chest CT can be considered in 12 months if patient is high-risk. This recommendation follows the consensus statement: Guidelines for Management of Incidental Pulmonary Nodules Detected on CT Images: From the Fleischner Society 2017; Radiology 2017; 284:228-243. Electronically Signed   By: Obie Dredge M.D.   On: 09/04/2018 13:08   Ct Chest High Resolution  Result Date: 09/10/2018 CLINICAL DATA:  78 year old female with history of unresolved pneumonia. Follow-up study. EXAM: CT CHEST WITHOUT CONTRAST TECHNIQUE: Multidetector CT imaging of the chest was performed following the standard protocol without intravenous contrast. High resolution imaging of the lungs, as well as inspiratory and expiratory imaging, was performed. COMPARISON:  Chest CT 09/04/2018. FINDINGS: Cardiovascular: Heart size is normal. There is no significant pericardial fluid, thickening or pericardial calcification. Aortic atherosclerosis. No definite coronary artery calcifications. Mediastinum/Nodes: No pathologically enlarged  mediastinal or hilar lymph nodes. Please note that accurate exclusion of hilar adenopathy is limited on noncontrast CT scans. Esophagus is unremarkable in appearance. No axillary lymphadenopathy. Lungs/Pleura: High-resolution images demonstrate patchy areas of ground-glass attenuation, septal thickening, severe thickening of the peribronchovascular interstitium, cylindrical bronchiectasis and peripheral bronchiolectasis in the lungs bilaterally, most evident in the lower lobes. These findings have a definitive craniocaudal gradient. No honeycombing is identified. Inspiratory and expiratory imaging is unremarkable. No acute consolidative airspace disease. No pleural effusions. No definite suspicious appearing pulmonary nodules or masses are noted. Upper Abdomen: 1.5 cm exophytic low-attenuation lesion in the upper pole of the left kidney, incompletely characterized on today's noncontrast CT examination, but similar to the prior study and statistically likely a cyst. Musculoskeletal: There are no aggressive appearing lytic or blastic lesions noted in the visualized portions of the skeleton. IMPRESSION: 1. 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 Repeat high-resolution chest CT is recommended in 12 months to assess for temporal changes in the appearance of the lung parenchyma.  2. Aortic atherosclerosis. Aortic Atherosclerosis (ICD10-I70.0). Electronically Signed   By: Trudie Reed M.D.   On: 09/10/2018 08:17   Nm Pulmonary Perf And Vent  Result Date: 10/03/2018 CLINICAL DATA:  Shortness of Breath EXAM: NUCLEAR MEDICINE VENTILATION - PERFUSION LUNG SCAN VIEWS: Anterior, posterior, left lateral, right lateral, RPO, LPO, RAO, LAO-ventilation and perfusion RADIOPHARMACEUTICALS:  30.6 mCi of Tc-35m DTPA aerosol inhalation and 4.4 mCi Tc74m MAA IV COMPARISON:  Chest radiograph September 29, 2018 FINDINGS:  Ventilation: Radiotracer uptake is homogeneous and symmetric bilaterally. No ventilation defects are evident. Perfusion: Radiotracer uptake is homogeneous and symmetric bilaterally. No perfusion defects are evident. IMPRESSION: No appreciable ventilation or perfusion defects. Very low probability of pulmonary embolus. Electronically Signed   By: Bretta Bang III M.D.   On: 10/03/2018 14:01   Dg Esophagus W Single Cm (sol Or Thin Ba)  Result Date: 10/01/2018 CLINICAL DATA:  Dysphagia. EXAM: ESOPHOGRAM/BARIUM SWALLOW TECHNIQUE: Single contrast examination was performed using thin barium or water soluble. FLUOROSCOPY TIME:  Fluoroscopy Time:  1 minutes 36 seconds Radiation Exposure Index (if provided by the fluoroscopic device): 16.5 Number of Acquired Spot Images: 0 COMPARISON:  None. FINDINGS: The study is limited due to patient condition. The patient could not fully stand and could not drink quickly. There is altered peristalsis with slow transit of the barium through the esophagus. There is smooth narrowing of the distal esophagus throughout the study is which never completely opens up. No obvious mass. Limited views in the mucosa are unremarkable. Limited views of the stomach are normal. The patient could not tolerate swelling and tablet. IMPRESSION: 1. There is smooth narrowing of the distal esophagus which never opens up. The findings are suspicious or a benign stricture/narrowing, resulting in the patient's symptoms. Peristalsis is considered less likely as the finding is seen on multiple occasions and the distal esophagus did not open up despite multiple attempts. However, the patient could not tolerate a tablet for confirmation of a distal stricture. Electronically Signed   By: Gerome Sam III M.D   On: 10/01/2018 14:45    Lab Data:  CBC: Recent Labs  Lab 09/29/18 1434 09/30/18 0350 10/02/18 0805 10/03/18 0629  WBC 2.7* 3.3* 2.5* 3.2*  NEUTROABS 1.7  --   --   --   HGB 13.8 13.0  13.2 14.4  HCT 40.6 39.0 40.1 42.9  MCV 90.4 90.7 93.0 92.1  PLT 331 320 297 336   Basic Metabolic Panel: Recent Labs  Lab 09/29/18 1823  09/30/18 0348 09/30/18 0355  10/01/18 0404  10/01/18 2342 10/02/18 0319 10/02/18 0805 10/02/18 1921 10/03/18 0629  NA 111*   < >  --  117*   < > 123*   < > 134* 128* 127* 125* 126*  K 4.1  --   --  4.1  --  4.2  --   --   --  3.4*  --  4.0  CL 77*  --   --  84*  --  88*  --   --   --  88*  --  82*  CO2 26  --   --  26  --  27  --   --   --  29  --  31  GLUCOSE 125*  --   --  121*  --  101*  --   --   --  91  --  107*  BUN 5*  --   --  <5*  --  5*  --   --   --  6*  --  6*  CREATININE 0.46  --   --  0.42*  --  0.45  --   --   --  0.49  --  0.54  CALCIUM 8.2*  --   --  8.1*  --  8.4*  --   --   --  8.3*  --  9.1  MG  --   --  1.6*  --   --   --   --   --   --   --   --   --   PHOS  --   --   --   --   --   --   --   --   --   --   --  2.6   < > = values in this interval not displayed.   GFR: Estimated Creatinine Clearance: 51.3 mL/min (by C-G formula based on SCr of 0.54 mg/dL). Liver Function Tests: Recent Labs  Lab 09/29/18 1434 10/01/18 0404 10/03/18 0629  AST 83* 57* 72*  ALT 41 31 32  ALKPHOS 73 62 65  BILITOT 0.6 0.4 0.6  PROT 7.6 6.6 7.4  ALBUMIN 3.7 3.1* 3.6  3.6   No results for input(s): LIPASE, AMYLASE in the last 168 hours. No results for input(s): AMMONIA in the last 168 hours. Coagulation Profile: No results for input(s): INR, PROTIME in the last 168 hours. Cardiac Enzymes: Recent Labs  Lab 10/01/18 0404  CKTOTAL 381*  CKMB 133.0*   BNP (last 3 results) No results for input(s): PROBNP in the last 8760 hours. HbA1C: No results for input(s): HGBA1C in the last 72 hours. CBG: No results for input(s): GLUCAP in the last 168 hours. Lipid Profile: No results for input(s): CHOL, HDL, LDLCALC, TRIG, CHOLHDL, LDLDIRECT in the last 72 hours. Thyroid Function Tests: No results for input(s): TSH, T4TOTAL, FREET4,  T3FREE, THYROIDAB in the last 72 hours. Anemia Panel: No results for input(s): VITAMINB12, FOLATE, FERRITIN, TIBC, IRON, RETICCTPCT in the last 72 hours. Urine analysis:    Component Value Date/Time   COLORURINE YELLOW 09/29/2018 1434   APPEARANCEUR CLEAR 09/29/2018 1434   LABSPEC 1.017 09/29/2018 1434   PHURINE 7.0 09/29/2018 1434   GLUCOSEU 50 (A) 09/29/2018 1434   HGBUR NEGATIVE 09/29/2018 1434   BILIRUBINUR NEGATIVE 09/29/2018 1434   KETONESUR 20 (A) 09/29/2018 1434   PROTEINUR NEGATIVE 09/29/2018 1434   NITRITE NEGATIVE 09/29/2018 1434   LEUKOCYTESUR TRACE (A) 09/29/2018 1434     Wilda Wetherell M.D. [facility] 10/03/2018, 2:05 PM  Pager: [phone] Between 7am to 7pm - call Pager - [phone]  After 7pm go to [email or web address] - password [email or web address]  Call night coverage person covering after 7pm

## 2018-10-03 NOTE — Progress Notes (Signed)
Physical Therapy Treatment Patient Details Name: Sabrina Douglas MRN: [id number] DOB: [birth year] Today's Date: 10/03/2018    History of Present Illness  Sabrina Douglas is a 78 y.o. female with a past medical history of essential hypertension, chronic lower extremity edema who is under evaluation by pulmonology for interstitial lung disease diagnosed recently. Admitted with weakness, difficulty swallowing, 20# weight loss and severe hyponatremia with a sodium level of 114.     PT Comments    Pt with improved ambulation distance today, but required short standing rest break to recover dyspnea and fatigue. Pt reporting to PT feeling more winded today vs previously. Pt performed LE exercises well, also limited by fatigue. PT continuing to recommend HHPT to address mobility deficits. Will continue to follow acutely.    Follow Up Recommendations  Home health PT;Supervision - Intermittent;Other (comment)(HH aide )     Equipment Recommendations  Other (comment)(Pt's daughter is planning on getting pt a rollator through insurance, and will be picking it up off-site)    Recommendations for Other Services       Precautions / Restrictions Precautions Precautions: Fall Restrictions Weight Bearing Restrictions: No    Mobility  Bed Mobility Overal bed mobility: Needs Assistance             General bed mobility comments: OOB in recliner  Transfers Overall transfer level: Needs assistance Equipment used: Rolling walker (2 wheeled) Transfers: Sit to/from Stand Sit to Stand: Min assist         General transfer comment: Min assist for power up from recliner and steadying upon standing. No VC required for safe hand placement when rising.   Ambulation/Gait Ambulation/Gait assistance: Min guard Gait Distance (Feet): 140 Feet(2x70 ft ) Assistive device: Rolling walker (2 wheeled) Gait Pattern/deviations: Step-through pattern;Decreased stride length Gait velocity: decr    General Gait  Details: Min guard for safety. pt with dyspnea 3/4 after 70 ft ambulation, requiring standing rest break. Pt with slow gait speed. HR and SpO2 113 and 98% respectively after ambulation.    Stairs             Wheelchair Mobility    Modified Rankin (Stroke Patients Only)       Balance Overall balance assessment: Needs assistance   Sitting balance-Leahy Scale: Good     Standing balance support: During functional activity Standing balance-Leahy Scale: Fair Standing balance comment: able to stand without RW, but requires RW for ambulation                             Cognition Arousal/Alertness: Awake/alert Behavior During Therapy: WFL for tasks assessed/performed Overall Cognitive Status: Within Functional Limits for tasks assessed                                        Exercises General Exercises - Lower Extremity Long Arc Quad: AROM;Both;10 reps;Seated Straight Leg Raises: AROM;AAROM;Both;10 reps;Seated Hip Flexion/Marching: AROM;Both;10 reps;Seated    General Comments General comments (skin integrity, edema, etc.): Pt's youngest daughter at bedside during session, very supportive       Pertinent Vitals/Pain Pain Assessment: No/denies pain    Home Living                      Prior Function            PT Goals (current goals can  now be found in the care plan section) Acute Rehab PT Goals Patient Stated Goal: to return to independent PT Goal Formulation: With patient/family Time For Goal Achievement: 10/15/18 Potential to Achieve Goals: Good Progress towards PT goals: Progressing toward goals    Frequency    Min 3X/week      PT Plan Current plan remains appropriate;Equipment recommendations need to be updated    Co-evaluation              AM-PAC PT "6 Clicks" Mobility   Outcome Measure  Help needed turning from your back to your side while in a flat bed without using bedrails?: None Help needed moving  from lying on your back to sitting on the side of a flat bed without using bedrails?: A Little Help needed moving to and from a bed to a chair (including a wheelchair)?: A Little Help needed standing up from a chair using your arms (e.g., wheelchair or bedside chair)?: A Little Help needed to walk in hospital room?: A Little Help needed climbing 3-5 steps with a railing? : A Little 6 Click Score: 19    End of Session Equipment Utilized During Treatment: Gait belt Activity Tolerance: Patient limited by fatigue Patient left: with call bell/phone within reach;in chair;with family/visitor present Nurse Communication: Mobility status PT Visit Diagnosis: Other abnormalities of gait and mobility (R26.89);Muscle weakness (generalized) (M62.81)     Time: 1202-1220 PT Time Calculation (min) (ACUTE ONLY): 18 min  Charges:  $Gait Training: 8-22 mins                     Nicola Police, PT Acute Rehabilitation Services Pager [phone]  Office [phone]   Allin Frix D Despina Hidden 10/03/2018, 1:35 PM

## 2018-10-03 NOTE — Progress Notes (Signed)
Subjective: Interval History: has no complaint .  Objective: Vital signs in last 24 hours: Temp:  [98.1 F (36.7 C)-98.4 F (36.9 C)] 98.1 F (36.7 C) (02/26 0635) Pulse Rate:  [86-109] 103 (02/26 0635) Resp:  [16-34] 16 (02/26 0635) BP: (97-131)/(43-77) 115/50 (02/26 0635) SpO2:  [92 %-99 %] 98 % (02/26 0635) Weight change:   Intake/Output from previous day: 02/25 0701 - 02/26 0700 In: 1005 [P.O.:865; I.V.:140] Out: 600 [Urine:600] Intake/Output this shift: No intake/output data recorded.  General appearance: alert, cooperative and no distress Resp: rales bibasilar Cardio: S1, S2 normal and systolic murmur: systolic ejection 2/6, crescendo and decrescendo at 2nd left intercostal space GI: soft, non-tender; bowel sounds normal; no masses,  no organomegaly Extremities: edema 1+  Lab Results: Recent Labs    10/02/18 0805 10/03/18 0629  WBC 2.5* 3.2*  HGB 13.2 14.4  HCT 40.1 42.9  PLT 297 336   BMET:  Recent Labs    10/02/18 0805 10/02/18 1921 10/03/18 0629  NA 127* 125* 126*  K 3.4*  --  4.0  CL 88*  --  82*  CO2 29  --  31  GLUCOSE 91  --  107*  BUN 6*  --  6*  CREATININE 0.49  --  0.54  CALCIUM 8.3*  --  9.1   No results for input(s): PTH in the last 72 hours. Iron Studies: No results for input(s): IRON, TIBC, TRANSFERRIN, FERRITIN in the last 72 hours.  Studies/Results: Dg Esophagus W Single Cm (sol Or Thin Ba)  Result Date: 10/01/2018 CLINICAL DATA:  Dysphagia. EXAM: ESOPHOGRAM/BARIUM SWALLOW TECHNIQUE: Single contrast examination was performed using thin barium or water soluble. FLUOROSCOPY TIME:  Fluoroscopy Time:  1 minutes 36 seconds Radiation Exposure Index (if provided by the fluoroscopic device): 16.5 Number of Acquired Spot Images: 0 COMPARISON:  None. FINDINGS: The study is limited due to patient condition. The patient could not fully stand and could not drink quickly. There is altered peristalsis with slow transit of the barium through the  esophagus. There is smooth narrowing of the distal esophagus throughout the study is which never completely opens up. No obvious mass. Limited views in the mucosa are unremarkable. Limited views of the stomach are normal. The patient could not tolerate swelling and tablet. IMPRESSION: 1. There is smooth narrowing of the distal esophagus which never opens up. The findings are suspicious or a benign stricture/narrowing, resulting in the patient's symptoms. Peristalsis is considered less likely as the finding is seen on multiple occasions and the distal esophagus did not open up despite multiple attempts. However, the patient could not tolerate a tablet for confirmation of a distal stricture. Electronically Signed   By: Gerome Sam III M.D   On: 10/01/2018 14:45    I have reviewed the patient's current medications.  Assessment/Plan: 1 Hyponatremia assymtomatic. Will cont Lasix, add demeclo.  Mental status at baseline and no sx so do not feel need to raise rapidly, give trial demeclo.  This is not a contraindication to endoscopy 2 interstitial lung disease 3 Esophageal dysfunction P trial Demeclocycline with Lasix    LOS: 4 days   Fayrene Fearing Mavis Gravelle 10/03/2018,10:49 AM

## 2018-10-03 NOTE — Progress Notes (Signed)
[facility] Progress Note  Sabrina Douglas 78 y.o. May 01, 1941  CC: Dysphagia, abnormal barium swallow   Subjective: Patient seen and examined at bedside.EGD was canceled this morning because of hyponatremia per anesthesia recommendations.  No other acute changes today.  Multiple family members at bedside.  All questions answered.     Objective: Vital signs in last 24 hours: Vitals:   10/02/18 2208 10/03/18 0635  BP: 103/66 (!) 115/50  Pulse: 98 (!) 103  Resp: 16 16  Temp: 98.2 F (36.8 C) 98.1 F (36.7 C)  SpO2: 96% 98%    Physical Exam:  General.  Alert/oriented x3.  Not in acute distress ABD :  Soft, nontender, nondistended, bowel sounds present.  Lab Results: Recent Labs    10/02/18 0805 10/02/18 1921 10/03/18 0629  NA 127* 125* 126*  K 3.4*  --  4.0  CL 88*  --  82*  CO2 29  --  31  GLUCOSE 91  --  107*  BUN 6*  --  6*  CREATININE 0.49  --  0.54  CALCIUM 8.3*  --  9.1  PHOS  --   --  2.6   Recent Labs    10/01/18 0404 10/03/18 0629  AST 57* 72*  ALT 31 32  ALKPHOS 62 65  BILITOT 0.4 0.6  PROT 6.6 7.4  ALBUMIN 3.1* 3.6  3.6   Recent Labs    10/02/18 0805 10/03/18 0629  WBC 2.5* 3.2*  HGB 13.2 14.4  HCT 40.1 42.9  MCV 93.0 92.1  PLT 297 336   No results for input(s): LABPROT, INR in the last 72 hours.    Assessment/Plan: -Oropharyngeal dysphagia -Abnormal barium swallow -Family history of esophageal and colon cancer. -Mild elevated AST.  Could be transient.  Recommend outpatient follow-up. -Severe pulmonary hypertension.  Recommendations -------------------------- - EGD was canceled this morning because of hyponatremia per anesthesia recommendations.  -She is also undergoing further work-up for severe pulmonary hypertension.  Pulmonary hypertension is thought to be  from her underlying  lung disease.  -Discussed with patient and patient's family.  We will start soft diet.  -Preferable to do EGD once sodium is more  than 130   - GI will follow.      Kathi Der MD, FACP 10/03/2018, 9:33 AM  Contact #  [phone]

## 2018-10-03 NOTE — Progress Notes (Signed)
Per Dr. Chaney Malling, EGD to be cancelled for today due to hyponatremia (Na 126).  Dr. Levora Angel aware.

## 2018-10-03 NOTE — Telephone Encounter (Signed)
Attempted to call patient in regards to Pulmonary Rehab - LM on VM °Gloria W. Support Rep II °

## 2018-10-03 NOTE — Care Management Important Message (Signed)
Important Message  Patient Details  Name: TINIYA BENKE MRN: [id number] Date of Birth: 1940-11-01   Medicare Important Message Given:  Yes    Caren Macadam 10/03/2018, 11:28 AMImportant Message  Patient Details  Name: DELAYSIA PAUP MRN: [id number] Date of Birth: 06-02-1941   Medicare Important Message Given:  Yes    Caren Macadam 10/03/2018, 11:28 AM

## 2018-10-04 ENCOUNTER — Inpatient Hospital Stay (HOSPITAL_COMMUNITY): Payer: Medicare Other | Admitting: Anesthesiology

## 2018-10-04 ENCOUNTER — Encounter (HOSPITAL_COMMUNITY)
Admission: EM | Disposition: A | Discharge status: Home Health | Payer: Self-pay | Source: Home / Self Care | Attending: Student

## 2018-10-04 ENCOUNTER — Ambulatory Visit (HOSPITAL_COMMUNITY): Payer: Medicare Other

## 2018-10-04 ENCOUNTER — Ambulatory Visit: Payer: Medicare Other | Admitting: Pulmonary Disease

## 2018-10-04 ENCOUNTER — Encounter (HOSPITAL_COMMUNITY): Payer: Self-pay | Admitting: *Deleted

## 2018-10-04 DIAGNOSIS — R0602 Shortness of breath: Secondary | ICD-10-CM

## 2018-10-04 DIAGNOSIS — I272 Pulmonary hypertension, unspecified: Secondary | ICD-10-CM

## 2018-10-04 DIAGNOSIS — R600 Localized edema: Secondary | ICD-10-CM

## 2018-10-04 HISTORY — PX: BALLOON DILATION: SHX5330

## 2018-10-04 HISTORY — PX: BIOPSY: SHX5522

## 2018-10-04 HISTORY — PX: ESOPHAGOGASTRODUODENOSCOPY (EGD) WITH PROPOFOL: SHX5813

## 2018-10-04 LAB — PULMONARY FUNCTION TEST
DL/VA % pred: 51 %
DL/VA: 2.08 ml/min/mmHg/L
DLCO cor % pred: 16 %
DLCO cor: 3.25 ml/min/mmHg
DLCO unc % pred: 17 %
DLCO unc: 3.35 ml/min/mmHg
FEF 25-75 Post: 1.66 L/sec
FEF 25-75 Pre: 1.79 L/sec
FEF2575-%Change-Post: -6 %
FEF2575-%Pred-Post: 116 %
FEF2575-%Pred-Pre: 125 %
FEV1-%Change-Post: -6 %
FEV1-%Pred-Post: 62 %
FEV1-%Pred-Pre: 66 %
FEV1-Post: 1.05 L
FEV1-Pre: 1.13 L
FEV1FVC-%Change-Post: 0 %
FEV1FVC-%Pred-Pre: 121 %
FEV6-%Change-Post: -9 %
FEV6-%Pred-Post: 53 %
FEV6-%Pred-Pre: 58 %
FEV6-Post: 1.11 L
FEV6-Pre: 1.23 L
FEV6FVC-%Pred-Post: 104 %
FEV6FVC-%Pred-Pre: 104 %
FVC-%Change-Post: -6 %
FVC-%Pred-Post: 52 %
FVC-%Pred-Pre: 56 %
FVC-Post: 1.14 L
FVC-Pre: 1.23 L
Post FEV1/FVC ratio: 92 %
Post FEV6/FVC ratio: 100 %
Pre FEV1/FVC ratio: 92 %
Pre FEV6/FVC Ratio: 100 %
RV % pred: 51 %
RV: 1.24 L
TLC % pred: 46 %
TLC: 2.42 L

## 2018-10-04 LAB — BASIC METABOLIC PANEL
Anion gap: 12 (ref 5–15)
BUN: 11 mg/dL (ref 8–23)
CO2: 32 mmol/L (ref 22–32)
Calcium: 9 mg/dL (ref 8.9–10.3)
Chloride: 83 mmol/L — ABNORMAL LOW (ref 98–111)
Creatinine, Ser: 0.66 mg/dL (ref 0.44–1.00)
GFR calc Af Amer: >60 mL/min (ref >60)
GFR calc non Af Amer: >60 mL/min (ref >60)
Glucose, Bld: 119 mg/dL — ABNORMAL HIGH (ref 70–99)
Potassium: 3.6 mmol/L (ref 3.5–5.1)
Sodium: 127 mmol/L — ABNORMAL LOW (ref 135–145)

## 2018-10-04 LAB — SODIUM: Sodium: 127 mmol/L — ABNORMAL LOW (ref 135–145)

## 2018-10-04 SURGERY — ESOPHAGOGASTRODUODENOSCOPY (EGD) WITH PROPOFOL
Anesthesia: Monitor Anesthesia Care

## 2018-10-04 MED ORDER — PROPOFOL 500 MG/50ML IV EMUL
INTRAVENOUS | Status: DC | PRN
  Administered 2018-10-04: 75 ug/kg/min via INTRAVENOUS

## 2018-10-04 MED ORDER — LACTATED RINGERS IV SOLN
INTRAVENOUS | Status: DC
  Administered 2018-10-04: 09:00:00 via INTRAVENOUS

## 2018-10-04 MED ORDER — PROPOFOL 10 MG/ML IV BOLUS
INTRAVENOUS | Status: DC | PRN
  Administered 2018-10-04 (×2): 20 mg via INTRAVENOUS

## 2018-10-04 MED ORDER — PHENYLEPHRINE 40 MCG/ML (10ML) SYRINGE FOR IV PUSH (FOR BLOOD PRESSURE SUPPORT)
PREFILLED_SYRINGE | INTRAVENOUS | Status: DC | PRN
  Administered 2018-10-04 (×2): 40 ug via INTRAVENOUS
  Administered 2018-10-04: 80 ug via INTRAVENOUS
  Administered 2018-10-04: 40 ug via INTRAVENOUS

## 2018-10-04 MED ORDER — ALBUTEROL SULFATE (2.5 MG/3ML) 0.083% IN NEBU
2.5000 mg | INHALATION_SOLUTION | Freq: Once | RESPIRATORY_TRACT | Status: AC
  Administered 2018-10-04: 2.5 mg via RESPIRATORY_TRACT

## 2018-10-04 MED ORDER — PANTOPRAZOLE SODIUM 40 MG PO TBEC: 40.0000 mg | DELAYED_RELEASE_TABLET | Freq: Every day | ORAL | Status: DC

## 2018-10-04 MED ORDER — SODIUM CHLORIDE 0.9 % IV SOLN: INTRAVENOUS | Status: DC

## 2018-10-04 MED ORDER — NYSTATIN 100000 UNIT/ML MT SUSP
5.0000 mL | Freq: Four times a day (QID) | OROMUCOSAL | Status: DC
  Administered 2018-10-04 (×2): 500000 [IU] via ORAL
  Filled 2018-10-04 (×3): qty 5

## 2018-10-04 SURGICAL SUPPLY — 15 items

## 2018-10-04 NOTE — Brief Op Note (Signed)
09/29/2018 - 10/04/2018  9:37 AM  PATIENT:  Clarene Duke  78 y.o. 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  Scattered white speck concerning for possible Candida infection.  Biopsies taken.  Dilation up to 18 mm with the balloon performed.  Recommendations ------------------------- -Start soft diet and advance as tolerated.  Chew Food well and moist food before swallowing. -Protonix once a day for at least 2 months -Nystatin swish and swallow for 7 days. -GI will sign off.  Call us back if needed.  Kathi Der MD, FACP 10/04/2018, 9:38 AM  Contact #  [phone]

## 2018-10-04 NOTE — Progress Notes (Signed)
Patient has a MEW score of 3 and is in the yellow. MEW protocol has been initiated. I went in to assess the patient and the patient appears to be at baseline. MD and charge nurse notified. Will continue to monitor.

## 2018-10-04 NOTE — Anesthesia Postprocedure Evaluation (Signed)
Anesthesia Post Note  Patient: Sabrina Douglas  Procedure(s) Performed: ESOPHAGOGASTRODUODENOSCOPY (EGD) WITH PROPOFOL (N/A ) BALLOON DILATION (N/A ) BIOPSY     Patient location during evaluation: PACU Anesthesia Type: MAC Level of consciousness: awake and alert Pain management: pain level controlled Vital Signs Assessment: post-procedure vital signs reviewed and stable Respiratory status: spontaneous breathing, nonlabored ventilation, respiratory function stable and patient connected to nasal cannula oxygen Cardiovascular status: stable and blood pressure returned to baseline Postop Assessment: no apparent nausea or vomiting Anesthetic complications: no    Last Vitals:  Vitals:   10/04/18 0942 10/04/18 1135  BP: 133/67 (!) 123/58  Pulse: (!) 104 100  Resp: (!) 24 18  Temp: 36.7 C 36.6 C  SpO2: 98% 95%    Last Pain:  Vitals:   10/04/18 1135  TempSrc: Oral  PainSc:                  [name]

## 2018-10-04 NOTE — Progress Notes (Signed)
[facility] Progress Note    Assessment/ Plan:   1 Hyponatremia assymtomatic. Mental status at baseline and stable SNa. - Upon d/c continue Demeclocycline 150mg  PO BID for at least 2-3 weeks; possibility she may require indefinitely but will need f/u with [facility] in 2-4 weeks. - Continue Lasix 40mg  PO BID as well.  Signing off at this time. Please reconsult as needed.  2 interstitial lung disease 3 Esophageal dysfunction     Subjective:   Feeling better. Denies f/c/n/v. Had nausea early in the weekend but none since. S/P endoscopy this AM.   Objective:   BP 133/67   Pulse (!) 104   Temp 98 F (36.7 C) (Oral)   Resp (!) 24   Ht 5\' 5"  (1.651 m)   Wt 63.5 kg   SpO2 98%   BMI 23.31 kg/m   Intake/Output Summary (Last 24 hours) at 10/04/2018 1050 Last data filed at 10/04/2018 2353 Gross per 24 hour  Intake 1340 ml  Output 750 ml  Net 590 ml   Weight change:   Physical Exam: General appearance: A&Ox3, pleasant Resp: rales bibasilar, no accessory muscle use Cardio: S1, S2 normal  GI: SNDNT Extremities: edema 1+  Imaging: Nm Pulmonary Perf And Vent  Result Date: 10/03/2018 CLINICAL DATA:  Shortness of Breath EXAM: NUCLEAR MEDICINE VENTILATION - PERFUSION LUNG SCAN VIEWS: Anterior, posterior, left lateral, right lateral, RPO, LPO, RAO, LAO-ventilation and perfusion RADIOPHARMACEUTICALS:  30.6 mCi of Tc-67m DTPA aerosol inhalation and 4.4 mCi Tc84m MAA IV COMPARISON:  Chest radiograph September 29, 2018 FINDINGS: Ventilation: Radiotracer uptake is homogeneous and symmetric bilaterally. No ventilation defects are evident. Perfusion: Radiotracer uptake is homogeneous and symmetric bilaterally. No perfusion defects are evident. IMPRESSION: No appreciable ventilation or perfusion defects. Very low probability of pulmonary embolus. Electronically Signed   By: Bretta Bang III M.D.   On: 10/03/2018 14:01    Labs: BMET Recent Labs  Lab 09/29/18 1434  09/29/18 1823  09/30/18 0355  10/01/18 0404  10/01/18 2342 10/02/18 0319 10/02/18 0805 10/02/18 1921 10/03/18 0629 10/03/18 1922 10/04/18 0649  NA 114* 111*   < > 117*   < > 123*   < > 134* 128* 127* 125* 126* 127* 127*  K 4.7 4.1  --  4.1  --  4.2  --   --   --  3.4*  --  4.0  --  3.6  CL 78* 77*  --  84*  --  88*  --   --   --  88*  --  82*  --  83*  CO2 26 26  --  26  --  27  --   --   --  29  --  31  --  32  GLUCOSE 120* 125*  --  121*  --  101*  --   --   --  91  --  107*  --  119*  BUN 6* 5*  --  <5*  --  5*  --   --   --  6*  --  6*  --  11  CREATININE 0.43* 0.46  --  0.42*  --  0.45  --   --   --  0.49  --  0.54  --  0.66  CALCIUM 8.8* 8.2*  --  8.1*  --  8.4*  --   --   --  8.3*  --  9.1  --  9.0  PHOS  --   --   --   --   --   --   --   --   --   --   --  2.6  --   --    < > = values in this interval not displayed.   CBC Recent Labs  Lab 09/29/18 1434 09/30/18 0350 10/02/18 0805 10/03/18 0629  WBC 2.7* 3.3* 2.5* 3.2*  NEUTROABS 1.7  --   --   --   HGB 13.8 13.0 13.2 14.4  HCT 40.6 39.0 40.1 42.9  MCV 90.4 90.7 93.0 92.1  PLT 331 320 297 336    Medications:    . demeclocycline  150 mg Oral Q12H  . enoxaparin (LOVENOX) injection  40 mg Subcutaneous Daily  . feeding supplement  1 Container Oral TID BM  . furosemide  40 mg Oral BID  . metoprolol tartrate  25 mg Oral BID  . multivitamin with minerals  1 tablet Oral Daily  . nystatin  5 mL Oral QID  . pantoprazole  40 mg Oral Daily      Paulene Floor, MD 10/04/2018, 10:50 AM

## 2018-10-04 NOTE — Anesthesia Procedure Notes (Signed)
Procedure Name: MAC Date/Time: 10/04/2018 9:10 AM Performed by: Eben Burow, CRNA Pre-anesthesia Checklist: Patient identified, Emergency Drugs available, Suction available, Patient being monitored and Timeout performed Oxygen Delivery Method: Nasal cannula Dental Injury: Teeth and Oropharynx as per pre-operative assessment

## 2018-10-04 NOTE — Progress Notes (Addendum)
Progress Note  Patient Name: Sabrina Douglas Date of Encounter: 10/04/2018  Primary Cardiologist: No primary care provider on file.   Subjective   She feels significantly better with improved LE edema and SOB.  Inpatient Medications    Scheduled Meds: . demeclocycline  150 mg Oral Q12H  . enoxaparin (LOVENOX) injection  40 mg Subcutaneous Daily  . feeding supplement  1 Container Oral TID BM  . furosemide  40 mg Oral BID  . metoprolol tartrate  25 mg Oral BID  . multivitamin with minerals  1 tablet Oral Daily  . nystatin  5 mL Oral QID  . pantoprazole  40 mg Oral Daily   Continuous Infusions:  PRN Meds: acetaminophen **OR** acetaminophen, albuterol, ALPRAZolam, hydrALAZINE, ondansetron **OR** ondansetron (ZOFRAN) IV, polyethylene glycol   Vital Signs    Vitals:   10/04/18 0500 10/04/18 0838 10/04/18 0942 10/04/18 1135  BP:  (!) 147/45 133/67 (!) 123/58  Pulse:  (!) 103 (!) 104 100  Resp:  20 (!) 24 18  Temp:  98.3 F (36.8 C) 98 F (36.7 C) 97.9 F (36.6 C)  TempSrc:  Oral Oral Oral  SpO2:  94% 98% 95%  Weight: 63.5 kg 63.5 kg    Height: 5\' 5"  (1.651 m) 5\' 5"  (1.651 m)      Intake/Output Summary (Last 24 hours) at 10/04/2018 1406 Last data filed at 10/04/2018 0929 Gross per 24 hour  Intake 980 ml  Output 400 ml  Net 580 ml   Last 3 Weights 10/04/2018 10/04/2018 10/02/2018  Weight (lbs) 140 lb 1.6 oz 140 lb 1.6 oz 143 lb 4.8 oz  Weight (kg) 63.549 kg 63.549 kg 65 kg      Telemetry    SR to ST - Personally Reviewed  Physical Exam   GEN: No acute distress.   Neck: 4 cm JVD Cardiac: RRR, 3/6 systolic murmur, rubs, or gallops.  Respiratory: Clear to auscultation bilaterally. GI: Soft, nontender, non-distended  MS: No edema; No deformity. Neuro:  Nonfocal  Psych: Normal affect   Labs    Chemistry Recent Labs  Lab 09/29/18 1434  10/01/18 0404  10/02/18 0805  10/03/18 0629 10/03/18 1922 10/04/18 0649  NA 114*   < > 123*   < > 127*   < > 126* 127*  127*  K 4.7   < > 4.2  --  3.4*  --  4.0  --  3.6  CL 78*   < > 88*  --  88*  --  82*  --  83*  CO2 26   < > 27  --  29  --  31  --  32  GLUCOSE 120*   < > 101*  --  91  --  107*  --  119*  BUN 6*   < > 5*  --  6*  --  6*  --  11  CREATININE 0.43*   < > 0.45  --  0.49  --  0.54  --  0.66  CALCIUM 8.8*   < > 8.4*  --  8.3*  --  9.1  --  9.0  PROT 7.6  --  6.6  --   --   --  7.4  --   --   ALBUMIN 3.7  --  3.1*  --   --   --  3.6  3.6  --   --   AST 83*  --  57*  --   --   --  72*  --   --   ALT 41  --  31  --   --   --  32  --   --   ALKPHOS 73  --  62  --   --   --  65  --   --   BILITOT 0.6  --  0.4  --   --   --  0.6  --   --   GFRNONAA >60   < > >60  --  >60  --  >60  --  >60  GFRAA >60   < > >60  --  >60  --  >60  --  >60  ANIONGAP 10   < > 8  --  10  --  13  --  12   < > = values in this interval not displayed.     Hematology Recent Labs  Lab 09/30/18 0350 10/02/18 0805 10/03/18 0629  WBC 3.3* 2.5* 3.2*  RBC 4.30 4.31 4.66  HGB 13.0 13.2 14.4  HCT 39.0 40.1 42.9  MCV 90.7 93.0 92.1  MCH 30.2 30.6 30.9  MCHC 33.3 32.9 33.6  RDW 11.1* 11.5 11.7  PLT 320 297 336    Cardiac EnzymesNo results for input(s): TROPONINI in the last 168 hours.  Recent Labs  Lab 09/29/18 1439  TROPIPOC 0.02     BNPNo results for input(s): BNP, PROBNP in the last 168 hours.   DDimer No results for input(s): DDIMER in the last 168 hours.   Radiology    Nm Pulmonary Perf And Vent  Result Date: 10/03/2018 CLINICAL DATA:  Shortness of Breath EXAM: NUCLEAR MEDICINE VENTILATION - PERFUSION LUNG SCAN VIEWS: Anterior, posterior, left lateral, right lateral, RPO, LPO, RAO, LAO-ventilation and perfusion RADIOPHARMACEUTICALS:  30.6 mCi of Tc-374m DTPA aerosol inhalation and 4.4 mCi Tc2974m MAA IV COMPARISON:  Chest radiograph September 29, 2018 FINDINGS: Ventilation: Radiotracer uptake is homogeneous and symmetric bilaterally. No ventilation defects are evident. Perfusion: Radiotracer uptake is  homogeneous and symmetric bilaterally. No perfusion defects are evident. IMPRESSION: No appreciable ventilation or perfusion defects. Very low probability of pulmonary embolus. Electronically Signed   By: Bretta BangWilliam  Woodruff III M.D.   On: 10/03/2018 14:01    Cardiac Studies     Patient Profile     78 y.o. female with a hx of interstitial lung disease, HTN, chronic lower ext edemawho was admitted for the evaluation ofgeneralized weakness and difficulty swallowing after developing oral thrush on steroids, with significant weight loss recently, found to be hyponatremic of 114 on admission, initial Na level of 114. She was given NS in ER, then switched to 3% saline, her sodium has improved up to 134 now slightly down to 127. Nephrology is following.   The patient was fully independent and active till October 2019 when she was diagnosed with pneumonia with significant functional deterioration, after which she was diagnosed with interstitial lung disease and was slowly getting worse to the point that she is only able to do few steps before she gets short of breath.  Her physical exam shows mildly elevated JVDs, 3 out of 6 systolic murmur with prominent RV S3, no crackles over the lungs, minimal lower extremity edema. Her chest x-ray shows Unchanged bibasilar interstitial lung disease. No acute finding. I have reviewed her echocardiogram and it shows LVEF of 70%, more normal diastolic function, moderately dilated right ventricle with normal systolic function and elevated RVSP of 63 mmHg.  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 twice a day for at least 2 to 3 weeks, Lasix 40 mg twice a day, follow-up outpatient with [facility] in 2 to 4 weeks.  V/Q scan showed no appreciable ventilation or  perfusion defects. Very low probability of pulmonary embolus.  PFTs showed minimal obstruction, severe restriction and severe diffusion defect.   For questions or updates, please contact [facility] Please consult [email or web address] for contact info under     Signed, Tobias Alexander, MD  10/04/2018, 2:06 PM

## 2018-10-04 NOTE — Progress Notes (Signed)
[facility]                                                                              Patient Demographics  Sabrina Douglas, is a 78 y.o. female, DOB - 1940-08-31, ZOX:[id number]  Admit date - 09/29/2018   Admitting Physician Osvaldo Shipper, MD  Outpatient Primary MD for the patient is Renaye Rakers, MD  Outpatient specialists:   LOS - 5  days   Medical records reviewed and are as summarized below:    Chief Complaint  Patient presents with  . not eating or drinking       Brief summary   78 year old female with history of hypertension, chronic LE edema and interstitial lung disease admitted with hyponatremia to 114 without major symptoms, dysphagia and about 20 pounds weight loss in the months.  Recently started on Celexa.  Has not been taking spironolactone for 2 weeks.  Initially given IV normal saline bolus that made hyponatremia worse.    Nephrology was consulted.  Normal saline was stopped and started on 3% NaCl with improvement in hyponatremia.  In regards to interstitial lung disease and possible autoimmune process, no intervention inpatient per pulmonology, Dr. Marchelle Gearing but she could be a candidate for [facility] outpatient.  Patient's pulmonologist, Dr. Vassie Loll aware of patient's admission. Echocardiogram without significant structural or functional abnormality except for more bubble severe pulmonary hypertension.  Cardiology consulted for this. For dysphagia  SLP was consulted and barium swallow ordered and showed possible esophageal stricture.  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 twice a day for at least 2 to 3 weeks, Lasix 40 mg twice a day, follow-up outpatient with  [facility] in 2 to 4 weeks.  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  Dysphagia -SLP consulted, barium swallow showed possible esophageal stricture -EGD done today, esophageal stricture dilated, grade a reflux esophagitis, esophageal plaques suspicious for candidiasis, biopsied, normal duodenal bulb -Placed on soft diet, PPI, nystatin    Essential hypertension -Currently stable, continue beta-blocker, on Lasix  -Holding Aldactone  Generalized debility -PT recommended home health PT  Moderate protein calorie malnutrition -Continue nutritional supplements  Depression/anxiety -Currently stable, SSRIs held  Code Status: Full code DVT Prophylaxis:  Lovenox  Family Communication: Discussed in detail with the patient, all imaging results, lab results explained to the patient, 2 daughters at the bedside   Disposition Plan: Advance diet today, if no issues, hopefully DC home in a.m.  Time Spent in minutes 25 minutes  Procedures:  EGD 10/04/2018  Consultants:   GI Cardiology   Antimicrobials:   Anti-infectives (From admission, onward)   Start     Dose/Rate Route Frequency Ordered Stop   10/03/18 1200  demeclocycline (DECLOMYCIN) tablet 150 mg     150  mg Oral Every 12 hours 10/03/18 1048           Medications  Scheduled Meds: . demeclocycline  150 mg Oral Q12H  . enoxaparin (LOVENOX) injection  40 mg Subcutaneous Daily  . feeding supplement  1 Container Oral TID BM  . furosemide  40 mg Oral BID  . metoprolol tartrate  25 mg Oral BID  . multivitamin with minerals  1 tablet Oral Daily  . nystatin  5 mL Oral QID  . pantoprazole  40 mg Oral Daily   Continuous Infusions: PRN  Meds:.acetaminophen **OR** acetaminophen, albuterol, ALPRAZolam, hydrALAZINE, ondansetron **OR** ondansetron (ZOFRAN) IV, polyethylene glycol      Subjective:   Sabrina Douglas was seen and examined today.  Seen after endoscopy, no acute issues.  No nausea vomiting or abdominal pain.  Looking forward to the soft diet.  Patient denies dizziness, chest pain, shortness of breath, abdominal pain, N/V/D/C.  Objective:   Vitals:   10/04/18 0500 10/04/18 0838 10/04/18 0942 10/04/18 1135  BP:  (!) 147/45 133/67 (!) 123/58  Pulse:  (!) 103 (!) 104 100  Resp:  20 (!) 24 18  Temp:  98.3 F (36.8 C) 98 F (36.7 C) 97.9 F (36.6 C)  TempSrc:  Oral Oral Oral  SpO2:  94% 98% 95%  Weight: 63.5 kg 63.5 kg    Height: 5\' 5"  (1.651 m) 5\' 5"  (1.651 m)      Intake/Output Summary (Last 24 hours) at 10/04/2018 1356 Last data filed at 10/04/2018 0929 Gross per 24 hour  Intake 980 ml  Output 400 ml  Net 580 ml     Wt Readings from Last 3 Encounters:  10/04/18 63.5 kg  09/18/18 68.3 kg  08/28/18 73 kg    Physical Exam  General: Alert and oriented x 3, NAD  Eyes:   HEENT:    Cardiovascular: S1 S2 clear, RRR. No pedal edema b/l  Respiratory: CTAB, no wheezing, rales or rhonchi  Gastrointestinal: Soft, nontender, nondistended, NBS  Ext: no pedal edema bilaterally  Neuro: no new deficits  Musculoskeletal: No cyanosis, clubbing  Skin: No rashes  Psych: Normal affect and demeanor, alert and oriented x3         Data Reviewed:  I have personally reviewed following labs and imaging studies  Micro Results Recent Results (from the past 240 hour(s))  MRSA PCR Screening     Status: None   Collection Time: 09/29/18  5:46 PM  Result Value Ref Range Status   MRSA by PCR NEGATIVE NEGATIVE Final    Comment:        The GeneXpert MRSA Assay (FDA approved for NASAL specimens only), is one component of a comprehensive MRSA colonization surveillance program. It is not intended to  diagnose MRSA infection nor to guide or monitor treatment for MRSA infections. Performed at [facility], [address] [place], [place] [address]     Radiology Reports Dg Chest 2 View  Result Date: 09/29/2018 CLINICAL DATA:  Shortness of breath. EXAM: CHEST - 2 VIEW COMPARISON:  Chest x-ray dated September 04, 2018. FINDINGS: The heart size and mediastinal contours are within normal limits. Normal pulmonary vascularity. Unchanged interstitial opacities at the left greater than right lung bases. No focal consolidation, pleural effusion, or pneumothorax. No acute osseous abnormality. Distended stomach. IMPRESSION: 1. Unchanged bibasilar interstitial lung disease.  No acute finding. Electronically Signed   By: Obie DredgeWilliam T Derry M.D.   On: 09/29/2018 14:46   Ct Chest High Resolution  Result  Date: 09/10/2018 CLINICAL DATA:  78 year old female with history of unresolved pneumonia. Follow-up study. EXAM: CT CHEST WITHOUT CONTRAST TECHNIQUE: Multidetector CT imaging of the chest was performed following the standard protocol without intravenous contrast. High resolution imaging of the lungs, as well as inspiratory and expiratory imaging, was performed. COMPARISON:  Chest CT 09/04/2018. FINDINGS: Cardiovascular: Heart size is normal. There is no significant pericardial fluid, thickening or pericardial calcification. Aortic atherosclerosis. No definite coronary artery calcifications. Mediastinum/Nodes: No pathologically enlarged mediastinal or hilar lymph nodes. Please note that accurate exclusion of hilar adenopathy is limited on noncontrast CT scans. Esophagus is unremarkable in appearance. No axillary lymphadenopathy. Lungs/Pleura: High-resolution images demonstrate patchy areas of ground-glass attenuation, septal thickening, severe thickening of the peribronchovascular interstitium, cylindrical bronchiectasis and peripheral bronchiolectasis in the lungs bilaterally, most evident in the lower  lobes. These findings have a definitive craniocaudal gradient. No honeycombing is identified. Inspiratory and expiratory imaging is unremarkable. No acute consolidative airspace disease. No pleural effusions. No definite suspicious appearing pulmonary nodules or masses are noted. Upper Abdomen: 1.5 cm exophytic low-attenuation lesion in the upper pole of the left kidney, incompletely characterized on today's noncontrast CT examination, but similar to the prior study and statistically likely a cyst. Musculoskeletal: There are no aggressive appearing lytic or blastic lesions noted in the visualized portions of the skeleton. IMPRESSION: 1. 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 Repeat high-resolution chest CT is recommended in 12 months to assess for temporal changes in the appearance of the lung parenchyma. 2. Aortic atherosclerosis. Aortic Atherosclerosis (ICD10-I70.0). Electronically Signed   By: Trudie Reed M.D.   On: 09/10/2018 08:17   Nm Pulmonary Perf And Vent  Result Date: 10/03/2018 CLINICAL DATA:  Shortness of Breath EXAM: NUCLEAR MEDICINE VENTILATION - PERFUSION LUNG SCAN VIEWS: Anterior, posterior, left lateral, right lateral, RPO, LPO, RAO, LAO-ventilation and perfusion RADIOPHARMACEUTICALS:  30.6 mCi of Tc-49m DTPA aerosol inhalation and 4.4 mCi Tc83m MAA IV COMPARISON:  Chest radiograph September 29, 2018 FINDINGS: Ventilation: Radiotracer uptake is homogeneous and symmetric bilaterally. No ventilation defects are evident. Perfusion: Radiotracer uptake is homogeneous and symmetric bilaterally. No perfusion defects are evident. IMPRESSION: No appreciable ventilation or perfusion defects. Very low probability of pulmonary embolus. Electronically Signed   By: Bretta Bang III M.D.   On: 10/03/2018 14:01   Dg Esophagus W Single Cm (sol Or Thin Ba)  Result Date: 10/01/2018 CLINICAL  DATA:  Dysphagia. EXAM: ESOPHOGRAM/BARIUM SWALLOW TECHNIQUE: Single contrast examination was performed using thin barium or water soluble. FLUOROSCOPY TIME:  Fluoroscopy Time:  1 minutes 36 seconds Radiation Exposure Index (if provided by the fluoroscopic device): 16.5 Number of Acquired Spot Images: 0 COMPARISON:  None. FINDINGS: The study is limited due to patient condition. The patient could not fully stand and could not drink quickly. There is altered peristalsis with slow transit of the barium through the esophagus. There is smooth narrowing of the distal esophagus throughout the study is which never completely opens up. No obvious mass. Limited views in the mucosa are unremarkable. Limited views of the stomach are normal. The patient could not tolerate swelling and tablet. IMPRESSION: 1. There is smooth narrowing of the distal esophagus which never opens up. The findings are suspicious or a benign stricture/narrowing, resulting in the patient's symptoms. Peristalsis is considered less likely as the finding is seen on multiple occasions and the distal esophagus did not open up despite multiple attempts. However, the patient  could not tolerate a tablet for confirmation of a distal stricture. Electronically Signed   By: Gerome Sam III M.D   On: 10/01/2018 14:45    Lab Data:  CBC: Recent Labs  Lab 09/29/18 1434 09/30/18 0350 10/02/18 0805 10/03/18 0629  WBC 2.7* 3.3* 2.5* 3.2*  NEUTROABS 1.7  --   --   --   HGB 13.8 13.0 13.2 14.4  HCT 40.6 39.0 40.1 42.9  MCV 90.4 90.7 93.0 92.1  PLT 331 320 297 336   Basic Metabolic Panel: Recent Labs  Lab 09/30/18 0348 09/30/18 0355  10/01/18 0404  10/02/18 0805 10/02/18 1921 10/03/18 0629 10/03/18 1922 10/04/18 0649  NA  --  117*   < > 123*   < > 127* 125* 126* 127* 127*  K  --  4.1  --  4.2  --  3.4*  --  4.0  --  3.6  CL  --  84*  --  88*  --  88*  --  82*  --  83*  CO2  --  26  --  27  --  29  --  31  --  32  GLUCOSE  --  121*  --   101*  --  91  --  107*  --  119*  BUN  --  <5*  --  5*  --  6*  --  6*  --  11  CREATININE  --  0.42*  --  0.45  --  0.49  --  0.54  --  0.66  CALCIUM  --  8.1*  --  8.4*  --  8.3*  --  9.1  --  9.0  MG 1.6*  --   --   --   --   --   --   --   --   --   PHOS  --   --   --   --   --   --   --  2.6  --   --    < > = values in this interval not displayed.   GFR: Estimated Creatinine Clearance: 51.3 mL/min (by C-G formula based on SCr of 0.66 mg/dL). Liver Function Tests: Recent Labs  Lab 09/29/18 1434 10/01/18 0404 10/03/18 0629  AST 83* 57* 72*  ALT 41 31 32  ALKPHOS 73 62 65  BILITOT 0.6 0.4 0.6  PROT 7.6 6.6 7.4  ALBUMIN 3.7 3.1* 3.6  3.6   No results for input(s): LIPASE, AMYLASE in the last 168 hours. No results for input(s): AMMONIA in the last 168 hours. Coagulation Profile: No results for input(s): INR, PROTIME in the last 168 hours. Cardiac Enzymes: Recent Labs  Lab 10/01/18 0404  CKTOTAL 381*  CKMB 133.0*   BNP (last 3 results) No results for input(s): PROBNP in the last 8760 hours. HbA1C: No results for input(s): HGBA1C in the last 72 hours. CBG: No results for input(s): GLUCAP in the last 168 hours. Lipid Profile: No results for input(s): CHOL, HDL, LDLCALC, TRIG, CHOLHDL, LDLDIRECT in the last 72 hours. Thyroid Function Tests: No results for input(s): TSH, T4TOTAL, FREET4, T3FREE, THYROIDAB in the last 72 hours. Anemia Panel: No results for input(s): VITAMINB12, FOLATE, FERRITIN, TIBC, IRON, RETICCTPCT in the last 72 hours. Urine analysis:    Component Value Date/Time   COLORURINE YELLOW 09/29/2018 1434   APPEARANCEUR CLEAR 09/29/2018 1434   LABSPEC 1.017 09/29/2018 1434   PHURINE 7.0 09/29/2018 1434   GLUCOSEU 50 (A) 09/29/2018 1434  HGBUR NEGATIVE 09/29/2018 1434   BILIRUBINUR NEGATIVE 09/29/2018 1434   KETONESUR 20 (A) 09/29/2018 1434   PROTEINUR NEGATIVE 09/29/2018 1434   NITRITE NEGATIVE 09/29/2018 1434   LEUKOCYTESUR TRACE (A) 09/29/2018  1434       M.D. [facility] 10/04/2018, 1:56 PM  Pager: [phone] Between 7am to 7pm - call Pager - [phone]  After 7pm go to [email or web address] - password [email or web address]  Call night coverage person covering after 7pm

## 2018-10-04 NOTE — Progress Notes (Signed)
Patient at this time has no scheduled IV medications at this time. Patient has had 4 PIVs and has pulled all 4 out. At this time no PIV will be placed unless the patient truly needs one.

## 2018-10-04 NOTE — Progress Notes (Signed)
[facility] Progress Note  Sabrina Douglas 78 y.o. Dec 06, 1940  CC: Dysphagia, abnormal barium swallow   Subjective: No acute events overnight.  Tolerated soft diet yesterday.  Denies any worsening of shortness of breath.  VQ scan negative yesterday.     Objective: Vital signs in last 24 hours: Vitals:   10/03/18 2234 10/04/18 0838  BP: 129/72 (!) 147/45  Pulse: 81 (!) 103  Resp: 20 20  Temp: 97.8 F (36.6 C) 98.3 F (36.8 C)  SpO2: 92% 94%    Physical Exam:  General.  Alert/oriented x3.  Not in acute distress ABD :  Soft, nontender, nondistended, bowel sounds present.  Lab Results: Recent Labs    10/03/18 0629 10/03/18 1922 10/04/18 0649  NA 126* 127* 127*  K 4.0  --  3.6  CL 82*  --  83*  CO2 31  --  32  GLUCOSE 107*  --  119*  BUN 6*  --  11  CREATININE 0.54  --  0.66  CALCIUM 9.1  --  9.0  PHOS 2.6  --   --    Recent Labs    10/03/18 0629  AST 72*  ALT 32  ALKPHOS 65  BILITOT 0.6  PROT 7.4  ALBUMIN 3.6  3.6   Recent Labs    10/02/18 0805 10/03/18 0629  WBC 2.5* 3.2*  HGB 13.2 14.4  HCT 40.1 42.9  MCV 93.0 92.1  PLT 297 336   No results for input(s): LABPROT, INR in the last 72 hours.    Assessment/Plan: -Oropharyngeal dysphagia -Abnormal barium swallow -Family history of esophageal and colon cancer. -Mild elevated AST.  Could be transient.  Recommend outpatient follow-up. -Severe pulmonary hypertension.  Negative VQ scan.  Recommendations -------------------------- -EGD today with dilation. -She was advised to chew her food well and moist of food before swallowing. -Case was discussed with admitting attending Dr. Isidoro Donning yesterday.  Risks (bleeding, infection, bowel perforation that could require surgery, sedation-related changes in cardiopulmonary systems), benefits (identification and possible treatment of source of symptoms, exclusion of certain causes of symptoms), and alternatives (watchful waiting, radiographic  imaging studies, empiric medical treatment)  were explained to patient/family in detail and patient wishes to proceed.     Kathi Der MD, FACP 10/04/2018, 8:43 AM  Contact #  [phone]

## 2018-10-04 NOTE — Progress Notes (Signed)
Physical Therapy Treatment Patient Details Name: Sabrina Douglas MRN: [id number] DOB: Apr 19, 1941 Today's Date: 10/04/2018    History of Present Illness  Sabrina Douglas is a 78 y.o. female with a past medical history of essential hypertension, chronic lower extremity edema who is under evaluation by pulmonology for interstitial lung disease diagnosed recently. Admitted with weakness, difficulty swallowing, 20# weight loss and severe hyponatremia with a sodium level of 114.     PT Comments    Pt with proficient performance of LE exercises. Pt with R shoulder pain today, and PT practiced shoulder exercises with pt to address RTC weakness and pain. PT recommending OPPT to address shoulder issues, pt and family agree to go OPPT route when pt is home and returned to PLOF.    Follow Up Recommendations  Home health PT;Supervision - Intermittent;Other (comment)(HH aide )     Equipment Recommendations  None recommended by PT(Pt's daughter picked up pt's rollator )    Recommendations for Other Services       Precautions / Restrictions Precautions Precautions: Fall Restrictions Weight Bearing Restrictions: No    Mobility  Bed Mobility               General bed mobility comments: OOB in recliner; no further mobility completed because pt reports going to lung test shortly and wants to avoid dyspnea if possible. Agreeable to chair level UE and LE exercises.  Transfers                    Ambulation/Gait                 Stairs             Wheelchair Mobility    Modified Rankin (Stroke Patients Only)       Balance Overall balance assessment: Needs assistance   Sitting balance-Leahy Scale: Good     Standing balance support: During functional activity Standing balance-Leahy Scale: Fair Standing balance comment: able to stand without RW, but requires RW for ambulation                             Cognition Arousal/Alertness:  Awake/alert Behavior During Therapy: WFL for tasks assessed/performed Overall Cognitive Status: Within Functional Limits for tasks assessed                                        Exercises General Exercises - Lower Extremity Long Arc Quad: AROM;Both;Seated;15 reps Hip ABduction/ADduction: AROM;Both;15 reps;Seated Straight Leg Raises: AROM;AAROM;Both;10 reps;Seated Hip Flexion/Marching: AROM;Both;Seated;20 reps Shoulder Exercises Shoulder Flexion: AAROM;Right;10 reps;Seated Shoulder External Rotation: AROM;Right;10 reps;Seated(isometric, elbow at side, resisting against PT) Other Exercises Other Exercises: shoulder IR isometrics, elbow flexed against side, resisting against PT. x10    General Comments General comments (skin integrity, edema, etc.): 2 of pt's daughters present for session, concerned about pt's R shoulder. Pt with impingement-like symptoms, including shoulder pain >80* shoulder abduction, rotator cuff insertion TTP and weakness when shoulder ER tested isometrically. PT suggested after d/c from hospital that pt follow up in OP setting.       Pertinent Vitals/Pain Pain Assessment: No/denies pain    Home Living                      Prior Function            PT  Goals (current goals can now be found in the care plan section) Acute Rehab PT Goals Patient Stated Goal: to return to independent PT Goal Formulation: With patient/family Time For Goal Achievement: 10/15/18 Potential to Achieve Goals: Good Progress towards PT goals: Progressing toward goals    Frequency    Min 3X/week      PT Plan Current plan remains appropriate    Co-evaluation              AM-PAC PT "6 Clicks" Mobility   Outcome Measure  Help needed turning from your back to your side while in a flat bed without using bedrails?: None Help needed moving from lying on your back to sitting on the side of a flat bed without using bedrails?: A Little Help needed  moving to and from a bed to a chair (including a wheelchair)?: A Little Help needed standing up from a chair using your arms (e.g., wheelchair or bedside chair)?: A Little Help needed to walk in hospital room?: A Little Help needed climbing 3-5 steps with a railing? : A Little 6 Click Score: 19    End of Session   Activity Tolerance: Patient limited by fatigue Patient left: with call bell/phone within reach;in chair;with family/visitor present Nurse Communication: Mobility status PT Visit Diagnosis: Other abnormalities of gait and mobility (R26.89);Muscle weakness (generalized) (M62.81)     Time: 6283-1517 PT Time Calculation (min) (ACUTE ONLY): 17 min  Charges:  $Therapeutic Exercise: 8-22 mins                     Lynnie Koehler Terrial Rhodes, PT Acute Rehabilitation Services Pager [phone]  Office [phone]   Ardath Lepak D Despina Hidden 10/04/2018, 2:56 PM

## 2018-10-04 NOTE — Progress Notes (Signed)
OT Cancellation Note  Patient Details Name: Sabrina Douglas MRN: [id number] DOB: Dec 19, 1940   Cancelled Treatment:    Reason Eval/Treat Not Completed: Patient at procedure or test/ unavailable. Pt at EGD  [facility] 10/04/2018, 8:39 AM  Marica Otter, OTR/L Acute Rehabilitation Services [phone] [facility] pager [phone] office 10/04/2018

## 2018-10-04 NOTE — Op Note (Signed)
[facility] Patient Name: Sabrina Douglas Procedure Date: 10/04/2018 MRN: [id number] Attending MD: Kathi Der , MD Date of Birth: 09-17-1940 CSN: [id number] Age: 78 Admit Type: Inpatient Procedure:                Upper GI endoscopy Indications:              Oropharyngeal phase dysphagia, Abnormal UGI series Providers:                Kathi Der, MD, Berdine Dance, Technician Referring MD:              Medicines:                Sedation Administered by an Anesthesia Professional Complications:            No immediate complications. Estimated Blood Loss:     Estimated blood loss was minimal. Procedure:                Pre-Anesthesia Assessment:                           - Prior to the procedure, a History and Physical                            was performed, and patient medications and                            allergies were reviewed. The patient's tolerance of                            previous anesthesia was also reviewed. The risks                            and benefits of the procedure and the sedation                            options and risks were discussed with the patient.                            All questions were answered, and informed consent                            was obtained. Prior Anticoagulants: The patient has                            taken no previous anticoagulant or antiplatelet                            agents. ASA Grade Assessment: III - A patient with                            severe systemic disease. After reviewing the risks  and benefits, the patient was deemed in                            satisfactory condition to undergo the procedure.                           After obtaining informed consent, the endoscope was                            passed under direct vision. Throughout the                            procedure, the patient's blood pressure,  pulse, and                            oxygen saturations were monitored continuously. The                            [id number]-H190 ([id number] Olympus gastroscope was                            introduced through the mouth, and advanced to the                            second part of duodenum. The upper GI endoscopy was                            accomplished without difficulty. The patient                            tolerated the procedure well. Scope In: Scope Out: Findings:      LA Grade A (one or more mucosal breaks less than 5 mm, not extending       between tops of 2 mucosal folds) esophagitis was found at the       gastroesophageal junction.      One benign-appearing, intrinsic moderate (circumferential scarring or       stenosis; an endoscope may pass) stenosis was found at the       gastroesophageal junction. The stenosis was traversed. A TTS dilator was       passed through the scope. Dilation with a 15-16.5-18 mm balloon dilator       was performed to 18 mm. The dilation site was examined following       endoscope reinsertion and showed no bleeding, mucosal tear or       perforation.      Patchy, white plaques were found in the mid esophagus and in the distal       esophagus. Biopsies were taken with a cold forceps for histology.      Normal mucosa was found in the entire examined stomach. Biopsies were       taken with a cold forceps for histology.      The cardia and gastric fundus were normal on retroflexion.      The duodenal bulb, first portion of the duodenum and second portion of       the duodenum were normal. Impression:               -  LA Grade A reflux esophagitis.                           - Benign-appearing esophageal stenosis. Dilated.                           - Esophageal plaques were found, suspicious for                            candidiasis. Biopsied.                           - Normal mucosa was found in the entire stomach.                             Biopsied.                           - Normal duodenal bulb, first portion of the                            duodenum and second portion of the duodenum. Moderate Sedation:      Moderate (conscious) sedation was personally administered by an       anesthesia professional. The following parameters were monitored: oxygen       saturation, heart rate, blood pressure, and response to care. Recommendation:           - Return patient to hospital ward for ongoing care.                           - Soft diet.                           - Use Protonix (pantoprazole) 40 mg PO daily.                           - Nystatin suspension 100,000 units PO QID. Procedure Code(s):        --- Professional ---                           [phone], Esophagogastroduodenoscopy, flexible,                            transoral; with transendoscopic balloon dilation of                            esophagus (less than 30 mm diameter)                           43239, 59, Esophagogastroduodenoscopy, flexible,                            transoral; with biopsy, single or multiple Diagnosis Code(s):        --- Professional ---                           K21.0, Gastro-esophageal  reflux disease with                            esophagitis                           K22.2, Esophageal obstruction                           K22.9, Disease of esophagus, unspecified                           R13.12, Dysphagia, oropharyngeal phase                           R93.3, Abnormal findings on diagnostic imaging of                            other parts of digestive tract CPT copyright 2018 American [employer] Association. All rights reserved. The codes documented in this report are preliminary and upon coder review may  be revised to meet current compliance requirements. Kathi Der, MD Kathi Der, MD 10/04/2018 9:49:30 AM Number of Addenda: 0

## 2018-10-04 NOTE — Progress Notes (Signed)
OT Cancellation Note  Patient Details Name: Sabrina Douglas MRN: [id number] DOB: [birth year]   Cancelled Treatment:    Reason Eval/Treat Not Completed: Patient at procedure or test/ unavailable:  Pulmonary test per RN  Axcel Horsch 10/04/2018, 2:00 PM  Marica Otter, OTR/L Acute Rehabilitation Services [phone] [facility] pager [phone] office 10/04/2018

## 2018-10-04 NOTE — Transfer of Care (Signed)
Immediate Anesthesia Transfer of Care Note  Patient: Sabrina Douglas  Procedure(s) Performed: ESOPHAGOGASTRODUODENOSCOPY (EGD) WITH PROPOFOL (N/A ) BALLOON DILATION (N/A ) BIOPSY  Patient Location: PACU and Endoscopy Unit  Anesthesia Type:MAC  Level of Consciousness: awake, alert  and drowsy  Airway & Oxygen Therapy: Patient Spontanous Breathing and Patient connected to nasal cannula oxygen  Post-op Assessment: Report given to RN and Post -op Vital signs reviewed and stable  Post vital signs: Reviewed and stable  Last Vitals:  Vitals Value Taken Time  BP 133/67 10/04/2018  9:40 AM  Temp    Pulse 100 10/04/2018  9:42 AM  Resp 20 10/04/2018  9:42 AM  SpO2 100 % 10/04/2018  9:42 AM  Vitals shown include unvalidated device data.  Last Pain:  Vitals:   10/04/18 0838  TempSrc: Oral  PainSc: 0-No pain         Complications: No apparent anesthesia complications

## 2018-10-04 NOTE — Anesthesia Preprocedure Evaluation (Signed)
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   breath sounds clear to auscultation       Cardiovascular hypertension, Pt. on medications and Pt. on home beta blockers  Rhythm:Regular Rate:Normal     Neuro/Psych negative neurological ROS     GI/Hepatic Neg liver ROS, Dysphagia   Endo/Other  SIADH with hyponatremia  Renal/GU negative Renal ROS     Musculoskeletal   Abdominal   Peds  Hematology negative hematology ROS (+)   Anesthesia Other Findings   Reproductive/Obstetrics                             Lab Results  Component Value Date   WBC 3.2 (L) 10/03/2018   HGB 14.4 10/03/2018   HCT 42.9 10/03/2018   MCV 92.1 10/03/2018   PLT 336 10/03/2018   Lab Results  Component Value Date   CREATININE 0.66 10/04/2018   BUN 11 10/04/2018   NA 127 (L) 10/04/2018   K 3.6 10/04/2018   CL 83 (L) 10/04/2018   CO2 32 10/04/2018    Anesthesia Physical Anesthesia Plan  ASA: III  Anesthesia Plan: MAC   Post-op Pain Management:    Induction: Intravenous  PONV Risk Score and Plan: 2 and Propofol infusion, Ondansetron and Treatment may vary due to age or medical condition  Airway Management Planned: Natural Airway and Nasal Cannula  Additional Equipment:   Intra-op Plan:   Post-operative Plan:   Informed Consent: I have reviewed the patients History and Physical, chart, labs and discussed the procedure including the risks, benefits and alternatives for the proposed anesthesia with the patient or authorized representative who has indicated his/her understanding and acceptance.       Plan Discussed with: CRNA  Anesthesia Plan  Comments:         Anesthesia Quick Evaluation

## 2018-10-05 ENCOUNTER — Encounter (HOSPITAL_COMMUNITY): Payer: Self-pay | Admitting: Gastroenterology

## 2018-10-05 LAB — BASIC METABOLIC PANEL
Anion gap: 10 (ref 5–15)
BUN: 10 mg/dL (ref 8–23)
CO2: 34 mmol/L — ABNORMAL HIGH (ref 22–32)
Calcium: 8.6 mg/dL — ABNORMAL LOW (ref 8.9–10.3)
Chloride: 85 mmol/L — ABNORMAL LOW (ref 98–111)
Creatinine, Ser: 0.57 mg/dL (ref 0.44–1.00)
GFR calc Af Amer: >60 mL/min (ref >60)
GFR calc non Af Amer: >60 mL/min (ref >60)
Glucose, Bld: 116 mg/dL — ABNORMAL HIGH (ref 70–99)
Potassium: 3.3 mmol/L — ABNORMAL LOW (ref 3.5–5.1)
Sodium: 129 mmol/L — ABNORMAL LOW (ref 135–145)

## 2018-10-05 MED ORDER — POTASSIUM CHLORIDE 20 MEQ PO PACK
40.0000 meq | PACK | Freq: Once | ORAL | Status: AC
  Administered 2018-10-05: 40 meq via ORAL
  Filled 2018-10-05: qty 2

## 2018-10-05 MED ORDER — PANTOPRAZOLE SODIUM 40 MG PO TBEC: 40.0000 mg | DELAYED_RELEASE_TABLET | Freq: Every day | ORAL | 1 refills | Status: DC

## 2018-10-05 MED ORDER — ONDANSETRON 4 MG PO TBDP: 4.0000 mg | ORAL_TABLET | Freq: Three times a day (TID) | ORAL | 0 refills | Status: DC | PRN

## 2018-10-05 MED ORDER — FUROSEMIDE 40 MG PO TABS: 40.0000 mg | ORAL_TABLET | Freq: Every day | ORAL | Status: DC

## 2018-10-05 MED ORDER — SPIRONOLACTONE 12.5 MG HALF TABLET: 12.5000 mg | ORAL_TABLET | Freq: Every day | ORAL | Status: DC

## 2018-10-05 MED ORDER — NYSTATIN 100000 UNIT/ML MT SUSP: 500000.0000 [IU] | Freq: Four times a day (QID) | OROMUCOSAL | 0 refills | Status: DC

## 2018-10-05 MED ORDER — SIMVASTATIN 20 MG PO TABS: 20.0000 mg | ORAL_TABLET | Freq: Every day | ORAL | Status: DC

## 2018-10-05 MED ORDER — SPIRONOLACTONE 25 MG PO TABS: 25.0000 mg | ORAL_TABLET | Freq: Every day | ORAL | Status: DC

## 2018-10-05 MED ORDER — FUROSEMIDE 40 MG PO TABS: 40.0000 mg | ORAL_TABLET | Freq: Every day | ORAL | 3 refills | Status: DC

## 2018-10-05 MED ORDER — DEMECLOCYCLINE HCL 150 MG PO TABS: 150.0000 mg | ORAL_TABLET | Freq: Two times a day (BID) | ORAL | 0 refills | Status: DC

## 2018-10-05 MED ORDER — METOPROLOL TARTRATE 50 MG PO TABS: 50.0000 mg | ORAL_TABLET | Freq: Two times a day (BID) | ORAL | Status: DC

## 2018-10-05 NOTE — Discharge Summary (Signed)
Physician Discharge Summary   Patient ID: Sabrina Douglas MRN: [id number] DOB/AGE: [birth year] 80 y.o.  Admit date: 09/29/2018 Discharge date: 10/05/2018  Primary Care Physician:  Renaye Rakers, MD   Recommendations for Outpatient Follow-up:  1. Follow up with PCP in 1-2 weeks 2. Please obtain BMP/CBC, LFTs at the follow-up appointment  Home Health: Home health PT, OT, RN, aide Equipment/Devices:   Discharge Condition: stable CODE STATUS: FULL   Diet recommendation: Regular diet   Discharge Diagnoses:    . 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 ILD (interstitial lung disease) (HCC) . Essential hypertension . Lower extremity edema Severe pulmonary hypertension Dysphagia secondary to esophageal stricture Generalized debility Moderate protein calorie malnutrition Depression/anxiety  Consults:  Nephrology: Dr. Darrick Penna Cardiology: Dr. Delton See    Allergies:  No Known Allergies   DISCHARGE MEDICATIONS: Allergies as of 10/05/2018   No Known Allergies     Medication List    STOP taking these medications   citalopram 10 MG/5ML suspension Commonly known as:  CELEXA     TAKE these medications   albuterol 108 (90 Base) MCG/ACT inhaler Commonly known as:  PROVENTIL HFA;VENTOLIN HFA Inhale 2 puffs into the lungs every 6 (six) hours as needed for wheezing or shortness of breath.   ALPRAZolam 0.25 MG tablet Commonly known as:  XANAX Take 0.125-0.25 mg by mouth 2 (two) times daily as needed for anxiety.   [employer] DRY EYE RELIEF 0.2-0.2-1 % Soln Generic drug:  Glycerin-Hypromellose-PEG 400 Place 2 drops into both eyes as needed (Dry eyes).   demeclocycline 150 MG tablet Commonly known as:  DECLOMYCIN Take 1 tablet (150 mg total) by mouth 2 (two) times daily.   FLUTTER Devi 1 Device by Does not apply route as needed.   furosemide 40 MG tablet Commonly known as:  LASIX Take 1 tablet (40 mg total) by mouth daily. Start taking on:  October 06, 2018   metoprolol  tartrate 25 MG tablet Commonly known as:  LOPRESSOR Take 25 mg by mouth 2 (two) times daily.   multivitamin with minerals Tabs tablet Take 1 tablet by mouth daily.   nystatin 100000 UNIT/ML suspension Commonly known as:  MYCOSTATIN Take 5 mLs (500,000 Units total) by mouth 4 (four) times daily for 7 days. What changed:  additional instructions   ondansetron 4 MG disintegrating tablet Commonly known as:  ZOFRAN ODT Take 1 tablet (4 mg total) by mouth every 8 (eight) hours as needed for nausea or vomiting.   pantoprazole 40 MG tablet Commonly known as:  PROTONIX Take 1 tablet (40 mg total) by mouth daily.   simvastatin 20 MG tablet Commonly known as:  ZOCOR Take 1 tablet (20 mg total) by mouth daily. Hold until follow-up with PCP and labs What changed:  additional instructions   spironolactone 25 MG tablet Commonly known as:  ALDACTONE Take 25 mg by mouth daily.        Brief H and P: For complete details please refer to admission H and P, but in brief 78 year old female with history of hypertension, chronic LE edema and interstitial lung disease admitted with hyponatremia to 114 without major symptoms, dysphagia and about 20 pounds weight loss in the months. Recently started on Celexa. Has not been taking spironolactone for 2 weeks. Initially given IV normal saline bolus that made hyponatremia worse.   Nephrology was consulted. Normal saline was stopped and started on 3% NaCl with improvement in hyponatremia.  In regards tointerstitial lung disease and possible autoimmune process, no intervention inpatient per  pulmonology, Dr. Marchelle Gearing but she could be a candidate for [facility] outpatient. Patient's pulmonologist, Dr. Vassie Loll aware of patient's admission. Echocardiogram without significant structural or functional abnormality except for more bubble severe pulmonary hypertension. Cardiology consulted for this. 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  Sodium 129 at the time of discharge -Celexa discontinued. cortisol, TSH within normal range -Nephrology was consulted, recommended Lasix, fluid restriction, added demeclocycline -Sodium stable, nephrology recommended continue demeclocycline 150 mg p.o. twice a day for at least 2 to 3 weeks, Lasix 40 mg twice a day, follow-up outpatient with [facility] in 2 to 4 weeks.     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 Gala Romney scheduled on 10/11/2018  Dysphagia -SLP was consulted, barium swallow showed possible esophageal stricture -GI was consulted.  Patient underwent EGD which showed esophageal stricture, dilated.  EGD also showed reflux esophagitis, esophageal plaques suspicious for candidiasis, biopsied, normal duodenal bulb -Placed on soft diet, PPI, nystatin.  Outpatient follow-up with GI.    Essential hypertension -Currently stable, continue beta-blocker, on Lasix, Aldactone  Generalized debility -PT recommended home health PT  Moderate protein calorie malnutrition -Continue nutritional supplements  Depression/anxiety -Currently stable, SSRIs held.  Celexa discontinued  Mild AST elevation Statin held, please recheck LFTs  Hypokalemia Replaced  Day of Discharge S: Tolerating soft diet without any difficulty, wants to go home  BP (!) 126/57 (BP Location: Left Arm)   Pulse 78   Temp 98 F (36.7 C) (Oral)   Resp 16   Ht  (1.651 m)   Wt 64.2 kg   SpO2 93%   BMI 23.55 kg/m   Physical Exam: General: Alert and awake oriented x3 not in any acute distress. HEENT: anicteric sclera,  pupils reactive to light and accommodation CVS: S1-S2 clear no murmur rubs or gallops Chest: clear to auscultation bilaterally, no wheezing rales or rhonchi Abdomen: soft nontender, nondistended, normal bowel sounds Extremities: no cyanosis, clubbing or edema noted bilaterally Neuro: Cranial nerves II-XII intact, no focal neurological deficits   The results of significant diagnostics from this hospitalization (including imaging, microbiology, ancillary and laboratory) are listed below for reference.      Procedures/Studies:  Dg Chest 2 View  Result Date: 09/29/2018 CLINICAL DATA:  Shortness of breath. EXAM: CHEST - 2 VIEW COMPARISON:  Chest x-ray dated September 04, 2018. FINDINGS: The heart size and mediastinal contours are within normal limits. Normal pulmonary vascularity. Unchanged interstitial opacities at the left greater than right lung bases. No focal consolidation, pleural effusion, or pneumothorax. No acute osseous abnormality. Distended stomach. IMPRESSION: 1. Unchanged bibasilar interstitial lung disease.  No acute finding. Electronically Signed   By: Obie Dredge M.D.   On: 09/29/2018 14:46   Ct Chest High Resolution  Result Date: 09/10/2018 CLINICAL DATA:  78 year old female with history of unresolved pneumonia. Follow-up study. EXAM: CT CHEST WITHOUT CONTRAST TECHNIQUE: Multidetector CT imaging of the chest was performed following the standard protocol without intravenous contrast. High resolution imaging of the lungs, as well as inspiratory and expiratory imaging, was performed. COMPARISON:  Chest CT 09/04/2018. FINDINGS: Cardiovascular: Heart size is normal. There is no significant pericardial fluid, thickening or pericardial calcification. Aortic atherosclerosis. No definite coronary artery calcifications.  Mediastinum/Nodes: No pathologically enlarged mediastinal or hilar lymph nodes. Please note that accurate exclusion of hilar adenopathy is limited on noncontrast CT scans.  Esophagus is unremarkable in appearance. No axillary lymphadenopathy. Lungs/Pleura: High-resolution images demonstrate patchy areas of ground-glass attenuation, septal thickening, severe thickening of the peribronchovascular interstitium, cylindrical bronchiectasis and peripheral bronchiolectasis in the lungs bilaterally, most evident in the lower lobes. These findings have a definitive craniocaudal gradient. No honeycombing is identified. Inspiratory and expiratory imaging is unremarkable. No acute consolidative airspace disease. No pleural effusions. No definite suspicious appearing pulmonary nodules or masses are noted. Upper Abdomen: 1.5 cm exophytic low-attenuation lesion in the upper pole of the left kidney, incompletely characterized on today's noncontrast CT examination, but similar to the prior study and statistically likely a cyst. Musculoskeletal: There are no aggressive appearing lytic or blastic lesions noted in the visualized portions of the skeleton. IMPRESSION: 1. 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 Repeat high-resolution chest CT is recommended in 12 months to assess for temporal changes in the appearance of the lung parenchyma. 2. Aortic atherosclerosis. Aortic Atherosclerosis (ICD10-I70.0). Electronically Signed   By: Trudie Reed M.D.   On: 09/10/2018 08:17   Nm Pulmonary Perf And Vent  Result Date: 10/03/2018 CLINICAL DATA:  Shortness of Breath EXAM: NUCLEAR MEDICINE VENTILATION - PERFUSION LUNG SCAN VIEWS: Anterior, posterior, left lateral, right lateral, RPO, LPO, RAO, LAO-ventilation and perfusion RADIOPHARMACEUTICALS:  30.6 mCi of Tc-32m DTPA aerosol inhalation and 4.4 mCi Tc5m MAA IV COMPARISON:  Chest radiograph September 29, 2018 FINDINGS: Ventilation: Radiotracer uptake is homogeneous and symmetric bilaterally. No ventilation defects are evident. Perfusion:  Radiotracer uptake is homogeneous and symmetric bilaterally. No perfusion defects are evident. IMPRESSION: No appreciable ventilation or perfusion defects. Very low probability of pulmonary embolus. Electronically Signed   By: Bretta Bang III M.D.   On: 10/03/2018 14:01   Dg Esophagus W Single Cm (sol Or Thin Ba)  Result Date: 10/01/2018 CLINICAL DATA:  Dysphagia. EXAM: ESOPHOGRAM/BARIUM SWALLOW TECHNIQUE: Single contrast examination was performed using thin barium or water soluble. FLUOROSCOPY TIME:  Fluoroscopy Time:  1 minutes 36 seconds Radiation Exposure Index (if provided by the fluoroscopic device): 16.5 Number of Acquired Spot Images: 0 COMPARISON:  None. FINDINGS: The study is limited due to patient condition. The patient could not fully stand and could not drink quickly. There is altered peristalsis with slow transit of the barium through the esophagus. There is smooth narrowing of the distal esophagus throughout the study is which never completely opens up. No obvious mass. Limited views in the mucosa are unremarkable. Limited views of the stomach are normal. The patient could not tolerate swelling and tablet. IMPRESSION: 1. There is smooth narrowing of the distal esophagus which never opens up. The findings are suspicious or a benign stricture/narrowing, resulting in the patient's symptoms. Peristalsis is considered less likely as the finding is seen on multiple occasions and the distal esophagus did not open up despite multiple attempts. However, the patient could not tolerate a tablet for confirmation of a distal stricture. Electronically Signed   By: Gerome Sam III M.D   On: 10/01/2018 14:45       LAB RESULTS: Basic Metabolic Panel: Recent Labs  Lab 09/30/18 0348  10/03/18 0629  10/04/18 0649 10/04/18 1940 10/05/18 0329  NA  --    < > 126*   < > 127* 127* 129*  K  --    < >  4.0  --  3.6  --  3.3*  CL  --    < > 82*  --  83*  --  85*  CO2  --    < > 31  --  32  --  34*   GLUCOSE  --    < > 107*  --  119*  --  116*  BUN  --    < > 6*  --  11  --  10  CREATININE  --    < > 0.54  --  0.66  --  0.57  CALCIUM  --    < > 9.1  --  9.0  --  8.6*  MG 1.6*  --   --   --   --   --   --   PHOS  --   --  2.6  --   --   --   --    < > = values in this interval not displayed.   Liver Function Tests: Recent Labs  Lab 10/01/18 0404 10/03/18 0629  AST 57* 72*  ALT 31 32  ALKPHOS 62 65  BILITOT 0.4 0.6  PROT 6.6 7.4  ALBUMIN 3.1* 3.6  3.6   No results for input(s): LIPASE, AMYLASE in the last 168 hours. No results for input(s): AMMONIA in the last 168 hours. CBC: Recent Labs  Lab 09/29/18 1434  10/02/18 0805 10/03/18 0629  WBC 2.7*   < > 2.5* 3.2*  NEUTROABS 1.7  --   --   --   HGB 13.8   < > 13.2 14.4  HCT 40.6   < > 40.1 42.9  MCV 90.4   < > 93.0 92.1  PLT 331   < > 297 336   < > = values in this interval not displayed.   Cardiac Enzymes: Recent Labs  Lab 10/01/18 0404  CKTOTAL 381*  CKMB 133.0*   BNP: Invalid input(s): POCBNP CBG: No results for input(s): GLUCAP in the last 168 hours.    Disposition and Follow-up: Discharge Instructions    Discharge instructions   Complete by:  As directed    SOFT DIET with 1200 cc fluid restriction   Increase activity slowly   Complete by:  As directed        DISPOSITION: Home with home health  DISCHARGE FOLLOW-UP Follow-up Information    Bensimhon, Bevelyn Buckles, MD Follow up.   Specialty:  Cardiology Why:  Cardiology hospital follow up on 10/10/2017 at 3:20. Please call the office to get instructions on parking and the parking code.  Contact information: [address] [place] [address] [phone]        Renaye Rakers, MD. Schedule an appointment as soon as possible for a visit in 10 day(s).   Specialty:  Family Medicine Contact information: [address] [place] [address] [phone]        [facility]. Schedule an  appointment as soon as possible for a visit in 2 week(s).   Specialty:  Nephrology Why:  please call office to make an appointment in 2 weeks. Contact information: [address] [place] [address] [phone]        Kathi Der, MD. Schedule an appointment as soon as possible for a visit in 3 week(s).   Specialty:  Gastroenterology Contact information: [address] [place] [address] [phone]  Time coordinating discharge:  35 minutes  Signed:   Thad Ranger M.D. [facility] 10/05/2018, 11:09 AM

## 2018-10-05 NOTE — Progress Notes (Signed)
Occupational Therapy Treatment Patient Details Name: Sabrina Douglas MRN: [id number] DOB: 12/27/1940 Today's Date: 10/05/2018    History of present illness  Sabrina Douglas is a 78 y.o. female with a past medical history of essential hypertension, chronic lower extremity edema who is under evaluation by pulmonology for interstitial lung disease diagnosed recently. Admitted with weakness, difficulty swallowing, 20# weight loss and severe hyponatremia with a sodium level of 114.    OT comments  Pt is preparing for d/c.  Worked on dressing with education to adapt to decrease shoulder pain and make it less effortful. Also educated on RUE HEP  Follow Up Recommendations  Home health OT;Supervision/Assistance - 24 hour    Equipment Recommendations  None recommended by OT    Recommendations for Other Services      Precautions / Restrictions Precautions Precautions: Fall       Mobility Bed Mobility                  Transfers                      Balance                                           ADL either performed or assessed with clinical judgement   ADL                   Upper Body Dressing : Minimal assistance;Sitting   Lower Body Dressing: Minimal assistance;Moderate assistance;Sit to/from stand                 General ADL Comments: pt got dressed during OT session.  Educated to put RUE into shirt first. Pt wore both tank and button down shirts. She needed assistance to pull button down shirt around back to L side.  Pt reports less pain performing this way.  She tends to reach down for LB adls.  She would benefit from using kitchen tongs to extend reach, as she is almost able to do this and she doesn't have a reacher. She will have a lot of assistance initially.       Vision       Perception     Praxis      Cognition Arousal/Alertness: Awake/alert Behavior During Therapy: WFL for tasks assessed/performed Overall  Cognitive Status: Within Functional Limits for tasks assessed                                          Exercises Other Exercises Other Exercises: educated on lap slides, tabletop and AROM as tolerated   Shoulder Instructions       General Comments      Pertinent Vitals/ Pain       Pain Assessment: No/denies pain  Home Living                                          Prior Functioning/Environment              Frequency           Progress Toward Goals  OT Goals(current goals can now be found in the care plan section)  Progress towards OT goals: Progressing toward goals     Plan      Co-evaluation                 AM-PAC OT "6 Clicks" Daily Activity     Outcome Measure   Help from another person eating meals?: None Help from another person taking care of personal grooming?: A Little Help from another person toileting, which includes using toliet, bedpan, or urinal?: A Lot Help from another person bathing (including washing, rinsing, drying)?: A Little Help from another person to put on and taking off regular upper body clothing?: A Little Help from another person to put on and taking off regular lower body clothing?: A Lot 6 Click Score: 17    End of Session        Activity Tolerance Patient tolerated treatment well   Patient Left in chair;with call bell/phone within reach;with family/visitor present;with chair alarm set   Nurse Communication          Time: 4098-1191 OT Time Calculation (min): 13 min  Charges: OT General Charges $OT Visit: 1 Visit OT Treatments $Self Care/Home Management : 8-22 mins  Marica Otter, OTR/L Acute Rehabilitation Services [phone] [facility] pager [phone] office 10/05/2018   Sabrina Douglas 10/05/2018, 11:29 AM

## 2018-10-05 NOTE — Progress Notes (Signed)
Progress Note  Patient Name: Sabrina Douglas Date of Encounter: 10/05/2018  Primary Cardiologist: No primary care provider on file.   Subjective   She feels significantly better with improved LE edema and SOB.  Inpatient Medications    Scheduled Meds: . demeclocycline  150 mg Oral Q12H  . enoxaparin (LOVENOX) injection  40 mg Subcutaneous Daily  . feeding supplement  1 Container Oral TID BM  . [START ON 10/06/2018] furosemide  40 mg Oral Daily  . metoprolol tartrate  50 mg Oral BID  . multivitamin with minerals  1 tablet Oral Daily  . nystatin  5 mL Oral QID  . pantoprazole  40 mg Oral Daily  . potassium chloride  40 mEq Oral Once  . spironolactone  25 mg Oral Daily   Continuous Infusions:  PRN Meds: acetaminophen **OR** acetaminophen, albuterol, ALPRAZolam, hydrALAZINE, ondansetron **OR** ondansetron (ZOFRAN) IV, polyethylene glycol   Vital Signs    Vitals:   10/04/18 1135 10/04/18 1824 10/04/18 2024 10/05/18 0543  BP: (!) 123/58 (!) 126/44 110/66 (!) 126/57  Pulse: 100 100 (!) 105 78  Resp: Temp: 97.9 F (36.6 C) 97.9 F (36.6 C) 98 F (36.7 C) 98 F (36.7 C)  TempSrc: Oral Oral Oral Oral  SpO2: 95% 95% 94% 93%  Weight:    64.2 kg  Height:        Intake/Output Summary (Last 24 hours) at 10/05/2018 0844 Last data filed at 10/04/2018 2225 Gross per 24 hour  Intake 1300 ml  Output 400 ml  Net 900 ml   Last 3 Weights 10/05/2018 10/04/2018 10/04/2018  Weight (lbs) 141 lb 8 oz 140 lb 1.6 oz 140 lb 1.6 oz  Weight (kg) 64.184 kg 63.549 kg 63.549 kg      Telemetry    SR to ST - Personally Reviewed  Physical Exam   GEN: No acute distress.   Neck: 4 cm JVD Cardiac: RRR, 3/6 systolic murmur, rubs, or gallops.  Respiratory: Clear to auscultation bilaterally. GI: Soft, nontender, non-distended  MS: No edema; No deformity. Neuro:  Nonfocal  Psych: Normal affect   Labs    Chemistry Recent Labs  Lab 09/29/18 1434  10/01/18 0404  10/03/18 0629   10/04/18 0649 10/04/18 1940 10/05/18 0329  NA 114*   < > 123*   < > 126*   < > 127* 127* 129*  K 4.7   < > 4.2   < > 4.0  --  3.6  --  3.3*  CL 78*   < > 88*   < > 82*  --  83*  --  85*  CO2 26   < > 27   < > 31  --  32  --  34*  GLUCOSE 120*   < > 101*   < > 107*  --  119*  --  116*  BUN 6*   < > 5*   < > 6*  --  11  --  10  CREATININE 0.43*   < > 0.45   < > 0.54  --  0.66  --  0.57  CALCIUM 8.8*   < > 8.4*   < > 9.1  --  9.0  --  8.6*  PROT 7.6  --  6.6  --  7.4  --   --   --   --   ALBUMIN 3.7  --  3.1*  --  3.6  3.6  --   --   --   --  AST 83*  --  57*  --  72*  --   --   --   --   ALT 41  --  31  --  32  --   --   --   --   ALKPHOS 73  --  62  --  65  --   --   --   --   BILITOT 0.6  --  0.4  --  0.6  --   --   --   --   GFRNONAA >60   < > >60   < > >60  --  >60  --  >60  GFRAA >60   < > >60   < > >60  --  >60  --  >60  ANIONGAP 10   < > 8   < > 13  --  12  --  10   < > = values in this interval not displayed.     Hematology Recent Labs  Lab 09/30/18 0350 10/02/18 0805 10/03/18 0629  WBC 3.3* 2.5* 3.2*  RBC 4.30 4.31 4.66  HGB 13.0 13.2 14.4  HCT 39.0 40.1 42.9  MCV 90.7 93.0 92.1  MCH 30.2 30.6 30.9  MCHC 33.3 32.9 33.6  RDW 11.1* 11.5 11.7  PLT 320 297 336    Cardiac EnzymesNo results for input(s): TROPONINI in the last 168 hours.  Recent Labs  Lab 09/29/18 1439  TROPIPOC 0.02     BNPNo results for input(s): BNP, PROBNP in the last 168 hours.   DDimer No results for input(s): DDIMER in the last 168 hours.   Radiology    Nm Pulmonary Perf And Vent  Result Date: 10/03/2018 CLINICAL DATA:  Shortness of Breath EXAM: NUCLEAR MEDICINE VENTILATION - PERFUSION LUNG SCAN VIEWS: Anterior, posterior, left lateral, right lateral, RPO, LPO, RAO, LAO-ventilation and perfusion RADIOPHARMACEUTICALS:  30.6 mCi of Tc-3m DTPA aerosol inhalation and 4.4 mCi Tc3m MAA IV COMPARISON:  Chest radiograph September 29, 2018 FINDINGS: Ventilation: Radiotracer uptake is  homogeneous and symmetric bilaterally. No ventilation defects are evident. Perfusion: Radiotracer uptake is homogeneous and symmetric bilaterally. No perfusion defects are evident. IMPRESSION: No appreciable ventilation or perfusion defects. Very low probability of pulmonary embolus. Electronically Signed   By: Bretta Bang III M.D.   On: 10/03/2018 14:01    Cardiac Studies     Patient Profile     78 y.o. female with a hx of interstitial lung disease, HTN, chronic lower ext edemawho was admitted for the evaluation ofgeneralized weakness and difficulty swallowing after developing oral thrush on steroids, with significant weight loss recently, found to be hyponatremic of 114 on admission, initial Na level of 114. She was given NS in ER, then switched to 3% saline, her sodium has improved up to 134 now slightly down to 127. Nephrology is following.   The patient was fully independent and active till October 2019 when she was diagnosed with pneumonia with significant functional deterioration, after which she was diagnosed with interstitial lung disease and was slowly getting worse to the point that she is only able to do few steps before she gets short of breath.  Her physical exam shows mildly elevated JVDs, 3 out of 6 systolic murmur with prominent RV S3, no crackles over the lungs, minimal lower extremity edema. Her chest x-ray shows Unchanged bibasilar interstitial lung disease. No acute finding. I have reviewed her echocardiogram and it shows LVEF of 70%, more normal diastolic function, moderately dilated right ventricle with  normal systolic function and elevated RVSP of 63 mmHg.  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 twice a day for at least 2 to 3 weeks, Lasix 40 mg twice a day, follow-up outpatient with [facility] in 2 to 4 weeks.  V/Q scan showed no appreciable ventilation or perfusion defects. Very low probability of pulmonary embolus.  PFTs showed minimal obstruction, severe restriction and severe diffusion defect.   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 twice a day for at least 2 to 3 weeks, Lasix 40 mg twice a day, follow-up outpatient with [facility] in 2 to 4 weeks.  V/Q scan showed no appreciable ventilation or perfusion defects. Very low probability of pulmonary embolus.  PFTs showed minimal obstruction, severe restriction and severe diffusion defect.   [facility] will sign off.   Medication Recommendations:  As above Other recommendations (labs, testing, etc): No further testing Follow up as an outpatient: He is scheduled to see Dr. Gala Romney on on October 11, 2018 at 3:20 PM for evaluation and potential treatment of pulmonary hypertension.  For questions or updates, please contact [facility] Please consult [email or web address] for contact info under     Signed, Tobias Alexander, MD  10/05/2018, 8:44 AM

## 2018-10-05 NOTE — Care Management Note (Signed)
Case Management Note  Patient Details  Name: ALTA DEWILDE MRN: [id number] Date of Birth: [birth year]  Pt chose [facility] as she has used them before. Referral called to [facility] rep.   Expected Discharge Date:  10/05/18               Expected Discharge Plan:  Home w Home Health Services  In-House Referral:     Discharge planning Services  CM Consult  Post Acute Care Choice:  Home Health Choice offered to:  Patient  DME Arranged:    DME Agency:     HH Arranged:  PT HH Agency:  [facility]  Status of Service:  Completed, signed off  If discussed at Long Length of Stay Meetings, dates discussed:    Additional CommentsBartholome Bill, RN 10/05/2018, 12:21 PM [phone]

## 2018-10-10 ENCOUNTER — Ambulatory Visit: Payer: Medicare Other | Admitting: Internal Medicine

## 2018-10-10 ENCOUNTER — Encounter: Payer: Self-pay | Admitting: Internal Medicine

## 2018-10-10 VITALS — BP 110/80 | HR 100 | Temp 98.2°F | Ht 65.0 in | Wt 142.7 lb

## 2018-10-10 DIAGNOSIS — E440 Moderate protein-calorie malnutrition: Secondary | ICD-10-CM

## 2018-10-10 DIAGNOSIS — Z23 Encounter for immunization: Secondary | ICD-10-CM

## 2018-10-10 DIAGNOSIS — E871 Hypo-osmolality and hyponatremia: Secondary | ICD-10-CM | POA: Diagnosis not present

## 2018-10-10 DIAGNOSIS — I10 Essential (primary) hypertension: Secondary | ICD-10-CM

## 2018-10-10 DIAGNOSIS — F419 Anxiety disorder, unspecified: Secondary | ICD-10-CM

## 2018-10-10 DIAGNOSIS — B370 Candidal stomatitis: Secondary | ICD-10-CM

## 2018-10-10 DIAGNOSIS — J849 Interstitial pulmonary disease, unspecified: Secondary | ICD-10-CM | POA: Diagnosis not present

## 2018-10-10 DIAGNOSIS — K222 Esophageal obstruction: Secondary | ICD-10-CM

## 2018-10-10 DIAGNOSIS — I272 Pulmonary hypertension, unspecified: Secondary | ICD-10-CM

## 2018-10-10 DIAGNOSIS — F320 Major depressive disorder, single episode, mild: Secondary | ICD-10-CM

## 2018-10-10 MED ORDER — METOPROLOL TARTRATE 25 MG PO TABS: 25.0000 mg | ORAL_TABLET | Freq: Two times a day (BID) | ORAL | 1 refills | Status: DC

## 2018-10-10 MED ORDER — NYSTATIN 100000 UNIT/ML MT SUSP: 500000.0000 [IU] | Freq: Four times a day (QID) | OROMUCOSAL | 0 refills | Status: DC

## 2018-10-10 NOTE — Progress Notes (Signed)
New Patient Office Visit     CC/Reason for Visit: establish care/new patient/Hospital follow up Previous PCP: Dr. Renaye Rakers Last Visit: December 2019  HPI: Sabrina Douglas is a 78 y.o. female who is coming in today for the above mentioned reasons. Past Medical History is significant for hypertension, hyperlipidemia, ILD, anxiety, depression, pulmonary htn and hyponatremia. Patient lives alone. Patient came in today with a family member to establish care as a new patient.  Patient was recently admitted to the hospital 2-22 of this year due to hyponatremia/SIADH and on 2-27 she had her esophagus stretched by GI prior to discharge.  She also saw nephrology while in the hospital as well.  She follows up with nephrology within the next two weeks.  She says she doesn't feel hungry and when she does eat she doesn't get a lot of taste out of what she eats.  Patient is restricting her fluid intake to 1 liter a day (she "thinks" she is doing this correctly but she is not actually measuring her daily fluid intake).  She sees Dr. Gala Romney tomorrow with cardiology due to pulmonary htn, and an appointment with GI in 3 weeks for follow up.  Patient will f/u with Dr. Vassie Loll on March 9th due to ILD.  Patient says she's anxious due to all of these health problems that she's dealing with.  Patient also c/o sleep problems, she has issues falling asleep, and was given 20 of xanax to help with sleep.     Past Medical/Surgical History: Past Medical History:  Diagnosis Date  . Anxiety   . Depression   . Hyperlipidemia     Past Surgical History:  Procedure Laterality Date  . BALLOON DILATION N/A 10/04/2018   Procedure: BALLOON DILATION;  Surgeon: Kathi Der, MD;  Location: [facility];  Service: Gastroenterology;  Laterality: N/A;  . BIOPSY  10/04/2018   Procedure: BIOPSY;  Surgeon: Kathi Der, MD;  Location: [facility];  Service: Gastroenterology;;  . ESOPHAGOGASTRODUODENOSCOPY (EGD) WITH  PROPOFOL N/A 10/04/2018   Procedure: ESOPHAGOGASTRODUODENOSCOPY (EGD) WITH PROPOFOL;  Surgeon: Kathi Der, MD;  Location: [facility];  Service: Gastroenterology;  Laterality: N/A;    Social History:  reports that she has never smoked. She has never used smokeless tobacco. She reports that she does not drink alcohol. No history on file for drug.  Allergies: No Known Allergies  Family History:  Family History  Problem Relation Age of Onset  . Sudden death Mother 66       sudden death post hysterectomy  . Prostate cancer Father   . Diabetes Father      Current Outpatient Medications:  .  albuterol (PROVENTIL HFA;VENTOLIN HFA) 108 (90 Base) MCG/ACT inhaler, Inhale 2 puffs into the lungs every 6 (six) hours as needed for wheezing or shortness of breath., Disp: 1 Inhaler, Rfl: 6 .  ALPRAZolam (XANAX) 0.25 MG tablet, Take 0.125-0.25 mg by mouth 2 (two) times daily as needed for anxiety. , Disp: , Rfl:  .  demeclocycline (DECLOMYCIN) 150 MG tablet, Take 1 tablet (150 mg total) by mouth 2 (two) times daily., Disp: 60 tablet, Rfl: 0 .  furosemide (LASIX) 40 MG tablet, Take 1 tablet (40 mg total) by mouth daily., Disp: 30 tablet, Rfl: 3 .  Glycerin-Hypromellose-PEG 400 ([employer] DRY EYE RELIEF) 0.2-0.2-1 % SOLN, Place 2 drops into both eyes as needed (Dry eyes)., Disp: , Rfl:  .  metoprolol tartrate (LOPRESSOR) 25 MG tablet, Take 1 tablet (25 mg total) by mouth 2 (two)  times daily., Disp: 180 tablet, Rfl: 1 .  Multiple Vitamin (MULTIVITAMIN WITH MINERALS) TABS tablet, Take 1 tablet by mouth daily., Disp: , Rfl:  .  nystatin (MYCOSTATIN) 100000 UNIT/ML suspension, Take 5 mLs (500,000 Units total) by mouth 4 (four) times daily for 7 days., Disp: 60 mL, Rfl: 0 .  Respiratory Therapy Supplies (FLUTTER) DEVI, 1 Device by Does not apply route as needed., Disp: 1 each, Rfl: 0 .  simvastatin (ZOCOR) 20 MG tablet, Take 1 tablet (20 mg total) by mouth daily. Hold until follow-up with PCP and labs  (Patient not taking: Reported on 10/10/2018), Disp: 30 tablet, Rfl:  .  spironolactone (ALDACTONE) 25 MG tablet, Take 25 mg by mouth daily., Disp: , Rfl:   Review of Systems:  Constitutional: Denies fever, chills, diaphoresis, appetite change and fatigue.  HEENT: Denies photophobia, eye pain, redness, hearing loss, ear pain, congestion, sore throat, rhinorrhea, sneezing, mouth sores, trouble swallowing, neck pain, neck stiffness and tinnitus.   Respiratory: Denies SOB, cough, chest tightness,  and wheezing.  C/o DOE Cardiovascular: Denies chest pain, palpitations.  C/o swelling in BLE Gastrointestinal: Denies nausea, vomiting, abdominal pain, diarrhea, blood in stool and abdominal distention. C/o chronic constipation Genitourinary: Denies dysuria, urgency, frequency, hematuria, flank pain and difficulty urinating.  Endocrine: Denies: hot or cold intolerance, sweats, changes in hair or nails, polyuria, polydipsia. Musculoskeletal: Denies myalgias, back pain, joint swelling, arthralgias and gait problem. C/o right shoulder weakness and pain Skin: Denies pallor, rash and wound.  Neurological: Denies dizziness, seizures, syncope, weakness, light-headedness, numbness and headaches.  Hematological: Denies adenopathy. Easy bruising, personal or family bleeding history  Psychiatric/Behavioral: Denies suicidal ideation, mood changes, confusion, nervousness and agitation.  C/o difficulty falling asleep   Physical Exam: Vitals:   10/10/18 1527  BP: 110/80  Pulse: 100  Temp: 98.2 F (36.8 C)  TempSrc: Oral  SpO2: 97%  Weight: 142 lb 11.2 oz (64.7 kg)  Height: 5\' 5"  (1.651 m)   Body mass index is 23.75 kg/m.  Constitutional: NAD, calm, comfortable Eyes: PERRL, lids and conjunctivae normal Respiratory: clear to auscultation bilaterally, no wheezing, no crackles. Normal respiratory effort. No accessory muscle use.  Cardiovascular: Regular rate and rhythm, no murmurs / rubs / gallops. No extremity  edema. 2+ pedal pulses.  Psychiatric: Normal judgment and insight. Alert and oriented x 3. Normal mood.    Impression and Plan:  Malnutrition of moderate degree -cont to monitor -she says she is taking in more food since hospital d/c -BMI is adequate  Essential hypertension -cont to monitor, well controlled  ILD (interstitial lung disease) (HCC) -sees pulm Dr. Vassie Loll March 9th  Hyponatremia/SIADH -Was thought to have SIADH from ILD while hospitalized. -BMET today for follow up on Na levels. -Instructed to be meticulous with measuring fluid intake and making sure she adheres to 1L fluids a day. -Continue lasix and demeclocycline per nephro recs. Has follow up with them scheduled for later this month.  Pulmonary HTN -follows up with cardio tomorrow  Anxiety & depression -PHQ-9= 8 -no time to address today.  Insomnia -start taking melatonin at night as needed for sleep, no further xanax to be prescribed for now.  Time spent: 45 minutes. Greater than 50% of this time was spent in direct contact with the patient and her daughter, coordinating care and discussing relevant ongoing clinical issues, including treatment of SIADH and recommendations from hospitalist following recent hospital discharge..     Patient Instructions  Great meeting you this afternoon.  Instructions: -Pneumonia & Flu  Vaccines will be given today -lab work today,will notify you when results are available -Keep your appointments with Cardio, Nephrology, Pulmonary and GI -Follow up in 3 months       Murlean Iba, RN DNP Student [facility]

## 2018-10-10 NOTE — Patient Instructions (Addendum)
Great meeting you this afternoon.  Instructions: -Pneumonia & Flu Vaccines will be given today -lab work today,will notify you when results are available -Keep your appointments with Cardio, Nephrology, Pulmonary and GI -Follow up in 3 months

## 2018-10-11 ENCOUNTER — Ambulatory Visit (HOSPITAL_COMMUNITY)
Admit: 2018-10-11 | Discharge: 2018-10-11 | Disposition: A | Discharge status: Home / Self Care | Payer: Medicare Other | Source: Ambulatory Visit | Attending: Internal Medicine | Admitting: Internal Medicine

## 2018-10-11 VITALS — BP 104/74 | HR 110 | Wt 142.4 lb

## 2018-10-11 DIAGNOSIS — I272 Pulmonary hypertension, unspecified: Secondary | ICD-10-CM

## 2018-10-11 DIAGNOSIS — I7300 Raynaud's syndrome without gangrene: Secondary | ICD-10-CM | POA: Diagnosis not present

## 2018-10-11 DIAGNOSIS — Z79899 Other long term (current) drug therapy: Secondary | ICD-10-CM | POA: Diagnosis not present

## 2018-10-11 DIAGNOSIS — F419 Anxiety disorder, unspecified: Secondary | ICD-10-CM | POA: Insufficient documentation

## 2018-10-11 DIAGNOSIS — M329 Systemic lupus erythematosus, unspecified: Secondary | ICD-10-CM | POA: Diagnosis not present

## 2018-10-11 DIAGNOSIS — E785 Hyperlipidemia, unspecified: Secondary | ICD-10-CM | POA: Diagnosis not present

## 2018-10-11 DIAGNOSIS — M1711 Unilateral primary osteoarthritis, right knee: Secondary | ICD-10-CM | POA: Diagnosis not present

## 2018-10-11 DIAGNOSIS — R634 Abnormal weight loss: Secondary | ICD-10-CM | POA: Insufficient documentation

## 2018-10-11 DIAGNOSIS — J9611 Chronic respiratory failure with hypoxia: Secondary | ICD-10-CM | POA: Insufficient documentation

## 2018-10-11 DIAGNOSIS — E871 Hypo-osmolality and hyponatremia: Secondary | ICD-10-CM | POA: Diagnosis not present

## 2018-10-11 DIAGNOSIS — R1310 Dysphagia, unspecified: Secondary | ICD-10-CM | POA: Diagnosis not present

## 2018-10-11 DIAGNOSIS — Z7901 Long term (current) use of anticoagulants: Secondary | ICD-10-CM | POA: Diagnosis not present

## 2018-10-11 DIAGNOSIS — R05 Cough: Secondary | ICD-10-CM | POA: Insufficient documentation

## 2018-10-11 LAB — CBC
HCT: 44.5 % (ref 36.0–46.0)
Hemoglobin: 14.3 g/dL (ref 12.0–15.0)
MCH: 30.4 pg (ref 26.0–34.0)
MCHC: 32.1 g/dL (ref 30.0–36.0)
MCV: 94.7 fL (ref 80.0–100.0)
Platelets: 385 10*3/uL (ref 150–400)
RBC: 4.7 MIL/uL (ref 3.87–5.11)
RDW: 11.9 % (ref 11.5–15.5)
WBC: 3.7 10*3/uL — ABNORMAL LOW (ref 4.0–10.5)
nRBC: 0 % (ref 0.0–0.2)

## 2018-10-11 LAB — BASIC METABOLIC PANEL
BUN: 12 mg/dL (ref 6–23)
CO2: 38 mEq/L — ABNORMAL HIGH (ref 19–32)
Calcium: 10.1 mg/dL (ref 8.4–10.5)
Chloride: 91 mEq/L — ABNORMAL LOW (ref 96–112)
Creatinine, Ser: 0.68 mg/dL (ref 0.40–1.20)
GFR: 100.91 mL/min (ref >60.00)
Glucose, Bld: 97 mg/dL (ref 70–99)
Potassium: 4.1 mEq/L (ref 3.5–5.1)
Sodium: 136 mEq/L (ref 135–145)

## 2018-10-11 LAB — PROTIME-INR
INR: 1.1 (ref 0.8–1.2)
Prothrombin Time: 13.7 seconds (ref 11.4–15.2)

## 2018-10-11 NOTE — Consult Note (Signed)
 ADVANCED HF CLINIC CONSULT NOTE  Referring Physician: Dr. Katarina Nelson   HPI:   77 year old female woman with h/o hypertension, hyperlipidemia, anxiety/depression referred by Dr. Nelson for evaluation of pulmonary HTN.  Patient seen for initial consult with Dr. Alva on 08/28/18. CXR showed mildly worsened bibasilar pneumonia, suggestive of atypical infection d/t waxing/weaning symptoms. 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 Ordered for HRCT, concern for ILD. RX short course prednisone. ANA positive, CCP negative. 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   Admitted 2/26-2/28 with severe weakness. Fount to have hyponatremia at 114, dysphagia and about 20 pounds weight loss in the months. Treated with 3% saline and eventually demeclocycline. Prior to d/c had VQ and echo as above. Tested for need for home O2 and was told she didn't need it.   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 Has lost 30 pounds over past few months. Is weak. Can do ADLs slowly. Can get around the house by her herself. Mild edema. No orthopnea or PND. + dry cough. + Raynaud's. + long h/o eczema. Mild arthritis in R knee. No other arthralgias    Labs  ANA 1:1280 (pos) ESR 104  RF 55 (pos) RNP > 8.0  (pos) AntiJo negative CCP < 16 Anti-Smith 2.0 (pos) Scl-70 < 1.0 ANCA negative  Echo 09/30/18  1. The left ventricle has hyperdynamic systolic function, with an ejection fraction of >65%. The cavity size was normal. Left ventricular diastolic parameters were normal No evidence of left ventricular regional wall motion abnormalities.  2. No evidence of left ventricular regional wall motion abnormalities.  3. The right ventricle has normal systolic function. The cavity was grossly normal. RA-RV peak gradient increased at 66.9 mmHg consistent with probable severe pulmonary hypertension. Could not estimate CVP however.  4. The aortic valve is  tricuspid Mild aortic annular calcification noted.  5. The mitral valve is normal in structure.  6. The tricuspid valve is normal in structure. Tricuspid valve regurgitation is   PFTs  (10/02/18) FEV1 1.13 (66%) FVC   1.23 (56%) DLCO 16%  VQ scan 10/03/18: Negative  Review of Systems: [y] = yes, [ ] = no   General: Weight gain [ ]; Weight loss [ ]; Anorexia [ ]; Fatigue [ ]; Fever [ ]; Chills [ ]; Weakness [ y]  Cardiac: Chest pain/pressure [ ]; Resting SOB [ ]; Exertional SOB [y ]; Orthopnea [ ]; Pedal Edema [ ]; Palpitations [ ]; Syncope [ ]; Presyncope [ ]; Paroxysmal nocturnal dyspnea[ ]  Pulmonary: Cough [ ]; Wheezing[ ]; Hemoptysis[ ]; Sputum [ ]; Snoring [ ]  GI: Vomiting[ ]; Dysphagia[ ]; Melena[ ]; Hematochezia [ ]; Heartburn[ ]; Abdominal pain [ ]; Constipation [ ]; Diarrhea [ ]; BRBPR [ ]  GU: Hematuria[ ]; Dysuria [ ]; Nocturia[ ]  Vascular: Pain in legs with walking [ ]; Pain in feet with lying flat [ ]; Non-healing sores [ ]; Stroke [ ]; TIA [ ]; Slurred speech [ ];  Neuro: Headaches[ ]; Vertigo[ ]; Seizures[ ]; Paresthesias[ ];Blurred vision [ ]; Diplopia [ ]; Vision changes [ ]  Ortho/Skin: Arthritis [y ]; Joint pain [ ]; Muscle pain [ ]; Joint swelling [ ]; Back Pain [ ]; Rash [ ]  Psych: Depression[y ]; Anxiety[y ]  Heme: Bleeding problems [ ]; Clotting disorders [ ]; Anemia [ ]  Endocrine: Diabetes [ ]; Thyroid dysfunction[ ]   Past Medical History:    Diagnosis Date  . Anxiety   . Depression   . Hyperlipidemia     Current Outpatient Medications  Medication Sig Dispense Refill  . albuterol (PROVENTIL HFA;VENTOLIN HFA) 108 (90 Base) MCG/ACT inhaler Inhale 2 puffs into the lungs every 6 (six) hours as needed for wheezing or shortness of breath. 1 Inhaler 6  . ALPRAZolam (XANAX) 0.25 MG tablet Take 0.125-0.25 mg by mouth 2 (two) times daily as needed for anxiety.     . demeclocycline (DECLOMYCIN) 150 MG tablet Take 1 tablet (150 mg total) by mouth 2 (two) times daily.  60 tablet 0  . furosemide (LASIX) 40 MG tablet Take 1 tablet (40 mg total) by mouth daily. 30 tablet 3  . Glycerin-Hypromellose-PEG 400 ([facility] DRY EYE RELIEF) 0.2-0.2-1 % SOLN Place 2 drops into both eyes as needed (Dry eyes).    . metoprolol tartrate (LOPRESSOR) 25 MG tablet Take 1 tablet (25 mg total) by mouth 2 (two) times daily. 180 tablet 1  . Multiple Vitamin (MULTIVITAMIN WITH MINERALS) TABS tablet Take 1 tablet by mouth daily.    . nystatin (MYCOSTATIN) 100000 UNIT/ML suspension Take 5 mLs (500,000 Units total) by mouth 4 (four) times daily for 7 days. 60 mL 0  . pantoprazole (PROTONIX) 40 MG tablet Take 40 mg by mouth daily.    . Respiratory Therapy Supplies (FLUTTER) DEVI 1 Device by Does not apply route as needed. 1 each 0  . simvastatin (ZOCOR) 20 MG tablet Take 1 tablet (20 mg total) by mouth daily. Hold until follow-up with PCP and labs 30 tablet   . spironolactone (ALDACTONE) 25 MG tablet Take 25 mg by mouth daily.     No current facility-administered medications for this encounter.     No Known Allergies    Social History   Socioeconomic History  . Marital status: Divorced    Spouse name: Not on file  . Number of children: Not on file  . Years of education: Not on file  . Highest education level: Not on file  Occupational History  . Not on file  Social Needs  . Financial resource strain: Not on file  . Food insecurity:    Worry: Not on file    Inability: Not on file  . Transportation needs:    Medical: Not on file    Non-medical: Not on file  Tobacco Use  . Smoking status: Never Smoker  . Smokeless tobacco: Never Used  Substance and Sexual Activity  . Alcohol use: Never    Frequency: Never  . Drug use: Not on file  . Sexual activity: Not on file  Lifestyle  . Physical activity:    Days per week: Not on file    Minutes per session: Not on file  . Stress: Not on file  Relationships  . Social connections:    Talks on phone: Not on file    Gets together:  Not on file    Attends religious service: Not on file    Active member of club or organization: Not on file    Attends meetings of clubs or organizations: Not on file    Relationship status: Not on file  . Intimate partner violence:    Fear of current or ex partner: Not on file    Emotionally abused: Not on file    Physically abused: Not on file    Forced sexual activity: Not on file  Other Topics Concern  . Not on file  Social History Narrative  . Not on   file      Family History  Problem Relation Age of Onset  . Sudden death Mother 58       sudden death post hysterectomy  . Prostate cancer Father   . Diabetes Father     Vitals:   10/11/18 1525  BP: 104/74  Pulse: (!) 110  SpO2: 95%  Weight: 64.6 kg (142 lb 6.4 oz)    PHYSICAL EXAM: General:  Elderly. No respiratory difficulty HEENT: normal Neck: supple. no JVD. Carotids 2+ bilat; no bruits. No lymphadenopathy or thryomegaly appreciated. Cor: PMI nondisplaced. Tachy regular. No rubs, gallops or murmurs. Lungs: crackles at bases Abdomen: soft, nontender, nondistended. No hepatosplenomegaly. No bruits or masses. Good bowel sounds. Extremities: no cyanosis, clubbing, rash, edema Neuro: alert & oriented x 3, cranial nerves grossly intact. moves all 4 extremities w/o difficulty. Affect pleasant.  ECG: NSR 87 LAFB No ST-T wave abnormalities.    ASSESSMENT & PLAN:  1. 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 - Will plan RHC to further assess hemodynamics and refer to Dr. Beek man in Rheumatology.   2. Chronic hypoxic respiratory failure - likely due to PAH and ILD with  sats down to low 80s with hall walk and back to 90% on 2L - will start home O2 - Refer to Pulmonary rehab - Continue to follow with Pulmonary.   Tiarrah Saville, MD  3:30 PM   

## 2018-10-11 NOTE — H&P (View-Only) (Signed)
ADVANCED HF CLINIC CONSULT NOTE  Referring Physician: Dr. Ena Dawley   HPI:   78 year old female woman with h/o hypertension, hyperlipidemia, anxiety/depression referred by Dr. 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  Patient seen for initial consult with Dr. [name] on 08/28/18. CXR showed mildly worsened bibasilar pneumonia, suggestive of atypical infection d/t waxing/weaning symptoms. 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 Ordered for HRCT, concern for ILD. RX short course prednisone. ANA positive, CCP negative. 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   Admitted 2/26-2/28 with severe weakness. Fount to have hyponatremia at 114, dysphagia and about 20 pounds weight loss in the months. Treated with 3% saline and eventually demeclocycline. Prior to d/c had VQ and echo as above. Tested for need for home O2 and was told she didn't need it.   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. Has lost 30 pounds over past few months. Is weak. Can do ADLs slowly. Can get around the house by her herself. Mild edema. No orthopnea or PND. + dry cough. + Raynaud's. + long h/o eczema. Mild arthritis in R knee. No other arthralgias    Labs  ANA 1:1280 (pos) ESR 104  RF 55 (pos) RNP > 8.0  (pos) AntiJo negative CCP < 16 Anti-Smith 2.0 (pos) Scl-70 < 1.0 ANCA negative  Echo 09/30/18  1. The left ventricle has hyperdynamic systolic function, with an ejection fraction of >65%. The cavity size was normal. Left ventricular diastolic parameters were normal No evidence of left ventricular regional wall motion abnormalities.  2. No evidence of left ventricular regional wall motion abnormalities.  3. The right ventricle has normal systolic function. The cavity was grossly normal. RA-RV peak gradient increased at 66.9 mmHg consistent with probable severe pulmonary hypertension. Could not estimate CVP however.  4. The aortic valve is  tricuspid Mild aortic annular calcification noted.  5. The mitral valve is normal in structure.  6. The tricuspid valve is normal in structure. Tricuspid valve regurgitation is   PFTs  (10/02/18) FEV1 1.13 (66%) FVC   1.23 (56%) DLCO 16%  VQ scan 10/03/18: Negative  Review of Systems: [y] = yes, _0  = no   General: Weight gain _1 ; Weight loss _2 ; Anorexia _3 ; Fatigue _4 ; Fever _5 ; Chills _6 ; Weakness [ y]  Cardiac: Chest pain/pressure _7 ; Resting SOB _8 ; Exertional SOB Blue.Reese ]; Orthopnea _9 ; Pedal Edema _10 ; Palpitations _11 ; Syncope _12 ; Presyncope _13 ; Paroxysmal nocturnal dyspnea_14   Pulmonary: Cough _15 ; Wheezing_16 ; Hemoptysis_17 ; Sputum _18 ; Snoring _19   GI: Vomiting_20 ; Dysphagia_21 ; Melena_22 ; Hematochezia _23 ; Heartburn_24 ; Abdominal pain _25 ; Constipation _26 ; Diarrhea _27 ; BRBPR _28   GU: Hematuria_29 ; Dysuria _30 ; Nocturia_31   Vascular: Pain in legs with walking _32 ; Pain in feet with lying flat _33 ; Non-healing sores _34 ; Stroke _35 ; TIA _36 ; Slurred speech _37 ;  Neuro: Headaches_38 ; Vertigo_39 ; Seizures_40 ; Paresthesias_41 ;Blurred vision _42 ; Diplopia _43 ; Vision changes _44   Ortho/Skin: Arthritis Blue.Reese ]; Joint pain _45 ; Muscle pain _46 ; Joint swelling _47 ; Back Pain _48 ; Rash _49   Psych: Depression[y ]; Anxiety[y ]  Heme: Bleeding problems _50 ; Clotting disorders _51 ; Anemia _52   Endocrine: Diabetes _53 ; Thyroid dysfunction_54    Past Medical History:  Diagnosis Date  . Anxiety   . Depression   . Hyperlipidemia     Current Outpatient Medications  Medication Sig Dispense Refill  . albuterol (PROVENTIL HFA;VENTOLIN HFA) 108 (90 Base) MCG/ACT inhaler Inhale 2 puffs into the lungs every 6 (six) hours as needed for wheezing or shortness of breath. 1 Inhaler 6  . ALPRAZolam (XANAX) 0.25 MG tablet Take 0.125-0.25 mg by mouth 2 (two) times daily as needed for anxiety.     Marland Kitchen demeclocycline (DECLOMYCIN) 150 MG tablet Take 1 tablet (150 mg total) by mouth 2 (two) times daily.  60 tablet 0  . furosemide (LASIX) 40 MG tablet Take 1 tablet (40 mg total) by mouth daily. 30 tablet 3  . Glycerin-Hypromellose-PEG 400 ([facility] DRY EYE RELIEF) 0.2-0.2-1 % SOLN Place 2 drops into both eyes as needed (Dry eyes).    . metoprolol tartrate (LOPRESSOR) 25 MG tablet Take 1 tablet (25 mg total) by mouth 2 (two) times daily. 180 tablet 1  . Multiple Vitamin (MULTIVITAMIN WITH MINERALS) TABS tablet Take 1 tablet by mouth daily.    Marland Kitchen nystatin (MYCOSTATIN) 100000 UNIT/ML suspension Take 5 mLs (500,000 Units total) by mouth 4 (four) times daily for 7 days. 60 mL 0  . pantoprazole (PROTONIX) 40 MG tablet Take 40 mg by mouth daily.    Marland Kitchen Respiratory Therapy Supplies (FLUTTER) DEVI 1 Device by Does not apply route as needed. 1 each 0  . simvastatin (ZOCOR) 20 MG tablet Take 1 tablet (20 mg total) by mouth daily. Hold until follow-up with PCP and labs 30 tablet   . spironolactone (ALDACTONE) 25 MG tablet Take 25 mg by mouth daily.     No current facility-administered medications for this encounter.     No Known Allergies    Social History   Socioeconomic History  . Marital status: Divorced    Spouse name: Not on file  . Number of children: Not on file  . Years of education: Not on file  . Highest education level: Not on file  Occupational History  . Not on file  Social Needs  . Financial resource strain: Not on file  . Food insecurity:    Worry: Not on file    Inability: Not on file  . Transportation needs:    Medical: Not on file    Non-medical: Not on file  Tobacco Use  . Smoking status: Never Smoker  . Smokeless tobacco: Never Used  Substance and Sexual Activity  . Alcohol use: Never    Frequency: Never  . Drug use: Not on file  . Sexual activity: Not on file  Lifestyle  . Physical activity:    Days per week: Not on file    Minutes per session: Not on file  . Stress: Not on file  Relationships  . Social connections:    Talks on phone: Not on file    Gets together:  Not on file    Attends religious service: Not on file    Active member of club or organization: Not on file    Attends meetings of clubs or organizations: Not on file    Relationship status: Not on file  . Intimate partner violence:    Fear of current or ex partner: Not on file    Emotionally abused: Not on file    Physically abused: Not on file    Forced sexual activity: Not on file  Other Topics Concern  . Not on file  Social History Narrative  . Not on  file      Family History  Problem Relation Age of Onset  . Sudden death Mother 69       sudden death post hysterectomy  . Prostate cancer Father   . Diabetes Father     Vitals:   10/11/18 1525  BP: 104/74  Pulse: (!) 110  SpO2: 95%  Weight: 64.6 kg (142 lb 6.4 oz)    PHYSICAL EXAM: General:  Elderly. No respiratory difficulty HEENT: normal Neck: supple. no JVD. Carotids 2+ bilat; no bruits. No lymphadenopathy or thryomegaly appreciated. Cor: PMI nondisplaced. Tachy regular. No rubs, gallops or murmurs. Lungs: crackles at bases Abdomen: soft, nontender, nondistended. No hepatosplenomegaly. No bruits or masses. Good bowel sounds. Extremities: no cyanosis, clubbing, rash, edema Neuro: alert & oriented x 3, cranial nerves grossly intact. moves all 4 extremities w/o difficulty. Affect pleasant.  ECG: NSR 87 LAFB No ST-T wave abnormalities.    ASSESSMENT & PLAN:  1. 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 - Will plan RHC to further assess hemodynamics and refer to Dr. Dulcy Fanny man in Rheumatology.   2. Chronic hypoxic respiratory failure - likely due to [facility] and ILD with  sats down to low 80s with hall walk and back to 90% on 2L - will start home O2 - Refer to Pulmonary rehab - Continue to follow with Pulmonary.   Glori Bickers, MD  3:30 PM

## 2018-10-11 NOTE — Patient Instructions (Signed)
You have been referred to Rheumatology Clinic to see Dr Dierdre Forth, this office will call to schedule an appointment with you.  Labs were done today. We will call you with any ABNORMAL results. No news is good news!  Your physician recommends that you schedule a follow-up appointment in: 2 MONTHS    [facility] [address] [id number] [facility] [place] [address] Dept: [phone] Loc: [phone]  Amry [place]ut  10/11/2018  You are scheduled for a Cardiac Catheterization on Monday, March 9 with Dr. Arvilla Meres.  1. Please arrive at the [facility] (Main Entrance A) at [facility]: [address] [place], [place] [address] at 5:30 AM (This time is two hours before your procedure to ensure your preparation). Free valet parking service is available.   Special note: Every effort is made to have your procedure done on time. Please understand that emergencies sometimes delay scheduled procedures.  2. Diet: Do not eat solid foods after midnight.  The patient may have clear liquids until 5am upon the day of the procedure.   3. Medication instructions in preparation for your procedure:   Contrast Allergy: No   HOLD ALL MORNING MEDICATIONS THE DAY OF THE PROCEDURE.  5. Plan for one night stay--bring personal belongings. 6. Bring a current list of your medications and current insurance cards. 7. You MUST have a responsible person to drive you home. 8. Someone MUST be with you the first 24 hours after you arrive home or your discharge will be delayed. 9. Please wear clothes that are easy to get on and off and wear slip-on shoes.  Thank you for allowing [place] to care for you!   -- [facility]

## 2018-10-11 NOTE — Progress Notes (Signed)
Ambulation: starting O2 93% HR 96, while ambulating O2 fell to 80% HR 111.  Pt noted feeling "winded".  Returned to room, Dr Gala Romney aware, O2 order to be faxed.

## 2018-10-15 ENCOUNTER — Other Ambulatory Visit: Payer: Self-pay

## 2018-10-15 ENCOUNTER — Ambulatory Visit (HOSPITAL_COMMUNITY)
Admission: RE | Admit: 2018-10-15 | Discharge: 2018-10-15 | Disposition: A | Discharge status: Home / Self Care | Payer: Medicare Other | Attending: Internal Medicine | Admitting: Internal Medicine

## 2018-10-15 ENCOUNTER — Encounter (HOSPITAL_COMMUNITY): Payer: Self-pay | Admitting: Internal Medicine

## 2018-10-15 ENCOUNTER — Inpatient Hospital Stay: Payer: Medicare Other | Admitting: Pulmonary Disease

## 2018-10-15 ENCOUNTER — Telehealth (HOSPITAL_COMMUNITY): Payer: Self-pay

## 2018-10-15 ENCOUNTER — Encounter (HOSPITAL_COMMUNITY)
Admission: RE | Disposition: A | Discharge status: Home / Self Care | Payer: Self-pay | Source: Home / Self Care | Attending: Internal Medicine

## 2018-10-15 DIAGNOSIS — Z79899 Other long term (current) drug therapy: Secondary | ICD-10-CM | POA: Diagnosis not present

## 2018-10-15 DIAGNOSIS — J9611 Chronic respiratory failure with hypoxia: Secondary | ICD-10-CM | POA: Insufficient documentation

## 2018-10-15 DIAGNOSIS — M1711 Unilateral primary osteoarthritis, right knee: Secondary | ICD-10-CM | POA: Diagnosis not present

## 2018-10-15 DIAGNOSIS — E785 Hyperlipidemia, unspecified: Secondary | ICD-10-CM | POA: Diagnosis not present

## 2018-10-15 DIAGNOSIS — I7300 Raynaud's syndrome without gangrene: Secondary | ICD-10-CM | POA: Insufficient documentation

## 2018-10-15 DIAGNOSIS — I2721 Secondary pulmonary arterial hypertension: Secondary | ICD-10-CM | POA: Diagnosis not present

## 2018-10-15 DIAGNOSIS — I272 Pulmonary hypertension, unspecified: Secondary | ICD-10-CM

## 2018-10-15 HISTORY — PX: RIGHT HEART CATH: CATH118263

## 2018-10-15 LAB — POCT I-STAT EG7
Acid-Base Excess: 7 mmol/L — ABNORMAL HIGH (ref 0.0–2.0)
Acid-Base Excess: 8 mmol/L — ABNORMAL HIGH (ref 0.0–2.0)
Acid-Base Excess: 8 mmol/L — ABNORMAL HIGH (ref 0.0–2.0)
Acid-Base Excess: 8 mmol/L — ABNORMAL HIGH (ref 0.0–2.0)
Acid-Base Excess: 9 mmol/L — ABNORMAL HIGH (ref 0.0–2.0)
Bicarbonate: 36.1 mmol/L — ABNORMAL HIGH (ref 20.0–28.0)
Bicarbonate: 36.9 mmol/L — ABNORMAL HIGH (ref 20.0–28.0)
Bicarbonate: 37.1 mmol/L — ABNORMAL HIGH (ref 20.0–28.0)
Bicarbonate: 38 mmol/L — ABNORMAL HIGH (ref 20.0–28.0)
Bicarbonate: 38.1 mmol/L — ABNORMAL HIGH (ref 20.0–28.0)
Calcium, Ion: 1.18 mmol/L (ref 1.15–1.40)
Calcium, Ion: 1.19 mmol/L (ref 1.15–1.40)
Calcium, Ion: 1.2 mmol/L (ref 1.15–1.40)
Calcium, Ion: 1.2 mmol/L (ref 1.15–1.40)
Calcium, Ion: 1.26 mmol/L (ref 1.15–1.40)
HCT: 37 % (ref 36.0–46.0)
HCT: 37 % (ref 36.0–46.0)
HCT: 38 % (ref 36.0–46.0)
HCT: 38 % (ref 36.0–46.0)
HCT: 38 % (ref 36.0–46.0)
Hemoglobin: 12.6 g/dL (ref 12.0–15.0)
Hemoglobin: 12.6 g/dL (ref 12.0–15.0)
Hemoglobin: 12.9 g/dL (ref 12.0–15.0)
Hemoglobin: 12.9 g/dL (ref 12.0–15.0)
Hemoglobin: 12.9 g/dL (ref 12.0–15.0)
O2 Saturation: 81 %
O2 Saturation: 82 %
O2 Saturation: 83 %
O2 Saturation: 83 %
O2 Saturation: 89 %
Potassium: 3.2 mmol/L — ABNORMAL LOW (ref 3.5–5.1)
Potassium: 3.2 mmol/L — ABNORMAL LOW (ref 3.5–5.1)
Potassium: 3.2 mmol/L — ABNORMAL LOW (ref 3.5–5.1)
Potassium: 3.3 mmol/L — ABNORMAL LOW (ref 3.5–5.1)
Potassium: 3.3 mmol/L — ABNORMAL LOW (ref 3.5–5.1)
Sodium: 140 mmol/L (ref 135–145)
Sodium: 141 mmol/L (ref 135–145)
Sodium: 141 mmol/L (ref 135–145)
Sodium: 141 mmol/L (ref 135–145)
Sodium: 141 mmol/L (ref 135–145)
TCO2: 38 mmol/L — ABNORMAL HIGH (ref 22–32)
TCO2: 39 mmol/L — ABNORMAL HIGH (ref 22–32)
TCO2: 39 mmol/L — ABNORMAL HIGH (ref 22–32)
TCO2: 40 mmol/L — ABNORMAL HIGH (ref 22–32)
TCO2: 40 mmol/L — ABNORMAL HIGH (ref 22–32)
pCO2, Ven: 76.1 mmHg (ref 44.0–60.0)
pCO2, Ven: 76.4 mmHg (ref 44.0–60.0)
pCO2, Ven: 77.4 mmHg (ref 44.0–60.0)
pCO2, Ven: 77.9 mmHg (ref 44.0–60.0)
pCO2, Ven: 80.2 mmHg (ref 44.0–60.0)
pH, Ven: 7.283 (ref 7.250–7.430)
pH, Ven: 7.284 (ref 7.250–7.430)
pH, Ven: 7.289 (ref 7.250–7.430)
pH, Ven: 7.292 (ref 7.250–7.430)
pH, Ven: 7.297 (ref 7.250–7.430)
pO2, Ven: 53 mmHg — ABNORMAL HIGH (ref 32.0–45.0)
pO2, Ven: 54 mmHg — ABNORMAL HIGH (ref 32.0–45.0)
pO2, Ven: 56 mmHg — ABNORMAL HIGH (ref 32.0–45.0)
pO2, Ven: 56 mmHg — ABNORMAL HIGH (ref 32.0–45.0)
pO2, Ven: 68 mmHg — ABNORMAL HIGH (ref 32.0–45.0)

## 2018-10-15 SURGERY — RIGHT HEART CATH
Anesthesia: LOCAL

## 2018-10-15 MED ORDER — HEPARIN (PORCINE) IN NACL 1000-0.9 UT/500ML-% IV SOLN
INTRAVENOUS | Status: DC | PRN
  Administered 2018-10-15: 500 mL

## 2018-10-15 MED ORDER — ONDANSETRON HCL 4 MG/2ML IJ SOLN: 4.0000 mg | Freq: Four times a day (QID) | INTRAMUSCULAR | Status: DC | PRN

## 2018-10-15 MED ORDER — MIDAZOLAM HCL 2 MG/2ML IJ SOLN
INTRAMUSCULAR | Status: AC
  Filled 2018-10-15: qty 2

## 2018-10-15 MED ORDER — SODIUM CHLORIDE 0.9 % IV SOLN
INTRAVENOUS | Status: DC
  Administered 2018-10-15: 06:00:00 via INTRAVENOUS

## 2018-10-15 MED ORDER — FENTANYL CITRATE (PF) 100 MCG/2ML IJ SOLN
INTRAMUSCULAR | Status: AC
  Filled 2018-10-15: qty 2

## 2018-10-15 MED ORDER — ACETAMINOPHEN 325 MG PO TABS: 650.0000 mg | ORAL_TABLET | ORAL | Status: DC | PRN

## 2018-10-15 MED ORDER — LIDOCAINE HCL (PF) 1 % IJ SOLN
INTRAMUSCULAR | Status: AC
  Filled 2018-10-15: qty 30

## 2018-10-15 MED ORDER — SODIUM CHLORIDE 0.9% FLUSH: 3.0000 mL | Freq: Two times a day (BID) | INTRAVENOUS | Status: DC

## 2018-10-15 MED ORDER — HEPARIN (PORCINE) IN NACL 1000-0.9 UT/500ML-% IV SOLN
INTRAVENOUS | Status: AC
  Filled 2018-10-15: qty 500

## 2018-10-15 MED ORDER — SODIUM CHLORIDE 0.9% FLUSH: 3.0000 mL | INTRAVENOUS | Status: DC | PRN

## 2018-10-15 MED ORDER — LIDOCAINE HCL (PF) 1 % IJ SOLN
INTRAMUSCULAR | Status: DC | PRN
  Administered 2018-10-15: 2 mL

## 2018-10-15 MED ORDER — MIDAZOLAM HCL 2 MG/2ML IJ SOLN
INTRAMUSCULAR | Status: DC | PRN
  Administered 2018-10-15: 1 mg via INTRAVENOUS

## 2018-10-15 MED ORDER — SODIUM CHLORIDE 0.9 % IV SOLN: 250.0000 mL | INTRAVENOUS | Status: DC | PRN

## 2018-10-15 MED ORDER — FENTANYL CITRATE (PF) 100 MCG/2ML IJ SOLN
INTRAMUSCULAR | Status: DC | PRN
  Administered 2018-10-15: 25 ug via INTRAVENOUS

## 2018-10-15 MED ORDER — ASPIRIN 81 MG PO CHEW
81.0000 mg | CHEWABLE_TABLET | ORAL | Status: AC
  Administered 2018-10-15: 81 mg via ORAL
  Filled 2018-10-15: qty 1

## 2018-10-15 SURGICAL SUPPLY — 9 items
CATH BALLN WEDGE 5F 110CM (CATHETERS) ×1 IMPLANT
KIT MICROPUNCTURE NIT STIFF (SHEATH) ×1 IMPLANT
PACK CARDIAC CATHETERIZATION (CUSTOM PROCEDURE TRAY) ×2 IMPLANT
PROTECTION STATION PRESSURIZED (MISCELLANEOUS) ×2
SHEATH GLIDE SLENDER 4/5FR (SHEATH) ×1 IMPLANT
SHEATH PROBE COVER 6X72 (BAG) ×1 IMPLANT
STATION PROTECTION PRESSURIZED (MISCELLANEOUS) IMPLANT
TRANSDUCER W/STOPCOCK (MISCELLANEOUS) ×2 IMPLANT
TUBING CIL FLEX 10 FLL-RA (TUBING) ×2 IMPLANT

## 2018-10-15 NOTE — Telephone Encounter (Signed)
Office note complete for last visit 10/10/18.  Prescription for O2 concentrator, demographics, and office visit note faxed to [facility] [phone].

## 2018-10-15 NOTE — Discharge Instructions (Signed)
°This sheet gives you information about how to care for yourself after your procedure. Your health care provider may also give you more specific instructions. If you have problems or questions, contact your health care provider. °What can I expect after the procedure? °After the procedure, it is common to have: °· Bruising and tenderness at the catheter insertion area. °Follow these instructions at home: °Medicines °· Take over-the-counter and prescription medicines only as told by your health care provider. °Insertion site care °· Follow instructions from your health care provider about how to take care of your insertion site. Make sure you: °? Wash your hands with soap and water before you change your bandage (dressing). If soap and water are not available, use hand sanitizer. °? Change your dressing as told by your health care provider. °? Leave stitches (sutures), skin glue, or adhesive strips in place. These skin closures may need to stay in place for 2 weeks or longer. If adhesive strip edges start to loosen and curl up, you may trim the loose edges. Do not remove adhesive strips completely unless your health care provider tells you to do that. °· Check your insertion site every day for signs of infection. Check for: °? Redness, swelling, or pain. °? Fluid or blood. °? Pus or a bad smell. °? Warmth. °· Do not take baths, swim, or use a hot tub until your health care provider approves. °· You may shower 24-48 hours after the procedure, or as directed by your health care provider. °? Remove the dressing and gently wash the site with plain soap and water. °? Pat the area dry with a clean towel. °? Do not rub the site. That could cause bleeding. °· Do not apply powder or lotion to the site. °Activity ° °· For 24 hours after the procedure, or as directed by your health care provider: °? Do not flex or bend the affected arm. °? Do not push or pull heavy objects with the affected arm. °? Do not drive yourself home  from the hospital or clinic. You may drive 24 hours after the procedure unless your health care provider tells you not to. °? Do not operate machinery or power tools. °· Do not lift anything that is heavier than 10 lb (4.5 kg), or the limit that you are told, until your health care provider says that it is safe. °· Ask your health care provider when it is okay to: °? Return to work or school. °? Resume usual physical activities or sports. °? Resume sexual activity. °General instructions °· If the catheter site starts to bleed, raise your arm and put firm pressure on the site. If the bleeding does not stop, get help right away. This is a medical emergency. °· If you went home on the same day as your procedure, a responsible adult should be with you for the first 24 hours after you arrive home. °· Keep all follow-up visits as told by your health care provider. This is important. °Contact a health care provider if: °· You have a fever. °· You have redness, swelling, or yellow drainage around your insertion site. °Get help right away if: °· You have unusual pain at the radial site. °· The catheter insertion area swells very fast. °· The insertion area is bleeding, and the bleeding does not stop when you hold steady pressure on the area. °· Your arm or hand becomes pale, cool, tingly, or numb. °These symptoms may represent a serious problem that is an emergency.   Do not wait to see if the symptoms will go away. Get medical help right away. Call your local emergency services (911 in the [place] Do not drive yourself to the hospital. °Summary °· After the procedure, it is common to have bruising and tenderness at the site. °· Follow instructions from your health care provider about how to take care of your radial site wound. Check the wound every day for signs of infection. °· Do not lift anything that is heavier than 10 lb (4.5 kg), or the limit that you are told, until your health care provider says that it is safe. °This  information is not intended to replace advice given to you by your health care provider. Make sure you discuss any questions you have with your health care provider. °Document Released: 08/27/2010 Document Revised: 08/30/2017 Document Reviewed: 08/30/2017 °Elsevier Interactive Patient Education © 2019 [employer] ° °

## 2018-10-15 NOTE — Interval H&P Note (Signed)
History and Physical Interval Note:  10/15/2018 7:47 AM  Sabrina Douglas  has presented today for surgery, with the diagnosis of Pulmonary Hypertension.  The various methods of treatment have been discussed with the patient and family. After consideration of risks, benefits and other options for treatment, the patient has consented to  Procedure(s): RIGHT HEART CATH (N/A) as a surgical intervention.  The patient's history has been reviewed, patient examined, no change in status, stable for surgery.  I have reviewed the patient's chart and labs.  Questions were answered to the patient's satisfaction.     Elsia Lasota

## 2018-10-16 ENCOUNTER — Observation Stay (HOSPITAL_COMMUNITY)
Admission: EM | Admit: 2018-10-16 | Discharge: 2018-10-17 | Disposition: A | Discharge status: Home / Self Care | Payer: Medicare Other | Attending: Internal Medicine | Admitting: Internal Medicine

## 2018-10-16 ENCOUNTER — Encounter (HOSPITAL_COMMUNITY): Payer: Self-pay | Admitting: Emergency Medicine

## 2018-10-16 ENCOUNTER — Other Ambulatory Visit (HOSPITAL_COMMUNITY): Payer: Self-pay

## 2018-10-16 ENCOUNTER — Other Ambulatory Visit: Payer: Self-pay | Admitting: Cardiology

## 2018-10-16 ENCOUNTER — Encounter: Payer: Self-pay | Admitting: *Deleted

## 2018-10-16 ENCOUNTER — Emergency Department (HOSPITAL_COMMUNITY): Payer: Medicare Other

## 2018-10-16 ENCOUNTER — Telehealth: Payer: Self-pay | Admitting: Cardiovascular Disease

## 2018-10-16 DIAGNOSIS — I480 Paroxysmal atrial fibrillation: Secondary | ICD-10-CM | POA: Diagnosis present

## 2018-10-16 DIAGNOSIS — I4510 Unspecified right bundle-branch block: Secondary | ICD-10-CM | POA: Insufficient documentation

## 2018-10-16 DIAGNOSIS — E871 Hypo-osmolality and hyponatremia: Secondary | ICD-10-CM | POA: Diagnosis present

## 2018-10-16 DIAGNOSIS — I10 Essential (primary) hypertension: Secondary | ICD-10-CM | POA: Diagnosis present

## 2018-10-16 DIAGNOSIS — Z6823 Body mass index (BMI) 23.0-23.9, adult: Secondary | ICD-10-CM | POA: Diagnosis not present

## 2018-10-16 DIAGNOSIS — I959 Hypotension, unspecified: Secondary | ICD-10-CM | POA: Diagnosis present

## 2018-10-16 DIAGNOSIS — I272 Pulmonary hypertension, unspecified: Secondary | ICD-10-CM | POA: Diagnosis present

## 2018-10-16 DIAGNOSIS — M6281 Muscle weakness (generalized): Secondary | ICD-10-CM | POA: Insufficient documentation

## 2018-10-16 DIAGNOSIS — E440 Moderate protein-calorie malnutrition: Secondary | ICD-10-CM | POA: Diagnosis present

## 2018-10-16 DIAGNOSIS — E785 Hyperlipidemia, unspecified: Secondary | ICD-10-CM | POA: Diagnosis not present

## 2018-10-16 DIAGNOSIS — J8410 Pulmonary fibrosis, unspecified: Secondary | ICD-10-CM | POA: Insufficient documentation

## 2018-10-16 DIAGNOSIS — Z79899 Other long term (current) drug therapy: Secondary | ICD-10-CM | POA: Insufficient documentation

## 2018-10-16 DIAGNOSIS — R05 Cough: Secondary | ICD-10-CM | POA: Insufficient documentation

## 2018-10-16 DIAGNOSIS — I4891 Unspecified atrial fibrillation: Secondary | ICD-10-CM

## 2018-10-16 DIAGNOSIS — F329 Major depressive disorder, single episode, unspecified: Secondary | ICD-10-CM | POA: Diagnosis not present

## 2018-10-16 DIAGNOSIS — R600 Localized edema: Secondary | ICD-10-CM | POA: Diagnosis present

## 2018-10-16 DIAGNOSIS — E876 Hypokalemia: Secondary | ICD-10-CM | POA: Insufficient documentation

## 2018-10-16 DIAGNOSIS — R1310 Dysphagia, unspecified: Secondary | ICD-10-CM | POA: Diagnosis not present

## 2018-10-16 DIAGNOSIS — F419 Anxiety disorder, unspecified: Secondary | ICD-10-CM | POA: Insufficient documentation

## 2018-10-16 DIAGNOSIS — I952 Hypotension due to drugs: Secondary | ICD-10-CM

## 2018-10-16 DIAGNOSIS — I482 Chronic atrial fibrillation, unspecified: Secondary | ICD-10-CM

## 2018-10-16 DIAGNOSIS — R0600 Dyspnea, unspecified: Secondary | ICD-10-CM

## 2018-10-16 DIAGNOSIS — J849 Interstitial pulmonary disease, unspecified: Secondary | ICD-10-CM

## 2018-10-16 DIAGNOSIS — R0902 Hypoxemia: Secondary | ICD-10-CM | POA: Diagnosis not present

## 2018-10-16 LAB — I-STAT TROPONIN, ED: Troponin i, poc: 0.07 ng/mL (ref 0.00–0.08)

## 2018-10-16 LAB — BASIC METABOLIC PANEL
Anion gap: 13 (ref 5–15)
BUN: 13 mg/dL (ref 8–23)
CO2: 27 mmol/L (ref 22–32)
Calcium: 8.8 mg/dL — ABNORMAL LOW (ref 8.9–10.3)
Chloride: 99 mmol/L (ref 98–111)
Creatinine, Ser: 0.87 mg/dL (ref 0.44–1.00)
GFR calc Af Amer: >60 mL/min (ref >60)
GFR calc non Af Amer: >60 mL/min (ref >60)
Glucose, Bld: 156 mg/dL — ABNORMAL HIGH (ref 70–99)
Potassium: 3.2 mmol/L — ABNORMAL LOW (ref 3.5–5.1)
Sodium: 139 mmol/L (ref 135–145)

## 2018-10-16 LAB — CBC WITH DIFFERENTIAL/PLATELET
Abs Immature Granulocytes: 0.3 10*3/uL — ABNORMAL HIGH (ref 0.00–0.07)
Basophils Absolute: 0 10*3/uL (ref 0.0–0.1)
Basophils Relative: 0 %
Eosinophils Absolute: 0 10*3/uL (ref 0.0–0.5)
Eosinophils Relative: 0 %
HCT: 37.5 % (ref 36.0–46.0)
Hemoglobin: 12 g/dL (ref 12.0–15.0)
Lymphocytes Relative: 6 %
Lymphs Abs: 0.2 10*3/uL — ABNORMAL LOW (ref 0.7–4.0)
MCH: 31.2 pg (ref 26.0–34.0)
MCHC: 32 g/dL (ref 30.0–36.0)
MCV: 97.4 fL (ref 80.0–100.0)
Monocytes Absolute: 0.3 10*3/uL (ref 0.1–1.0)
Monocytes Relative: 9 %
Myelocytes: 2 %
Neutro Abs: 2.8 10*3/uL (ref 1.7–7.7)
Neutrophils Relative %: 77 %
Platelets: 261 10*3/uL (ref 150–400)
Promyelocytes Relative: 6 %
RBC: 3.85 MIL/uL — ABNORMAL LOW (ref 3.87–5.11)
RDW: 12.2 % (ref 11.5–15.5)
WBC: 3.7 10*3/uL — ABNORMAL LOW (ref 4.0–10.5)
nRBC: 0 % (ref 0.0–0.2)
nRBC: 0 /100 WBC

## 2018-10-16 LAB — MAGNESIUM: Magnesium: 1.7 mg/dL (ref 1.7–2.4)

## 2018-10-16 LAB — BRAIN NATRIURETIC PEPTIDE: B Natriuretic Peptide: 38.4 pg/mL (ref 0.0–100.0)

## 2018-10-16 LAB — PATHOLOGIST SMEAR REVIEW: Path Review: REACTIVE

## 2018-10-16 MED ORDER — AMIODARONE HCL IN DEXTROSE 360-4.14 MG/200ML-% IV SOLN
60.0000 mg/h | INTRAVENOUS | Status: DC
  Administered 2018-10-16 (×2): 60 mg/h via INTRAVENOUS
  Filled 2018-10-16: qty 200

## 2018-10-16 MED ORDER — APIXABAN 5 MG PO TABS
5.0000 mg | ORAL_TABLET | Freq: Two times a day (BID) | ORAL | Status: DC
  Administered 2018-10-16 – 2018-10-17 (×3): 5 mg via ORAL
  Filled 2018-10-16 (×3): qty 1

## 2018-10-16 MED ORDER — ACETAMINOPHEN 325 MG PO TABS: 650.0000 mg | ORAL_TABLET | Freq: Four times a day (QID) | ORAL | Status: DC | PRN

## 2018-10-16 MED ORDER — SODIUM CHLORIDE 0.9 % IV BOLUS
1000.0000 mL | Freq: Once | INTRAVENOUS | Status: AC
  Administered 2018-10-16: 1000 mL via INTRAVENOUS

## 2018-10-16 MED ORDER — HEPARIN (PORCINE) 25000 UT/250ML-% IV SOLN
900.0000 [IU]/h | INTRAVENOUS | Status: DC
  Administered 2018-10-16: 900 [IU]/h via INTRAVENOUS
  Filled 2018-10-16: qty 250

## 2018-10-16 MED ORDER — LEVALBUTEROL HCL 0.63 MG/3ML IN NEBU: 0.6300 mg | INHALATION_SOLUTION | Freq: Four times a day (QID) | RESPIRATORY_TRACT | Status: DC | PRN

## 2018-10-16 MED ORDER — HEPARIN BOLUS VIA INFUSION
3000.0000 [IU] | Freq: Once | INTRAVENOUS | Status: AC
  Administered 2018-10-16: 3000 [IU] via INTRAVENOUS
  Filled 2018-10-16: qty 3000

## 2018-10-16 MED ORDER — HEPARIN (PORCINE) 25000 UT/250ML-% IV SOLN: 12.0000 [IU]/kg/h | INTRAVENOUS | Status: DC

## 2018-10-16 MED ORDER — ALPRAZOLAM 0.25 MG PO TABS: 0.1250 mg | ORAL_TABLET | Freq: Two times a day (BID) | ORAL | Status: DC | PRN

## 2018-10-16 MED ORDER — PANTOPRAZOLE SODIUM 40 MG PO TBEC
40.0000 mg | DELAYED_RELEASE_TABLET | Freq: Every day | ORAL | Status: DC
  Administered 2018-10-16 – 2018-10-17 (×2): 40 mg via ORAL
  Filled 2018-10-16 (×2): qty 1

## 2018-10-16 MED ORDER — POTASSIUM CHLORIDE 10 MEQ/100ML IV SOLN
10.0000 meq | INTRAVENOUS | Status: AC
  Administered 2018-10-16 (×4): 10 meq via INTRAVENOUS
  Filled 2018-10-16 (×3): qty 100

## 2018-10-16 MED ORDER — IOHEXOL 350 MG/ML SOLN
80.0000 mL | Freq: Once | INTRAVENOUS | Status: AC | PRN
  Administered 2018-10-16: 80 mL via INTRAVENOUS

## 2018-10-16 MED ORDER — AMIODARONE LOAD VIA INFUSION
150.0000 mg | Freq: Once | INTRAVENOUS | Status: AC
  Administered 2018-10-16: 150 mg via INTRAVENOUS
  Filled 2018-10-16: qty 83.34

## 2018-10-16 MED ORDER — MAGNESIUM OXIDE 400 (241.3 MG) MG PO TABS
400.0000 mg | ORAL_TABLET | Freq: Two times a day (BID) | ORAL | Status: DC
  Administered 2018-10-16 – 2018-10-17 (×3): 400 mg via ORAL
  Filled 2018-10-16 (×3): qty 1

## 2018-10-16 MED ORDER — DEMECLOCYCLINE HCL 150 MG PO TABS
150.0000 mg | ORAL_TABLET | Freq: Two times a day (BID) | ORAL | Status: DC
  Administered 2018-10-16 – 2018-10-17 (×2): 150 mg via ORAL
  Filled 2018-10-16 (×3): qty 1

## 2018-10-16 MED ORDER — LEVALBUTEROL HCL 0.63 MG/3ML IN NEBU: 0.6300 mg | INHALATION_SOLUTION | RESPIRATORY_TRACT | Status: DC | PRN

## 2018-10-16 MED ORDER — AMIODARONE HCL IN DEXTROSE 360-4.14 MG/200ML-% IV SOLN
30.0000 mg/h | INTRAVENOUS | Status: DC
  Administered 2018-10-16: 30 mg/h via INTRAVENOUS
  Filled 2018-10-16: qty 200

## 2018-10-16 MED ORDER — GUAIFENESIN ER 600 MG PO TB12: 1200.0000 mg | ORAL_TABLET | Freq: Two times a day (BID) | ORAL | Status: DC | PRN

## 2018-10-16 MED ORDER — HEPARIN (PORCINE) 25000 UT/250ML-% IV SOLN
900.0000 [IU]/h | INTRAVENOUS | Status: DC
  Administered 2018-10-16: 12 [IU]/kg/h via INTRAVENOUS

## 2018-10-16 MED ORDER — ACETAMINOPHEN 650 MG RE SUPP: 650.0000 mg | Freq: Four times a day (QID) | RECTAL | Status: DC | PRN

## 2018-10-16 MED ORDER — POTASSIUM CHLORIDE CRYS ER 20 MEQ PO TBCR
20.0000 meq | EXTENDED_RELEASE_TABLET | Freq: Two times a day (BID) | ORAL | Status: DC
  Administered 2018-10-16 – 2018-10-17 (×3): 20 meq via ORAL
  Filled 2018-10-16 (×3): qty 1

## 2018-10-16 NOTE — Consult Note (Signed)
Cardiology Consult    Patient ID: Sabrina Douglas MRN: [id number], DOB/AGE: [birth year]   Admit date: 10/16/2018 Date of Consult: 10/16/2018  Primary Physician: Philip Aspen, Limmie Patricia, MD Primary Cardiologist: No primary care provider on file. Requesting Provider: Dr. April Palumbo  Patient Profile    Sabrina Douglas is a 78 y.o. female with a history of hypertension, hyperlipidemia, anxiety/depression, and recently diagnosed ILD. She is being seen today for the evaluation of new onset atrial fibrillation with RVR.  History of Present Illness   She states that today she began feeling more short of breath so EMS was called who found her in atrial fibrillation with RVR, rates of 160s. She was reportedly borderline hypotensive at that time (around 90 systolic). ECG also showed a RBBB not present on most recent ECGs from February but interestingly was present on ECGs from January when she was also seen here in the ED for shortness of breath (Worse with exertion. Was mildly tachycardic, O2 sats mid 90s, CTA negative for PE but with findings of fibrotic ILD. Discharged home). She was given adenosine with reportedly long pause and then back into AF with RVR, so she was given metoprolol 5mg  IV with improvement in her rates. However, in the ED automated cuff pressures have been downtrending and now reading 70s/50s. She has been given amiodarone bolus and is on 1mg /min with rates around 100-110. Manual pressure was 90 systolic. She now feels better with minimal dyspnea, no chest pain, no dizziness, alert and conversant. Troponin is negative.   Of note, she was recently admitted 2/26-2/28 with severe weakness and found to have hyponatremia at 114, dysphagia and about 20 pounds weight loss in the months. 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 She had a VQ scan that was negative. PFTs showed a severe diffusion defect as well as HRCT previously on 2/3 that showed clear evidence of ILD. She followed up with Dr. Gala Romney and was found to have exertional hypoxemia with sats down to low 80s. She underwent RHC yesterday that actually revealed normal PA/RV pressures and high cardiac output with high PA and SVC sats. She is scheduled to follow up with pulmonary for her ILD. At the time of her RHC yesterday she was normotensive. She has been on metoprolol tartrate 25mg  BID for her blood pressure and says she was told she could stop it for now.   Past Medical History   Past Medical History:  Diagnosis Date  . Anxiety   . Depression   . Hyperlipidemia     Past Surgical History:  Procedure Laterality Date  . BALLOON DILATION N/A 10/04/2018   Procedure: BALLOON DILATION;  Surgeon: Kathi Der, MD;  Location: [facility];  Service: Gastroenterology;  Laterality: N/A;  . BIOPSY  10/04/2018   Procedure: BIOPSY;  Surgeon: Kathi Der, MD;  Location: [facility];  Service: Gastroenterology;;  . ESOPHAGOGASTRODUODENOSCOPY (EGD) WITH PROPOFOL N/A 10/04/2018   Procedure: ESOPHAGOGASTRODUODENOSCOPY (EGD) WITH PROPOFOL;  Surgeon: Kathi Der, MD;  Location: [facility];  Service: Gastroenterology;  Laterality: N/A;  . RIGHT HEART CATH N/A 10/15/2018   Procedure: RIGHT HEART CATH;  Surgeon: Dolores Patty, MD;  Location: [facility];  Service: Cardiovascular;  Laterality: N/A;     No Known Allergies Inpatient Medications      Family History    Family History  Problem Relation Age of Onset  . Sudden death Mother 23       sudden death post hysterectomy  . Prostate cancer Father   . Diabetes Father    She indicated that her mother is deceased. She indicated that her father is deceased.   Social History    Social History   Socioeconomic History  . Marital status: Widowed    Spouse name: Not on file   . Number of children: Not on file  . Years of education: Not on file  . Highest education level: Not on file  Occupational History  . Not on file  Social Needs  . Financial resource strain: Not on file  . Food insecurity:    Worry: Not on file    Inability: Not on file  . Transportation needs:    Medical: Not on file    Non-medical: Not on file  Tobacco Use  . Smoking status: Never Smoker  . Smokeless tobacco: Never Used  Substance and Sexual Activity  . Alcohol use: Never    Frequency: Never  . Drug use: Not on file  . Sexual activity: Not on file  Lifestyle  . Physical activity:    Days per week: Not on file    Minutes per session: Not on file  . Stress: Not on file  Relationships  . Social connections:    Talks on phone: Not on file    Gets together: Not on file    Attends religious service: Not on file    Active member of club or organization: Not on file    Attends meetings of clubs or organizations: Not on file    Relationship status: Not on file  . Intimate partner violence:    Fear of current or ex partner: Not on file    Emotionally abused: Not on file    Physically abused: Not on file    Forced sexual activity: Not on file  Other Topics Concern  . Not on file  Social History Narrative  . Not on file     Review of Systems    General:  +weight loss per HPI, + weakness. No chills, fever, night sweats. Cardiovascular:  +dyspnea. No chest pain, edema, orthopnea, palpitations, paroxysmal nocturnal dyspnea. Dermatological: No rash, lesions/masses Respiratory: No cough currently Urologic: No hematuria, dysuria Abdominal:   No nausea, vomiting, diarrhea, bright red blood per rectum, melena, or hematemesis Neurologic:  No visual changes, wkns, changes in mental status. All other systems reviewed and are otherwise negative except as noted above.  Physical Exam    Blood pressure (!) 80/65, pulse (!) 106, temperature (!) 97 F (36.1 C), temperature source  Temporal, resp. rate (!) 22, SpO2 98 %.    No intake or output data in the 24 hours ending 10/16/18 0448 Wt Readings from Last 3 Encounters:  10/15/18 63.5 kg  10/11/18 64.6 kg  10/10/18 64.7 kg    CONSTITUTIONAL: alert and conversant, comfortable appearing, no distress HEENT: normal NECK: no cervical adenopathy, no thyromegaly CARDIOVASCULAR: Irregular rhythm. No gallop, murmur, or rub. Normal S1/S2. Radial pulses intact. No JVD. No carotid bruits. PULMONARY/CHEST WALL: no deformities, crackles at lung bases, normal work of breathing ABDOMINAL: soft, non-tender, non-distended EXTREMITIES: no edema or muscle atrophy, warm and well-perfused SKIN: Dry and intact without apparent rashes or wounds. NEUROLOGIC: alert, normal gait, no abnormal movements, cranial nerves grossly intact.   Labs    Troponin [facility] of Care Test) Recent Labs    10/16/18 [phone]  TROPIPOC 0.07   No results for input(s): CKTOTAL, CKMB, TROPONINI in the last 72 hours. Lab Results  Component Value Date   WBC 3.7 (L) 10/11/2018   HGB 12.6 10/15/2018   HCT 37.0 10/15/2018   MCV 94.7 10/11/2018   PLT 385 10/11/2018    Recent Labs  Lab 10/16/18 0401  NA 139  K 3.2*  CL 99  CO2 27  BUN 13  CREATININE 0.87  CALCIUM 8.8*  GLUCOSE 156*   No results found for: CHOL, HDL, LDLCALC, TRIG No results found for: [facility]   Radiology Studies    Dg Chest 2 View  Result Date: 09/29/2018 CLINICAL DATA:  Shortness of breath. EXAM: CHEST - 2 VIEW COMPARISON:  Chest x-ray dated September 04, 2018. FINDINGS: The heart size and mediastinal contours are within normal limits. Normal pulmonary vascularity. Unchanged interstitial opacities at the left greater than right lung bases. No focal consolidation, pleural effusion, or pneumothorax. No acute osseous abnormality. Distended stomach. IMPRESSION: 1. Unchanged bibasilar interstitial lung disease.  No acute finding. Electronically Signed   By: Obie Dredge M.D.   On:  09/29/2018 14:46   Nm Pulmonary Perf And Vent  Result Date: 10/03/2018 CLINICAL DATA:  Shortness of Breath EXAM: NUCLEAR MEDICINE VENTILATION - PERFUSION LUNG SCAN VIEWS: Anterior, posterior, left lateral, right lateral, RPO, LPO, RAO, LAO-ventilation and perfusion RADIOPHARMACEUTICALS:  30.6 mCi of Tc-3m DTPA aerosol inhalation and 4.4 mCi Tc52m MAA IV COMPARISON:  Chest radiograph September 29, 2018 FINDINGS: Ventilation: Radiotracer uptake is homogeneous and symmetric bilaterally. No ventilation defects are evident. Perfusion: Radiotracer uptake is homogeneous and symmetric bilaterally. No perfusion defects are evident. IMPRESSION: No appreciable ventilation or perfusion defects. Very low probability of pulmonary embolus. Electronically Signed   By: Bretta Bang III M.D.   On: 10/03/2018 14:01   Dg Chest Portable 1 View  Result Date: 10/16/2018 CLINICAL DATA:  Shortness of breath EXAM: PORTABLE CHEST 1 VIEW COMPARISON:  09/29/2018, 09/04/2018, 06/15/2018 FINDINGS: Bibasilar fibrosis. No acute consolidation or pleural effusion. Stable borderline cardiomegaly. No pneumothorax. IMPRESSION: No active disease.  Stable bibasilar fibrosis and/or scarring Electronically Signed   By: Jasmine Pang M.D.   On: 10/16/2018 03:28   Dg Esophagus W Single Cm (sol Or Thin Ba)  Result Date: 10/01/2018 CLINICAL DATA:  Dysphagia. EXAM: ESOPHOGRAM/BARIUM SWALLOW TECHNIQUE: Single contrast examination was performed using thin barium or water soluble. FLUOROSCOPY TIME:  Fluoroscopy Time:  1 minutes 36 seconds Radiation Exposure Index (if provided by the fluoroscopic device): 16.5 Number of Acquired Spot Images: 0 COMPARISON:  None. FINDINGS: The study is limited due to patient condition. The patient could not fully stand and could not drink quickly. There is altered peristalsis with slow transit of the barium through the esophagus. There is smooth narrowing of the distal esophagus throughout the study is which never  completely opens up. No obvious mass. Limited views in the mucosa are unremarkable. Limited views of the stomach are normal. The patient could not tolerate swelling and tablet. IMPRESSION: 1. There is smooth narrowing of the distal esophagus which never opens up. The findings are suspicious or a benign stricture/narrowing, resulting in the patient's symptoms. Peristalsis is considered less likely as the finding is seen on multiple occasions and the distal esophagus did not open up despite multiple attempts. However, the patient could not tolerate a tablet for confirmation of a distal stricture. Electronically Signed   By: Gerome Sam III M.D   On: 10/01/2018 14:45    ECG & Cardiac Imaging  Echo 09/30/18 1. The left ventricle has hyperdynamic systolic function, with an ejection fraction of >65%. The cavity size was normal. Left ventricular diastolic parameters were normal No evidence of left ventricular regional wall motion abnormalities. 2. No evidence of left ventricular regional wall motion abnormalities. 3. The right ventricle has normal systolic function. The cavity was grossly normal. RA-RV peak gradient increased at 66.9 mmHg consistent with probable severe pulmonary hypertension. Could not estimate CVP however. 4. The aortic valve is tricuspid Mild aortic annular calcification noted. 5. The mitral valve is normal in structure. 6. The tricuspid valve is normal in structure. Tricuspid valve regurgitation is   PFTs  (10/02/18) FEV1 1.13 (66%) FVC   1.23 (56%) DLCO 16%  VQ scan 10/03/18: Negative   ECG (today): atrial fibrillation with RVR and RBBB - personally reviewed.  Assessment & Plan  This is a 78 year old woman with hypertension, hyperlipidemia, and recently diagnosed ILD with exertional hypoxemia, presenting with dyspnea in the setting of new onset AF with RVR, otherwise with no prior cardiac history. Onset of AF is within 24 hours so rhythm control would be an option if  needed. For now though, she is rate controlled. Though there may be some discrepancy between manual and cuff pressures, she seems to be at the very least borderline hypotensive, not accounted for at this point by her atrial fibrillation given normal rates. She could probably at least tolerate continuation of amiodarone infusion. CHADS2-VASc is 4, so she does warrant anticoagulation.   Atrial fibrillation with RVR: - Continue amiodarone infusion for now - Keep NPO for potential cardioversion today if needed - Agree with starting heparin - Agree with CTA to rule out PE  We will continue to follow. Thank you for involving [place] in this patient's care.  Signed, Clerance Lav, MD 10/16/2018, 4:48 AM  For questions or updates, please contact   Please consult [email or web address] for contact info under Cardiology/STEMI.

## 2018-10-16 NOTE — ED Notes (Signed)
Admitting critical care NP at bedside evaluating pt.

## 2018-10-16 NOTE — Progress Notes (Signed)
Patient was reevaluated still waiting for inpatient bed in the ER.  Patient ultimately converted to sinus rhythm.  Amiodarone drip was discontinued.  Heparin was discontinued.  She is started on Eliquis by cardiology. Plan: Due to patient's rapid improvement of symptoms, will change to observation status. She will go to medical telemetry bed. Advance activities, continue on telemetry Walk with assistance, will check need for supplemental oxygen. Anticipate discharge home tomorrow if she remains in sinus rhythm and remains stable.

## 2018-10-16 NOTE — H&P (Signed)
History and Physical    Sabrina Douglas WUJ:[id number] DOB: [birth year] DOA: 10/16/2018  PCP: Philip Aspen, Limmie Patricia, MD  Patient coming from: Home.  I have personally briefly reviewed patient's old medical records available.   Chief Complaint: Shortness of breath, hypotensive on arrival.  HPI: Sabrina Douglas is a 78 y.o. female with medical history significant of interstitial lung disease that was recently diagnosed, anxiety, depression, hyperlipidemia, hyponatremia, recent extensive hospitalizations, diagnostic right heart cath on 10/15/2018 presenting to the hospital with acute onset of shortness of breath.  According to the patient, she has progressive shortness of breath, she is mostly housebound.  She has been getting work-up for interstitial lung disease and underwent right heart cath yesterday.  She was recently in the hospital at [facility] long with hyponatremia as low as 114 and now currently remains on demeclocycline.  She went home last night from the hospital.  Early morning, she was getting around in the house, she felt very short of breath, told her daughter and they called EMS to bring patient to the ER. Her shortness of breath is usually improved at home with rest, this time it did not improve.  She also felt some palpitations and became very anxious.  No fever or chills.  Denies any chest pain or wheezing. Patient has chronic lower extremity edema left more than right. ED Course: EMS noted rapid A. fib with heart rate 160, she was given metoprolol IV,Adenosine with prolonged pause, and brought to the ER.  In the emergency room initial blood pressures were 70s/50s.  She was given 2 L of normal saline, loaded with amiodarone and put on maintenance amiodarone.  She was also started on heparin. After 2 L of saline boluses and rate control with amiodarone, patient currently improved.  Blood pressures improved.  She is mentating well.  Patient was seen by cardiology and PCCM in  the ER. CTA shows ILD changes.  No PE.  Review of Systems: As per HPI otherwise 10 point review of systems negative.    Past Medical History:  Diagnosis Date  . Anxiety   . Depression   . Hyperlipidemia     Past Surgical History:  Procedure Laterality Date  . BALLOON DILATION N/A 10/04/2018   Procedure: BALLOON DILATION;  Surgeon: Kathi Der, MD;  Location: [facility];  Service: Gastroenterology;  Laterality: N/A;  . BIOPSY  10/04/2018   Procedure: BIOPSY;  Surgeon: Kathi Der, MD;  Location: [facility];  Service: Gastroenterology;;  . ESOPHAGOGASTRODUODENOSCOPY (EGD) WITH PROPOFOL N/A 10/04/2018   Procedure: ESOPHAGOGASTRODUODENOSCOPY (EGD) WITH PROPOFOL;  Surgeon: Kathi Der, MD;  Location: [facility];  Service: Gastroenterology;  Laterality: N/A;  . RIGHT HEART CATH N/A 10/15/2018   Procedure: RIGHT HEART CATH;  Surgeon: Dolores Patty, MD;  Location: [facility];  Service: Cardiovascular;  Laterality: N/A;     reports that she has never smoked. She has never used smokeless tobacco. She reports that she does not drink alcohol. No history on file for drug.  No Known Allergies  Family History  Problem Relation Age of Onset  . Sudden death Mother 71       sudden death post hysterectomy  . Prostate cancer Father   . Diabetes Father      Prior to Admission medications   Medication Sig Start Date End Date Taking? Authorizing Provider  ALPRAZolam (XANAX) 0.25 MG tablet Take 0.125-0.25 mg by mouth 2 (two) times daily as needed for anxiety.    Yes [provider]  demeclocycline (DECLOMYCIN) 150 MG tablet Take 1 tablet (150 mg total) by mouth 2 (two) times daily. 10/05/18  Yes Rai, Ripudeep K, MD  furosemide (LASIX) 40 MG tablet Take 1 tablet (40 mg total) by mouth daily. 10/06/18  Yes Rai, Ripudeep K, MD  Glycerin-Hypromellose-PEG 400 ([facility] DRY EYE RELIEF) 0.2-0.2-1 % SOLN Place 2 drops into both eyes as needed (Dry eyes).   Yes [provider]  metoprolol tartrate (LOPRESSOR) 25 MG tablet Take 1 tablet (25 mg total) by mouth 2 (two) times daily. 10/10/18  Yes Philip Aspen, Limmie Patricia, MD  Multiple Vitamin (MULTIVITAMIN WITH MINERALS) TABS tablet Take 1 tablet by mouth daily.   Yes [provider]  pantoprazole (PROTONIX) 40 MG tablet Take 40 mg by mouth daily.   Yes [provider]  albuterol (PROVENTIL HFA;VENTOLIN HFA) 108 (90 Base) MCG/ACT inhaler Inhale 2 puffs into the lungs every 6 (six) hours as needed for wheezing or shortness of breath. 09/18/18   Glenford Bayley, NP  Respiratory Therapy Supplies (FLUTTER) DEVI 1 Device by Does not apply route as needed. 09/19/18   Glenford Bayley, NP  simvastatin (ZOCOR) 20 MG tablet Take 1 tablet (20 mg total) by mouth daily. Hold until follow-up with PCP and labs 10/05/18   Rai, Delene Ruffini, MD  spironolactone (ALDACTONE) 25 MG tablet Take 25 mg by mouth daily.    [provider]    Physical Exam: Vitals:   10/16/18 0700 10/16/18 0715 10/16/18 0730 10/16/18 0745  BP: 98/83 96/73 95/61  (!) 89/70  Pulse: (!) 102 (!) 102 (!) 103   Resp: (!) 36 18 (!) 21   Temp:      TempSrc:      SpO2: 98% 98% 98% 99%  Weight:      Height:        Constitutional: NAD, calm, comfortable Vitals:   10/16/18 0700 10/16/18 0715 10/16/18 0730 10/16/18 0745  BP: 98/83 96/73 95/61  (!) 89/70  Pulse: (!) 102 (!) 102 (!) 103   Resp: (!) 36 18 (!) 21   Temp:      TempSrc:      SpO2: 98% 98% 98% 99%  Weight:      Height:       Eyes: PERRL, lids and conjunctivae normal Chronically sick looking.  Currently on room air.  She looks comfortable.  She could finish sentences. ENMT: Mucous membranes are moist. Posterior pharynx clear of any exudate or lesions.Normal dentition.  Neck: normal, supple, no masses, no thyromegaly Respiratory: Occasional bilateral expiratory crackles, no added sounds.  Normal respiratory effort. No accessory muscle use at  rest. Cardiovascular: Irregular rate and rhythm, no murmurs / rubs / gallops.  1+ extremity edema, left more than right.  2+ pedal pulses. No carotid bruits.  Abdomen: no tenderness, no masses palpated. No hepatosplenomegaly. Bowel sounds positive.  Musculoskeletal: no clubbing / cyanosis. No joint deformity upper and lower extremities. Good ROM, no contractures. Normal muscle tone.  Skin: no rashes, lesions, ulcers. No induration Neurologic: CN 2-12 grossly intact. Sensation intact, DTR normal. Strength 5/5 in all 4.  Psychiatric: Normal judgment and insight. Alert and oriented x 3. Normal mood.     Labs on Admission: I have personally reviewed following labs and imaging studies  CBC: Recent Labs  Lab 10/11/18 1645 10/15/18 0712 10/15/18 0717 10/15/18 0719 10/15/18 0723 10/16/18 0534  WBC 3.7*  --   --   --   --  3.7*  NEUTROABS  --   --   --   --   --  2.8  HGB 14.3 12.9  12.9 12.6 12.9 12.6 12.0  HCT 44.5 38.0  38.0 37.0 38.0 37.0 37.5  MCV 94.7  --   --   --   --  97.4  PLT 385  --   --   --   --  261   Basic Metabolic Panel: Recent Labs  Lab 10/10/18 1651 10/15/18 0712 10/15/18 0717 10/15/18 0719 10/15/18 0723 10/16/18 0401 10/16/18 0618  NA 136 141  140 141 141 141 139  --   K 4.1 3.2*  3.3* 3.3* 3.2* 3.2* 3.2*  --   CL 91*  --   --   --   --  99  --   CO2 38*  --   --   --   --  27  --   GLUCOSE 97  --   --   --   --  156*  --   BUN 12  --   --   --   --  13  --   CREATININE 0.68  --   --   --   --  0.87  --   CALCIUM 10.1  --   --   --   --  8.8*  --   MG  --   --   --   --   --   --  1.7   GFR: Estimated Creatinine Clearance: 47.2 mL/min (by C-G formula based on SCr of 0.87 mg/dL). Liver Function Tests: No results for input(s): AST, ALT, ALKPHOS, BILITOT, PROT, ALBUMIN in the last 168 hours. No results for input(s): LIPASE, AMYLASE in the last 168 hours. No results for input(s): AMMONIA in the last 168 hours. Coagulation Profile: Recent Labs  Lab  10/11/18 1645  INR 1.1   Cardiac Enzymes: No results for input(s): CKTOTAL, CKMB, CKMBINDEX, TROPONINI in the last 168 hours. BNP (last 3 results) No results for input(s): PROBNP in the last 8760 hours. HbA1C: No results for input(s): HGBA1C in the last 72 hours. CBG: No results for input(s): GLUCAP in the last 168 hours. Lipid Profile: No results for input(s): CHOL, HDL, LDLCALC, TRIG, CHOLHDL, LDLDIRECT in the last 72 hours. Thyroid Function Tests: No results for input(s): TSH, T4TOTAL, FREET4, T3FREE, THYROIDAB in the last 72 hours. Anemia Panel: No results for input(s): VITAMINB12, FOLATE, FERRITIN, TIBC, IRON, RETICCTPCT in the last 72 hours. Urine analysis:    Component Value Date/Time   COLORURINE YELLOW 09/29/2018 1434   APPEARANCEUR CLEAR 09/29/2018 1434   LABSPEC 1.017 09/29/2018 1434   PHURINE 7.0 09/29/2018 1434   GLUCOSEU 50 (A) 09/29/2018 1434   HGBUR NEGATIVE 09/29/2018 1434   BILIRUBINUR NEGATIVE 09/29/2018 1434   KETONESUR 20 (A) 09/29/2018 1434   PROTEINUR NEGATIVE 09/29/2018 1434   NITRITE NEGATIVE 09/29/2018 1434   LEUKOCYTESUR TRACE (A) 09/29/2018 1434    Radiological Exams on Admission: Ct Angio Chest Pe W And/or Wo Contrast  Result Date: 10/16/2018 CLINICAL DATA:  Shortness of breath EXAM: CT ANGIOGRAPHY CHEST WITH CONTRAST TECHNIQUE: Multidetector CT imaging of the chest was performed using the standard protocol during bolus administration of intravenous contrast. Multiplanar CT image reconstructions and MIPs were obtained to evaluate the vascular anatomy. CONTRAST:  3mL OMNIPAQUE 350 COMPARISON:  Prior CTA from 09/04/2018 FINDINGS: Cardiovascular: Thoracic aorta shows no evidence of aneurysmal dilatation or dissection although opacifications somewhat limited due to the timing of the contrast bolus. The pulmonary artery shows no focal filling defect to suggest pulmonary embolism. Opacification of the inferior  most lower lobe branches bilaterally is  limited due to the timing of the imaging with relation to the contrast bolus. This is symmetrical in nature and likely related to the underlying fibrotic change. No significant coronary calcifications are noted at this time. Mediastinum/Nodes: Thoracic inlet demonstrates the right thyroid lobe to be somewhat prominent but stable from the prior exam. No significant hilar or mediastinal adenopathy is noted. The visualized esophagus is within normal limits. Lungs/Pleura: Lungs are well aerated bilaterally. Stable nodule along the major fissure on the left is seen best noted on image number 27 of series 6. It is stable in appearance. Stable scarring is noted in the lingula. Persistent lower lobe volume loss with peribronchial thickening and associated ground-glass opacity is noted stable from the previous exam. No new focal infiltrate is seen. No new nodular changes are noted. Upper Abdomen: Visualized upper abdomen demonstrates a left renal cyst stable from the prior exam. Musculoskeletal: Mild degenerative changes of the thoracic spine are seen. No acute bony abnormality is noted. Review of the MIP images confirms the above findings. IMPRESSION: Chronic changes in the bases bilaterally. No evidence of pulmonary embolism. Evaluation of the inferior aspect of the lower lobes bilaterally is somewhat limited due to the chronic changes in the bases as well as the timing of the contrast bolus. Stable nodule along the major fissure on the left. No follow-up needed if patient is low-risk. Non-contrast chest CT can be considered in 12 months if patient is high-risk. This recommendation follows the consensus statement: Guidelines for Management of Incidental Pulmonary Nodules Detected on CT Images: From the Fleischner Society 2017; Radiology 2017; 284:228-243. Electronically Signed   By: Alcide Clever M.D.   On: 10/16/2018 05:25   Dg Chest Portable 1 View  Result Date: 10/16/2018 CLINICAL DATA:  Shortness of breath EXAM:  PORTABLE CHEST 1 VIEW COMPARISON:  09/29/2018, 09/04/2018, 06/15/2018 FINDINGS: Bibasilar fibrosis. No acute consolidation or pleural effusion. Stable borderline cardiomegaly. No pneumothorax. IMPRESSION: No active disease.  Stable bibasilar fibrosis and/or scarring Electronically Signed   By: Jasmine Pang M.D.   On: 10/16/2018 03:28    EKG: Independently reviewed.  A. fib with RVR.  Assessment/Plan Principal Problem:   Atrial fibrillation with RVR (HCC) Active Problems:   ILD (interstitial lung disease) (HCC)   Hyponatremia   Essential hypertension   Lower extremity edema   Dysphagia   Malnutrition of moderate degree   Pulmonary hypertension, unspecified (HCC)   Hypotension (arterial)     1.Hypotension: Probably related to A. fib along with medication induced hypotension.  No evidence of sepsis.  Stabilized with fluid boluses.  Agree with admission to stepdown unit and continue monitoring. Will hold antihypertensive medications including metoprolol.  2.  New onset A. fib with RVR: Currently stabilized on amiodarone bolus and then amiodarone infusion.  Blood pressures not adequate for other rate control medications including Cardizem and metoprolol.  Normal ejection fraction on echocardiogram done last week.  Normal right sided heart cath on 10/15/2018.  No previous history of A. Fib. Continue amiodarone as this is the only medication of choice today given hypotension, patient has interstitial lung disease, she is not candidate for long-term amiodarone.  She may need cardioversion.  We will continue amiodarone per symptom control, continue heparin infusion for anticoagulation. Patient will be seen by cardiology. We will keep n.p.o. in case patient needs electrical cardioversion.  3.  Advanced interstitial lung disease: Followed by pulmonology.  Work-up in progress.  Continue bronchodilators. Levo-albuterol to avoid tachycardia.  4.  Hyponatremia: Improved now.  She is on maintenance  demeclocycline that she will continue.  5.  Hypokalemia: We will replace potassium to keep potassium level more than 4.  Also replace magnesium to keep more than 2.  Recheck potassium and magnesium with phosphorus in the morning.  This patient has severe medical problem, she has rapid A. fib and intolerance to various medications, needing IV infusion of high risk medications including amiodarone and heparin.  High risk of decompensation without inpatient hospitalization.  Anticipate hospital stay for more than 2 midnights.  DVT prophylaxis: Heparin infusion Code Status: Full code Family Communication: Daughter at bedside Disposition Plan: Home with home health care Consults called: PCCM, cardiology Admission status: Inpatient, stepdown   Dorcas CarrowKuber Henryetta Corriveau MD [facility] Pager [phone]  If 7PM-7AM, please contact night-coverage [email or web address] Password [email or web address]  10/16/2018, 7:58 AM

## 2018-10-16 NOTE — ED Notes (Signed)
Dr. Wilkie Aye ( cardiologist) at bedside evaluating pt.

## 2018-10-16 NOTE — ED Notes (Addendum)
EDP notified on persistent hypotension despite 2 liter NS IV bolus .

## 2018-10-16 NOTE — ED Notes (Signed)
CT scan tech notified on EDP's request to expedite CT scan of pt.

## 2018-10-16 NOTE — ED Triage Notes (Signed)
Patient arrived with EMS from home reports SOB this evening had cardiac catheterization yesterday , received ASA 324 mg po , NS IV bolus , Adenosine 6 mg/Metropolol 5 mg IV for HR=160-180 prior to arrival , HR= 128/min at arrival , denies chest pain , breathing improved .

## 2018-10-16 NOTE — ED Notes (Signed)
Pt now noted to be in NSR with right BBB. EKG captured. HR in the 60s now. BP normal. Pt axox4. Has no complaints.

## 2018-10-16 NOTE — ED Notes (Signed)
Pt used bedside commode, could not obtain urinalysis due to pt having small bowel movement while urinating.

## 2018-10-16 NOTE — Progress Notes (Signed)
ANTICOAGULATION CONSULT NOTE - Initial Consult  Pharmacy Consult for heparin Indication: atrial fibrillation  No Known Allergies  Patient Measurements: Height: 5\' 5"  (165.1 cm) Weight: 139 lb 15.9 oz (63.5 kg) IBW/kg (Calculated) : 57  Vital Signs: Temp: 97 F (36.1 C) (03/10 0317) Temp Source: Temporal (03/10 0317) BP: 80/65 (03/10 0445) Pulse Rate: 106 (03/10 0445)  Labs: Recent Labs    10/15/18 0717 10/15/18 0719 10/15/18 0723 10/16/18 0401  HGB 12.6 12.9 12.6  --   HCT 37.0 38.0 37.0  --   CREATININE  --   --   --  0.87    Estimated Creatinine Clearance: 47.2 mL/min (by C-G formula based on SCr of 0.87 mg/dL).   Medical History: Past Medical History:  Diagnosis Date  . Anxiety   . Depression   . Hyperlipidemia     Assessment: 78yo female c/o SOB, found to be in new-onset Afib w/ RVR, CT chest negative for PE, to begin heparin.  Goal of Therapy:  Heparin level 0.3-0.7 units/ml Monitor platelets by anticoagulation protocol: Yes   Plan:  Will give heparin 3000 units IV bolus x1 followed by gtt at 900 units/hr and monitor heparin levels and CBC.  Vernard Gambles, PharmD, BCPS  10/16/2018,5:31 AM

## 2018-10-16 NOTE — Consult Note (Signed)
NAME:  Sabrina Douglas, MRN:  [id number], DOB:  1940-10-10, LOS: 0 ADMISSION DATE:  10/16/2018, CONSULTATION DATE: 3/10 REFERRING MD:  Dr Nicanor Alcon, CHIEF COMPLAINT:  hypotension  Brief History   78 year old female with recent diagnosis of ILD followed by Dr. Vassie Loll.  Presented 3/10 with new onset atrial fibrillation with RVR.  Despite treatment, she remained in A. fib and developed hypotension.  History of present illness   78 year old female with past medical history as below, which is significant for hypertension, hyperlipidemia, and recent diagnosis of ILD followed by Dr. Vassie Loll.  Recent complicated course started back in October of last year where she was admitted for pneumonia.  He did not improve as expected and followed up with pulmonary for CT was concerning for fibrotic lung disease.  Serologies were grossly abnormal with no significant diagnosis made at that time.  High-res CT was concerning for fibrotic lung disease.  PFTs were concerning for diffusion defect not fully described by the imaging she was sent for right heart cath on 3/9 with normal pulmonary pressures.  In the early a.m. hours of 3/10 she developed acute onset shortness of breath which did not improve with rest as he normally did.  She called EMS with these complaints.  Upon their arrival she was found to be in atrial fibrillation with rapid ventricular response and rates up to the 180s.  She was given adenosine with extended pause and return to A. fib RVR.  Metoprolol was administered with only temporary improvement in her rate and subsequent hypotension.  She was treated with intravenous amiodarone with a modest improvement in her heart rate to the low 100s.  Despite IV fluid she remained hypotensive.  PCCM was asked to admit.  Past Medical History   has a past medical history of Anxiety, Depression, and Hyperlipidemia.  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 VQ 2/26 > No appreciable ventilation or perfusion defects. Very low probability of pulmonary embolus. CTA chest 3/10 > Chronic changes in the bases bilaterally. No evidence of pulmonary embolism. Stable nodule along the major fissure on the left.  Micro Data:    Antimicrobials:     Interim history/subjective:    Objective   Blood pressure (!) 68/54, pulse (!) 116, temperature (!) 97 F (36.1 C), temperature source Temporal, resp. rate 16, height 5\' 5"  (1.651 m), weight 63.5 kg, SpO2 99 %.        Intake/Output Summary (Last 24 hours) at 10/16/2018 0631 Last data filed at 10/16/2018 0541 Gross per 24 hour  Intake 2000 ml  Output -  Net 2000 ml   Filed Weights   10/16/18 0508  Weight: 63.5 kg    Examination: General: Elderly female in NAD HENT: [place]/AT, no JVD Lungs: Diminished bases Cardiovascular: IRIR, tachy, no MRG. Trace edema.  Abdomen: Soft, non-tender, non-distended Extremities: No acute deformity.  Neuro: Alert, oriented, non-focal   Resolved Hospital Problem list     Assessment & Plan:   Hypotension: etiology unclear. No history concerning for sepsis of cardiogenic source. Felt to be due to RVR, but did not improve much with moderate rate control. Perhaps remains hypotensive from IV metoprolol given by EMS. Hypovolemia also a concern. Have changed BP cuff to smaller size and BP  now improved and more consistent with her mental status. MAP in 70s. RF 55 - Telemetry monitoring - No need for ICU at this time - Gentle IVF  Interstitial lung disease: serologies tested in office and are as follows. (ANA positive, CCP neg, ANA Titer positive, SSA, SSB negative, RNP  Ab pos, anti smith pos, anti SCL neg, ANCA neg) - Supplemental O2 as  indicated to maintain SpO2 > 92% - Was to see Dr. Vassie LollAlva this week for next steps, hopefully we can get her re-arranged for outpatient follow up.   Atrial fibrillation with RVR - Amiodarone infusion for now. Less than ideal with ILD - Per cardiology   Best practice:  Diet: per primary Pain/Anxiety/Delirium protocol (if indicated): na VAP protocol (if indicated): na DVT prophylaxis: per primary GI prophylaxis: Per priamry Glucose control:  Mobility: BR Family Communication: Daughters updated. Disposition: Telemetry  Labs   CBC: Recent Labs  Lab 10/11/18 1645 10/15/18 0712 10/15/18 0717 10/15/18 0719 10/15/18 0723 10/16/18 0534  WBC 3.7*  --   --   --   --  3.7*  NEUTROABS  --   --   --   --   --  PENDING  HGB 14.3 12.9  12.9 12.6 12.9 12.6 12.0  HCT 44.5 38.0  38.0 37.0 38.0 37.0 37.5  MCV 94.7  --   --   --   --  97.4  PLT 385  --   --   --   --  261    Basic Metabolic Panel: Recent Labs  Lab 10/10/18 1651 10/15/18 0712 10/15/18 0717 10/15/18 0719 10/15/18 0723 10/16/18 0401  NA 136 141  140 141 141 141 139  K 4.1 3.2*  3.3* 3.3* 3.2* 3.2* 3.2*  CL 91*  --   --   --   --  99  CO2 38*  --   --   --   --  27  GLUCOSE 97  --   --   --   --  156*  BUN 12  --   --   --   --  13  CREATININE 0.68  --   --   --   --  0.87  CALCIUM 10.1  --   --   --   --  8.8*   GFR: Estimated Creatinine Clearance: 47.2 mL/min (by C-G formula based on SCr of 0.87 mg/dL). Recent Labs  Lab 10/11/18 1645 10/16/18 0534  WBC 3.7* 3.7*    Liver Function Tests: No results for input(s): AST, ALT, ALKPHOS, BILITOT, PROT, ALBUMIN in the last 168 hours. No results for input(s): LIPASE, AMYLASE in the last 168 hours. No results for input(s): AMMONIA in the last 168 hours.  ABG    Component Value Date/Time   HCO3 36.1 (H) 10/15/2018 0723   TCO2 38 (H) 10/15/2018 0723   O2SAT 83.0 10/15/2018 0723     Coagulation Profile: Recent Labs  Lab 10/11/18 1645  INR 1.1     Cardiac Enzymes: No results for input(s): CKTOTAL, CKMB, CKMBINDEX, TROPONINI in the last 168 hours.  HbA1C: No results found for: HGBA1C  CBG: No results for input(s): GLUCAP in the last 168 hours.  Review of Systems:   Bolds are positive  Constitutional: weight loss, gain, night sweats, Fevers, chills, fatigue .  HEENT: headaches, Sore throat, sneezing, nasal congestion, post nasal drip, Difficulty swallowing, Tooth/dental problems, visual complaints visual changes, ear ache CV:  chest pain, radiates:,Orthopnea, PND, swelling in lower extremities, dizziness, palpitations,  syncope.  GI  heartburn, indigestion, abdominal pain, nausea, vomiting, diarrhea, change in bowel habits, loss of appetite, bloody stools.  Resp: cough, nonproductive:, hemoptysis, dyspnea, chest pain, pleuritic.  Skin: rash or itching or icterus GU: dysuria, change in color of urine, urgency or frequency. flank pain, hematuria  MS: joint pain or swelling. decreased range of motion  Psych: change in mood or affect. depression or anxiety.  Neuro: difficulty with speech, weakness, numbness, ataxia    Past Medical History  She,  has a past medical history of Anxiety, Depression, and Hyperlipidemia.   Surgical History    Past Surgical History:  Procedure Laterality Date  . BALLOON DILATION N/A 10/04/2018   Procedure: BALLOON DILATION;  Surgeon: Kathi Der, MD;  Location: [facility];  Service: Gastroenterology;  Laterality: N/A;  . BIOPSY  10/04/2018   Procedure: BIOPSY;  Surgeon: Kathi Der, MD;  Location: [facility];  Service: Gastroenterology;;  . ESOPHAGOGASTRODUODENOSCOPY (EGD) WITH PROPOFOL N/A 10/04/2018   Procedure: ESOPHAGOGASTRODUODENOSCOPY (EGD) WITH PROPOFOL;  Surgeon: Kathi Der, MD;  Location: [facility];  Service: Gastroenterology;  Laterality: N/A;  . RIGHT HEART CATH N/A 10/15/2018   Procedure: RIGHT HEART CATH;  Surgeon: Dolores Patty, MD;  Location: [facility];  Service: Cardiovascular;  Laterality: N/A;     Social History   reports that she has never smoked. She has never used smokeless tobacco. She reports that she does not drink alcohol.   Family History   Her family history includes Diabetes in her father; Prostate cancer in her father; Sudden death (age of onset: 66) in her mother.   Allergies No Known Allergies   Home Medications  Prior to Admission medications   Medication Sig Start Date End Date Taking? Authorizing Provider  ALPRAZolam (XANAX) 0.25 MG tablet Take 0.125-0.25 mg by mouth 2 (two) times daily as needed for anxiety.    Yes [provider]  demeclocycline (DECLOMYCIN) 150 MG tablet Take 1 tablet (150 mg total) by mouth 2 (two) times daily. 10/05/18  Yes Rai, Ripudeep K, MD  furosemide (LASIX) 40 MG tablet Take 1 tablet (40 mg total) by mouth daily. 10/06/18  Yes Rai, Ripudeep K, MD  Glycerin-Hypromellose-PEG 400 ([facility] DRY EYE RELIEF) 0.2-0.2-1 % SOLN Place 2 drops into both eyes as needed (Dry eyes).   Yes [provider]  metoprolol tartrate (LOPRESSOR) 25 MG tablet Take 1 tablet (25 mg total) by mouth 2 (two) times daily. 10/10/18  Yes Philip Aspen, Limmie Patricia, MD  Multiple Vitamin (MULTIVITAMIN WITH MINERALS) TABS tablet Take 1 tablet by mouth daily.   Yes [provider]  pantoprazole (PROTONIX) 40 MG tablet Take 40 mg by mouth daily.   Yes [provider]  albuterol (PROVENTIL HFA;VENTOLIN HFA) 108 (90 Base) MCG/ACT inhaler Inhale 2 puffs into the lungs every 6 (six) hours as needed for wheezing or shortness of breath. 09/18/18   Glenford Bayley, NP  Respiratory Therapy Supplies (FLUTTER) DEVI 1 Device by Does not apply route as needed. 09/19/18   Glenford Bayley, NP  simvastatin (ZOCOR) 20 MG tablet Take 1 tablet (20 mg total) by mouth daily. Hold until follow-up with PCP and labs 10/05/18   Rai, Delene Ruffini, MD  spironolactone (ALDACTONE) 25 MG tablet Take 25 mg by mouth daily.     [provider]          Joneen Roach, AGACNP-BC [facility] Pulmonary/Critical Care Pager [phone] or [phone]  10/16/2018 7:47 AM

## 2018-10-16 NOTE — ED Notes (Signed)
Pt ambulated down the hallway and back with walker without difficulty, spo2 remained above 95%.

## 2018-10-16 NOTE — Progress Notes (Signed)
DAILY PROGRESS NOTE   Patient Name: Sabrina Douglas Date of Encounter: 10/16/2018 Cardiologist: No primary care provider on file.  Chief Complaint   Feels fine  Patient Profile   78 yo female with ILD and recent echo with EF >65%, normal biatrial size and concern for severe pulmonary hypertension, however right heart cath yesterday shows normal pulmonary pressure. Now admitted for new onset AF with RVR.  Subjective   Noted to have converted back to sinus rhythm.  She is on amiodarone and heparin.  It is felt that her cardioversion may have been related to amiodarone.  Objective   Vitals:   10/16/18 0745 10/16/18 0800 10/16/18 0830 10/16/18 0845  BP: (!) 91/68  Pulse:  88 96 (!) 106  Resp:  (!) 25 (!) 29 18  Temp:      TempSrc:      SpO2: 99% 97% 97% 96%  Weight:      Height:        Intake/Output Summary (Last 24 hours) at 10/16/2018 1610 Last data filed at 10/16/2018 9604 Gross per 24 hour  Intake 2360.49 ml  Output -  Net 2360.49 ml   Filed Weights   10/16/18 0508  Weight: 63.5 kg    Physical Exam   General appearance: alert and no distress Neck: no carotid bruit, no JVD and thyroid not enlarged, symmetric, no tenderness/mass/nodules Lungs: diminished breath sounds bilaterally and rales bilaterally Heart: regular rate and rhythm Abdomen: soft, non-tender; bowel sounds normal; no masses,  no organomegaly Extremities: extremities normal, atraumatic, no cyanosis or edema Pulses: 2+ and symmetric Skin: Skin color, texture, turgor normal. No rashes or lesions Neurologic: Grossly normal Psych: Pleasant  Inpatient Medications    Scheduled Meds:   Continuous Infusions: . amiodarone 30 mg/hr (10/16/18 0908)  . heparin 12 Units/kg/hr (10/16/18 0615)  . potassium chloride 10 mEq (10/16/18 0849)    PRN Meds: levalbuterol   Labs   Results for orders placed or performed during the hospital encounter of 10/16/18 (from the past 48  hour(s))  Basic metabolic panel     Status: Abnormal   Collection Time: 10/16/18  4:01 AM  Result Value Ref Range   Sodium 139 135 - 145 mmol/L   Potassium 3.2 (L) 3.5 - 5.1 mmol/L   Chloride 99 98 - 111 mmol/L   CO2 27 22 - 32 mmol/L   Glucose, Bld 156 (H) 70 - 99 mg/dL   BUN 13 8 - 23 mg/dL   Creatinine, Ser 5.40 0.44 - 1.00 mg/dL   Calcium 8.8 (L) 8.9 - 10.3 mg/dL   GFR calc non Af Amer >60 >60 mL/min   GFR calc Af Amer >60 >60 mL/min   Anion gap 13 5 - 15    Comment: Performed at [facility], [address], [place], [place] [address]  Brain natriuretic peptide     Status: None   Collection Time: 10/16/18  4:01 AM  Result Value Ref Range   B Natriuretic Peptide 38.4 0.0 - 100.0 pg/mL    Comment: Performed at [facility], [address], [place], [place] [address]  I-stat troponin, ED     Status: None   Collection Time: 10/16/18  4:20 AM  Result Value Ref Range   Troponin i, poc 0.07 0.00 - 0.08 ng/mL   Comment 3            Comment: Due to the release kinetics of cTnI, a negative result within the  first hours of the onset of symptoms does not rule out myocardial infarction with certainty. If myocardial infarction is still suspected, repeat the test at appropriate intervals.   CBC with Differential/Platelet     Status: Abnormal   Collection Time: 10/16/18  5:34 AM  Result Value Ref Range   WBC 3.7 (L) 4.0 - 10.5 K/uL   RBC 3.85 (L) 3.87 - 5.11 MIL/uL   Hemoglobin 12.0 12.0 - 15.0 g/dL   HCT 56.3 87.5 - 64.3 %   MCV 97.4 80.0 - 100.0 fL   MCH 31.2 26.0 - 34.0 pg   MCHC 32.0 30.0 - 36.0 g/dL   RDW 32.9 51.8 - 84.1 %   Platelets 261 150 - 400 K/uL   nRBC 0.0 0.0 - 0.2 %   Neutrophils Relative % 77 %   Neutro Abs 2.8 1.7 - 7.7 K/uL   Lymphocytes Relative 6 %   Lymphs Abs 0.2 (L) 0.7 - 4.0 K/uL   Monocytes Relative 9 %   Monocytes Absolute 0.3 0.1 - 1.0 K/uL   Eosinophils Relative 0 %   Eosinophils Absolute 0.0 0.0 - 0.5 K/uL   Basophils Relative 0 %     Basophils Absolute 0.0 0.0 - 0.1 K/uL   nRBC 0 0 /100 WBC   Myelocytes 2 %   Promyelocytes Relative 6 %   Abs Immature Granulocytes 0.30 (H) 0.00 - 0.07 K/uL    Comment: Performed at [facility], [address] [place], [place] [address]  Magnesium     Status: None   Collection Time: 10/16/18  6:18 AM  Result Value Ref Range   Magnesium 1.7 1.7 - 2.4 mg/dL    Comment: Performed at [facility], [address], [place], [place] [address]    ECG   Afib at 132, RBBB - Personally Reviewed  Telemetry   Sinus rhythm in the 60's - Personally Reviewed  Radiology    Ct Angio Chest Pe W And/or Wo Contrast  Result Date: 10/16/2018 CLINICAL DATA:  Shortness of breath EXAM: CT ANGIOGRAPHY CHEST WITH CONTRAST TECHNIQUE: Multidetector CT imaging of the chest was performed using the standard protocol during bolus administration of intravenous contrast. Multiplanar CT image reconstructions and MIPs were obtained to evaluate the vascular anatomy. CONTRAST:  31mL OMNIPAQUE 350 COMPARISON:  Prior CTA from 09/04/2018 FINDINGS: Cardiovascular: Thoracic aorta shows no evidence of aneurysmal dilatation or dissection although opacifications somewhat limited due to the timing of the contrast bolus. The pulmonary artery shows no focal filling defect to suggest pulmonary embolism. Opacification of the inferior most lower lobe branches bilaterally is limited due to the timing of the imaging with relation to the contrast bolus. This is symmetrical in nature and likely related to the underlying fibrotic change. No significant coronary calcifications are noted at this time. Mediastinum/Nodes: Thoracic inlet demonstrates the right thyroid lobe to be somewhat prominent but stable from the prior exam. No significant hilar or mediastinal adenopathy is noted. The visualized esophagus is within normal limits. Lungs/Pleura: Lungs are well aerated bilaterally. Stable nodule along the major fissure on the left is  seen best noted on image number 27 of series 6. It is stable in appearance. Stable scarring is noted in the lingula. Persistent lower lobe volume loss with peribronchial thickening and associated ground-glass opacity is noted stable from the previous exam. No new focal infiltrate is seen. No new nodular changes are noted. Upper Abdomen: Visualized upper abdomen demonstrates a left renal cyst stable from the prior  exam. Musculoskeletal: Mild degenerative changes of the thoracic spine are seen. No acute bony abnormality is noted. Review of the MIP images confirms the above findings. IMPRESSION: Chronic changes in the bases bilaterally. No evidence of pulmonary embolism. Evaluation of the inferior aspect of the lower lobes bilaterally is somewhat limited due to the chronic changes in the bases as well as the timing of the contrast bolus. Stable nodule along the major fissure on the left. No follow-up needed if patient is low-risk. Non-contrast chest CT can be considered in 12 months if patient is high-risk. This recommendation follows the consensus statement: Guidelines for Management of Incidental Pulmonary Nodules Detected on CT Images: From the Fleischner Society 2017; Radiology 2017; 284:228-243. Electronically Signed   By: Alcide Clever M.D.   On: 10/16/2018 05:25   Dg Chest Portable 1 View  Result Date: 10/16/2018 CLINICAL DATA:  Shortness of breath EXAM: PORTABLE CHEST 1 VIEW COMPARISON:  09/29/2018, 09/04/2018, 06/15/2018 FINDINGS: Bibasilar fibrosis. No acute consolidation or pleural effusion. Stable borderline cardiomegaly. No pneumothorax. IMPRESSION: No active disease.  Stable bibasilar fibrosis and/or scarring Electronically Signed   By: Jasmine Pang M.D.   On: 10/16/2018 03:28    Cardiac Studies   None  Assessment   1. Principal Problem: 2.   Atrial fibrillation with RVR (HCC) 3. Active Problems: 4.   ILD (interstitial lung disease) (HCC) 5.   Hyponatremia 6.   Essential  hypertension 7.   Lower extremity edema 8.   Dysphagia 9.   Malnutrition of moderate degree 10.   Pulmonary hypertension, unspecified (HCC) 11.   Hypotension (arterial) 12.   Plan   1. Patient converted overnight to sinus rhythm. Asymptomatic at this point. Will d/c amiodarone given history of ILD. Ok to stop heparin.  Her CHADSVASC score if 4 -would recommend Eliquis 5 mg twice daily.  This is simply because she was "chemically cardioverted", and we would generally recommend a minimum of 30 days anticoagulation after this.  I would also recommend a 30-day outpatient monitor to look for recurrence and follow-up in the afib clinic.  If the burden of A. fib remains low, she may not need long-term anticoagulation beyond 1 month.  Probably can be discharged home today.  We will arrange follow-up in the A. fib clinic.  Time Spent Directly with Patient:  I have spent a total of 25 minutes with the patient reviewing hospital notes, telemetry, EKGs, labs and examining the patient as well as establishing an assessment and plan that was discussed personally with the patient.  > 50% of time was spent in direct patient care.  Length of Stay:  LOS: 0 days   Chrystie Nose, MD, [facility], FACP  [place]  [facility]  Medical Director of the [facility] Lipid Disorders &  Cardiovascular Risk Reduction Clinic Diplomate of the American Board of Clinical Lipidology Attending Cardiologist  Direct Dial: [phone]  Fax: [phone]  Website:  [email or web address] 10/16/2018, 9:33 AM

## 2018-10-16 NOTE — Discharge Instructions (Addendum)

## 2018-10-16 NOTE — ED Provider Notes (Addendum)
[facility] EMERGENCY DEPARTMENT Provider Note   CSN: [id number] Arrival date & time: 10/16/18  4098    History   Chief Complaint Chief Complaint  Patient presents with  . Shortness of Breath    HPI Sabrina Douglas is a 78 y.o. female.     The history is provided by the patient.  Shortness of Breath  Severity:  Severe Onset quality:  Gradual Timing:  Constant Progression:  Unchanged Chronicity:  Recurrent Context: not smoke exposure and not URI   Context comment:  Status post cardiac cath 3/9 Relieved by:  Nothing Worsened by:  Nothing Ineffective treatments:  None tried Associated symptoms: no abdominal pain, no chest pain, no diaphoresis, no ear pain, no fever, no headaches, no hemoptysis, no neck pain, no sputum production, no syncope and no wheezing   Risk factors: no recent alcohol use, no obesity, no oral contraceptive use, no recent surgery and no tobacco use   Patient with h/o CHF and EF of 60% who had a right heart cath yesterday presents   Past Medical History:  Diagnosis Date  . Anxiety   . Depression   . Hyperlipidemia     Patient Active Problem List   Diagnosis Date Noted  . Shortness of breath   . Pulmonary hypertension, unspecified (HCC)   . Malnutrition of moderate degree 10/01/2018  . Hyponatremia 09/29/2018  . Essential hypertension 09/29/2018  . Lower extremity edema 09/29/2018  . Dysphagia 09/29/2018  . ILD (interstitial lung disease) (HCC) 09/18/2018  . Pulmonary infiltrates on CXR 08/28/2018    Past Surgical History:  Procedure Laterality Date  . BALLOON DILATION N/A 10/04/2018   Procedure: BALLOON DILATION;  Surgeon: Kathi Der, MD;  Location: [facility] ENDOSCOPY;  Service: Gastroenterology;  Laterality: N/A;  . BIOPSY  10/04/2018   Procedure: BIOPSY;  Surgeon: Kathi Der, MD;  Location: [facility]OSCOPY;  Service: Gastroenterology;;  . ESOPHAGOGASTRODUODENOSCOPY (EGD) WITH PROPOFOL N/A 10/04/2018   Procedure: ESOPHAGOGASTRODUODENOSCOPY (EGD) WITH PROPOFOL;  Surgeon: Kathi Der, MD;  Location: [facility]OSCOPY;  Service: Gastroenterology;  Laterality: N/A;  . RIGHT HEART CATH N/A 10/15/2018   Procedure: RIGHT HEART CATH;  Surgeon: Dolores Patty, MD;  Location: [facility] INVASIVE CV LAB;  Service: Cardiovascular;  Laterality: N/A;     OB History   No obstetric history on file.      Home Medications    Prior to Admission medications   Medication Sig Start Date End Date Taking? Authorizing Provider  albuterol (PROVENTIL HFA;VENTOLIN HFA) 108 (90 Base) MCG/ACT inhaler Inhale 2 puffs into the lungs every 6 (six) hours as needed for wheezing or shortness of breath. 09/18/18   Glenford Bayley, NP  ALPRAZolam Prudy Feeler) 0.25 MG tablet Take 0.125-0.25 mg by mouth 2 (two) times daily as needed for anxiety.     [provider]  demeclocycline (DECLOMYCIN) 150 MG tablet Take 1 tablet (150 mg total) by mouth 2 (two) times daily. 10/05/18   Rai, Delene Ruffini, MD  furosemide (LASIX) 40 MG tablet Take 1 tablet (40 mg total) by mouth daily. 10/06/18   Rai, Ripudeep K, MD  Glycerin-Hypromellose-PEG 400 ([facility] DRY EYE RELIEF) 0.2-0.2-1 % SOLN Place 2 drops into both eyes as needed (Dry eyes).    [provider]  metoprolol tartrate (LOPRESSOR) 25 MG tablet Take 1 tablet (25 mg total) by mouth 2 (two) times daily. 10/10/18   Philip Aspen, Limmie Patricia, MD  Multiple Vitamin (MULTIVITAMIN WITH MINERALS) TABS tablet Take 1 tablet by mouth daily.  [provider]  nystatin (MYCOSTATIN) 100000 UNIT/ML suspension Take 5 mLs (500,000 Units total) by mouth 4 (four) times daily for 7 days. 10/10/18 10/17/18  Henderson Cloud, MD  pantoprazole (PROTONIX) 40 MG tablet Take 40 mg by mouth daily.    [provider]  Respiratory Therapy Supplies (FLUTTER) DEVI 1 Device by Does not apply route as needed. 09/19/18   Glenford Bayley, NP  simvastatin (ZOCOR) 20 MG tablet Take 1 tablet  (20 mg total) by mouth daily. Hold until follow-up with PCP and labs 10/05/18   Rai, Delene Ruffini, MD  spironolactone (ALDACTONE) 25 MG tablet Take 25 mg by mouth daily.    [provider]    Family History Family History  Problem Relation Age of Onset  . Sudden death Mother 61       sudden death post hysterectomy  . Prostate cancer Father   . Diabetes Father     Social History Social History   Tobacco Use  . Smoking status: Never Smoker  . Smokeless tobacco: Never Used  Substance Use Topics  . Alcohol use: Never    Frequency: Never  . Drug use: Not on file     Allergies   Patient has no known allergies.   Review of Systems Review of Systems  Constitutional: Negative for diaphoresis, fatigue and fever.  HENT: Negative for ear pain.   Respiratory: Positive for shortness of breath. Negative for hemoptysis, sputum production and wheezing.   Cardiovascular: Positive for palpitations. Negative for chest pain, leg swelling and syncope.  Gastrointestinal: Negative for abdominal pain.  Genitourinary: Negative for flank pain.  Musculoskeletal: Negative for neck pain.  Neurological: Negative for headaches.  All other systems reviewed and are negative.    Physical Exam Updated Vital Signs BP (!) 80/65   Pulse (!) 106   Temp (!) 97 F (36.1 C) (Temporal)   Resp (!) 22   SpO2 98%   Physical Exam Vitals signs and nursing note reviewed.  Constitutional:      Appearance: She is normal weight. She is diaphoretic.  HENT:     Head: Normocephalic and atraumatic.     Nose: Nose normal.     Mouth/Throat:     Mouth: Mucous membranes are moist.     Pharynx: Oropharynx is clear.  Eyes:     Extraocular Movements: Extraocular movements intact.  Neck:     Musculoskeletal: Normal range of motion and neck supple.  Cardiovascular:     Rate and Rhythm: Tachycardia present. Rhythm irregular.     Pulses: Normal pulses.  Pulmonary:     Breath sounds: Normal breath sounds. No  wheezing or rales.  Abdominal:     General: Abdomen is flat. Bowel sounds are normal.     Tenderness: There is no abdominal tenderness. There is no guarding or rebound.  Musculoskeletal:     Right lower leg: Edema present.     Left lower leg: Edema present.  Skin:    General: Skin is warm.     Capillary Refill: Capillary refill takes less than 2 seconds.  Neurological:     General: No focal deficit present.     Mental Status: She is alert and oriented to person, place, and time.     Deep Tendon Reflexes: Reflexes normal.  Psychiatric:        Mood and Affect: Mood normal.        Behavior: Behavior normal.      ED Treatments / Results  Labs (  all labs ordered are listed, but only abnormal results are displayed) Results for orders placed or performed during the hospital encounter of 10/16/18  Basic metabolic panel  Result Value Ref Range   Sodium 139 135 - 145 mmol/L   Potassium 3.2 (L) 3.5 - 5.1 mmol/L   Chloride 99 98 - 111 mmol/L   CO2 27 22 - 32 mmol/L   Glucose, Bld 156 (H) 70 - 99 mg/dL   BUN 13 8 - 23 mg/dL   Creatinine, Ser 1.63 0.44 - 1.00 mg/dL   Calcium 8.8 (L) 8.9 - 10.3 mg/dL   GFR calc non Af Amer >60 >60 mL/min   GFR calc Af Amer >60 >60 mL/min   Anion gap 13 5 - 15  Brain natriuretic peptide  Result Value Ref Range   B Natriuretic Peptide 38.4 0.0 - 100.0 pg/mL  I-stat troponin, ED  Result Value Ref Range   Troponin i, poc 0.07 0.00 - 0.08 ng/mL   Comment 3           Dg Chest 2 View  Result Date: 09/29/2018 CLINICAL DATA:  Shortness of breath. EXAM: CHEST - 2 VIEW COMPARISON:  Chest x-ray dated September 04, 2018. FINDINGS: The heart size and mediastinal contours are within normal limits. Normal pulmonary vascularity. Unchanged interstitial opacities at the left greater than right lung bases. No focal consolidation, pleural effusion, or pneumothorax. No acute osseous abnormality. Distended stomach. IMPRESSION: 1. Unchanged bibasilar interstitial lung disease.   No acute finding. Electronically Signed   By: Obie Dredge M.D.   On: 09/29/2018 14:46   Ct Angio Chest Pe W And/or Wo Contrast  Result Date: 10/16/2018 CLINICAL DATA:  Shortness of breath EXAM: CT ANGIOGRAPHY CHEST WITH CONTRAST TECHNIQUE: Multidetector CT imaging of the chest was performed using the standard protocol during bolus administration of intravenous contrast. Multiplanar CT image reconstructions and MIPs were obtained to evaluate the vascular anatomy. CONTRAST:  63mL OMNIPAQUE 350 COMPARISON:  Prior CTA from 09/04/2018 FINDINGS: Cardiovascular: Thoracic aorta shows no evidence of aneurysmal dilatation or dissection although opacifications somewhat limited due to the timing of the contrast bolus. The pulmonary artery shows no focal filling defect to suggest pulmonary embolism. Opacification of the inferior most lower lobe branches bilaterally is limited due to the timing of the imaging with relation to the contrast bolus. This is symmetrical in nature and likely related to the underlying fibrotic change. No significant coronary calcifications are noted at this time. Mediastinum/Nodes: Thoracic inlet demonstrates the right thyroid lobe to be somewhat prominent but stable from the prior exam. No significant hilar or mediastinal adenopathy is noted. The visualized esophagus is within normal limits. Lungs/Pleura: Lungs are well aerated bilaterally. Stable nodule along the major fissure on the left is seen best noted on image number 27 of series 6. It is stable in appearance. Stable scarring is noted in the lingula. Persistent lower lobe volume loss with peribronchial thickening and associated ground-glass opacity is noted stable from the previous exam. No new focal infiltrate is seen. No new nodular changes are noted. Upper Abdomen: Visualized upper abdomen demonstrates a left renal cyst stable from the prior exam. Musculoskeletal: Mild degenerative changes of the thoracic spine are seen. No acute  bony abnormality is noted. Review of the MIP images confirms the above findings. IMPRESSION: Chronic changes in the bases bilaterally. No evidence of pulmonary embolism. Evaluation of the inferior aspect of the lower lobes bilaterally is somewhat limited due to the chronic changes in the bases as well  as the timing of the contrast bolus. Stable nodule along the major fissure on the left. No follow-up needed if patient is low-risk. Non-contrast chest CT can be considered in 12 months if patient is high-risk. This recommendation follows the consensus statement: Guidelines for Management of Incidental Pulmonary Nodules Detected on CT Images: From the Fleischner Society 2017; Radiology 2017; 284:228-243. Electronically Signed   By: Alcide Clever M.D.   On: 10/16/2018 05:25   Nm Pulmonary Perf And Vent  Result Date: 10/03/2018 CLINICAL DATA:  Shortness of Breath EXAM: NUCLEAR MEDICINE VENTILATION - PERFUSION LUNG SCAN VIEWS: Anterior, posterior, left lateral, right lateral, RPO, LPO, RAO, LAO-ventilation and perfusion RADIOPHARMACEUTICALS:  30.6 mCi of Tc-51m DTPA aerosol inhalation and 4.4 mCi Tc54m MAA IV COMPARISON:  Chest radiograph September 29, 2018 FINDINGS: Ventilation: Radiotracer uptake is homogeneous and symmetric bilaterally. No ventilation defects are evident. Perfusion: Radiotracer uptake is homogeneous and symmetric bilaterally. No perfusion defects are evident. IMPRESSION: No appreciable ventilation or perfusion defects. Very low probability of pulmonary embolus. Electronically Signed   By: Bretta Bang III M.D.   On: 10/03/2018 14:01   Dg Chest Portable 1 View  Result Date: 10/16/2018 CLINICAL DATA:  Shortness of breath EXAM: PORTABLE CHEST 1 VIEW COMPARISON:  09/29/2018, 09/04/2018, 06/15/2018 FINDINGS: Bibasilar fibrosis. No acute consolidation or pleural effusion. Stable borderline cardiomegaly. No pneumothorax. IMPRESSION: No active disease.  Stable bibasilar fibrosis and/or scarring  Electronically Signed   By: Jasmine Pang M.D.   On: 10/16/2018 03:28   Dg Esophagus W Single Cm (sol Or Thin Ba)  Result Date: 10/01/2018 CLINICAL DATA:  Dysphagia. EXAM: ESOPHOGRAM/BARIUM SWALLOW TECHNIQUE: Single contrast examination was performed using thin barium or water soluble. FLUOROSCOPY TIME:  Fluoroscopy Time:  1 minutes 36 seconds Radiation Exposure Index (if provided by the fluoroscopic device): 16.5 Number of Acquired Spot Images: 0 COMPARISON:  None. FINDINGS: The study is limited due to patient condition. The patient could not fully stand and could not drink quickly. There is altered peristalsis with slow transit of the barium through the esophagus. There is smooth narrowing of the distal esophagus throughout the study is which never completely opens up. No obvious mass. Limited views in the mucosa are unremarkable. Limited views of the stomach are normal. The patient could not tolerate swelling and tablet. IMPRESSION: 1. There is smooth narrowing of the distal esophagus which never opens up. The findings are suspicious or a benign stricture/narrowing, resulting in the patient's symptoms. Peristalsis is considered less likely as the finding is seen on multiple occasions and the distal esophagus did not open up despite multiple attempts. However, the patient could not tolerate a tablet for confirmation of a distal stricture. Electronically Signed   By: Gerome Sam III M.D   On: 10/01/2018 14:45    EKG EKG Interpretation  Date/Time:  Tuesday October 16 2018 03:13:33 EDT Ventricular Rate:  132 PR Interval:    QRS Duration: 136 QT Interval:  321 QTC Calculation: 476 R Axis:   80 Text Interpretation:  Atrial fibrillation Right bundle branch block Confirmed by Ulus Hazen ([id number]) on 10/16/2018 3:17:15 AM   Radiology Dg Chest Portable 1 View  Result Date: 10/16/2018 CLINICAL DATA:  Shortness of breath EXAM: PORTABLE CHEST 1 VIEW COMPARISON:  09/29/2018, 09/04/2018, 06/15/2018  FINDINGS: Bibasilar fibrosis. No acute consolidation or pleural effusion. Stable borderline cardiomegaly. No pneumothorax. IMPRESSION: No active disease.  Stable bibasilar fibrosis and/or scarring Electronically Signed   By: Jasmine Pang M.D.   On: 10/16/2018 03:28  Procedures Procedures (including critical care time)  Medications Ordered in ED Medications  amiodarone (NEXTERONE) 1.8 mg/mL load via infusion 150 mg (150 mg Intravenous Bolus from Bag 10/16/18 0331)    Followed by  amiodarone (NEXTERONE PREMIX) 360-4.14 MG/200ML-% (1.8 mg/mL) IV infusion (60 mg/hr Intravenous New Bag/Given 10/16/18 0611)    Followed by  amiodarone (NEXTERONE PREMIX) 360-4.14 MG/200ML-% (1.8 mg/mL) IV infusion (has no administration in time range)  potassium chloride 10 mEq in 100 mL IVPB (has no administration in time range)  heparin ADULT infusion 100 units/mL (25000 units/2100mL sodium chloride 0.45%) (has no administration in time range)  sodium chloride 0.9 % bolus 1,000 mL (0 mLs Intravenous Stopped 10/16/18 0428)  sodium chloride 0.9 % bolus 1,000 mL (0 mLs Intravenous Stopped 10/16/18 0541)  iohexol (OMNIPAQUE) 350 MG/ML injection 80 mL (80 mLs Intravenous Contrast Given 10/16/18 0453)  heparin bolus via infusion 3,000 Units (3,000 Units Intravenous Bolus from Bag 10/16/18 0543)       CHA2DS2/VAS Stroke Risk Points      N/A >= 2 Points: High Risk score is 6  1 - 1.99 Points: Medium Risk  0 Points: Low Risk    A final score could not be computed because of missing components.:   Last Change: N/A     This score determines the patient's risk of having a stroke if the  patient has atrial fibrillation.      This score is not applicable to this patient. Components are not  calculated.    This patients CHA2DS2-VASc Score and unadjusted Ischemic Stroke Rate (% per year) is equal to 9.7 % stroke rate/year from a score of 6  Above score calculated as 1 point each if present [CHF, HTN, DM,  Vascular=MI/PAD/Aortic Plaque, Age if 65-74, or Female] Above score calculated as 2 points each if present [Age > 75, or Stroke/TIA/TE]  Patient is an ASA class 4 and cannot be sedated in the ED  Patient is mentating with these pressures and on manual BP I am getting a BP of 90/60 and the patient has radial and dorsalis pedis pulses with this pressure.    Seen by cardiology fellow who states the afib is not causing her hypotension as she is now controlled.  Cardiology will follow admit to PCCM   Case d/w Dr. Vaughan Basta of PCCM, they will see the patient and make a determination about admission to their service or triad.    MDM Reviewed: previous chart, nursing note and vitals Interpretation: labs, ECG and x-ray (normal troponin hypokalemia, no CHF on CXR) Total time providing critical care: 30-74 minutes (amiodarone drip and heparin drip and treatment of hypokalemia). This excludes time spent performing separately reportable procedures and services. Consults: cardiology and critical care  CRITICAL CARE Performed by: Christasia Angeletti K Londell Noll-Rasch Total critical care time: 60 minutes Critical care time was exclusive of separately billable procedures and treating other patients. Critical care was necessary to treat or prevent imminent or life-threatening deterioration. Critical care was time spent personally by me on the following activities: development of treatment plan with patient and/or surrogate as well as nursing, discussions with consultants, evaluation of patient's response to treatment, examination of patient, obtaining history from patient or surrogate, ordering and performing treatments and interventions, ordering and review of laboratory studies, ordering and review of radiographic studies, pulse oximetry and re-evaluation of patient's condition. Final Clinical Impressions(s) / ED Diagnoses   Admit for dyspnea and new on set afib patient will need anticoagulation.     Lennell Shanks,  Virgen Belland,  MD 10/16/18 9373    Cy Blamer, MD 10/16/18 2305

## 2018-10-16 NOTE — ED Notes (Signed)
Patient transported to CT scan . 

## 2018-10-17 ENCOUNTER — Telehealth (HOSPITAL_COMMUNITY): Payer: Self-pay

## 2018-10-17 DIAGNOSIS — I10 Essential (primary) hypertension: Secondary | ICD-10-CM | POA: Diagnosis not present

## 2018-10-17 DIAGNOSIS — I959 Hypotension, unspecified: Secondary | ICD-10-CM

## 2018-10-17 DIAGNOSIS — R600 Localized edema: Secondary | ICD-10-CM

## 2018-10-17 DIAGNOSIS — E876 Hypokalemia: Secondary | ICD-10-CM

## 2018-10-17 DIAGNOSIS — I4891 Unspecified atrial fibrillation: Secondary | ICD-10-CM | POA: Diagnosis not present

## 2018-10-17 DIAGNOSIS — E871 Hypo-osmolality and hyponatremia: Secondary | ICD-10-CM

## 2018-10-17 LAB — CBC
HCT: 38.2 % (ref 36.0–46.0)
Hemoglobin: 11.8 g/dL — ABNORMAL LOW (ref 12.0–15.0)
MCH: 30.3 pg (ref 26.0–34.0)
MCHC: 30.9 g/dL (ref 30.0–36.0)
MCV: 97.9 fL (ref 80.0–100.0)
Platelets: 286 10*3/uL (ref 150–400)
RBC: 3.9 MIL/uL (ref 3.87–5.11)
RDW: 12.6 % (ref 11.5–15.5)
WBC: 3.6 10*3/uL — ABNORMAL LOW (ref 4.0–10.5)
nRBC: 0 % (ref 0.0–0.2)

## 2018-10-17 LAB — BASIC METABOLIC PANEL
Anion gap: 5 (ref 5–15)
BUN: 6 mg/dL — ABNORMAL LOW (ref 8–23)
CO2: 28 mmol/L (ref 22–32)
Calcium: 9 mg/dL (ref 8.9–10.3)
Chloride: 109 mmol/L (ref 98–111)
Creatinine, Ser: 0.68 mg/dL (ref 0.44–1.00)
GFR calc Af Amer: >60 mL/min (ref >60)
GFR calc non Af Amer: >60 mL/min (ref >60)
Glucose, Bld: 105 mg/dL — ABNORMAL HIGH (ref 70–99)
Potassium: 4 mmol/L (ref 3.5–5.1)
Sodium: 142 mmol/L (ref 135–145)

## 2018-10-17 LAB — URINALYSIS, ROUTINE W REFLEX MICROSCOPIC
Bacteria, UA: NONE SEEN
Bilirubin Urine: NEGATIVE
Glucose, UA: NEGATIVE mg/dL
Ketones, ur: NEGATIVE mg/dL
Leukocytes,Ua: NEGATIVE
Nitrite: NEGATIVE
Protein, ur: NEGATIVE mg/dL
Specific Gravity, Urine: 1.01 (ref 1.005–1.030)
pH: 5 (ref 5.0–8.0)

## 2018-10-17 LAB — PHOSPHORUS: Phosphorus: 3.6 mg/dL (ref 2.5–4.6)

## 2018-10-17 LAB — MAGNESIUM: Magnesium: 2 mg/dL (ref 1.7–2.4)

## 2018-10-17 MED ORDER — POLYVINYL ALCOHOL 1.4 % OP SOLN
2.0000 [drp] | OPHTHALMIC | Status: DC | PRN
  Filled 2018-10-17: qty 15

## 2018-10-17 MED ORDER — MAGNESIUM OXIDE 400 (241.3 MG) MG PO TABS: 400.0000 mg | ORAL_TABLET | Freq: Every day | ORAL | 0 refills | Status: DC

## 2018-10-17 MED ORDER — APIXABAN 5 MG PO TABS: 5.0000 mg | ORAL_TABLET | Freq: Two times a day (BID) | ORAL | 0 refills | Status: DC

## 2018-10-17 MED ORDER — METOPROLOL TARTRATE 12.5 MG HALF TABLET
12.5000 mg | ORAL_TABLET | Freq: Two times a day (BID) | ORAL | Status: DC
  Administered 2018-10-17: 12.5 mg via ORAL
  Filled 2018-10-17: qty 1

## 2018-10-17 MED ORDER — FUROSEMIDE 40 MG PO TABS
40.0000 mg | ORAL_TABLET | Freq: Every day | ORAL | Status: DC
  Administered 2018-10-17: 40 mg via ORAL
  Filled 2018-10-17: qty 1

## 2018-10-17 MED ORDER — METOPROLOL TARTRATE 25 MG PO TABS: 25.0000 mg | ORAL_TABLET | Freq: Two times a day (BID) | ORAL | Status: DC

## 2018-10-17 MED ORDER — POTASSIUM CHLORIDE CRYS ER 20 MEQ PO TBCR: 20.0000 meq | EXTENDED_RELEASE_TABLET | Freq: Every day | ORAL | 0 refills | Status: DC

## 2018-10-17 MED ORDER — METOPROLOL TARTRATE 25 MG PO TABS: 12.5000 mg | ORAL_TABLET | Freq: Two times a day (BID) | ORAL | 1 refills | Status: DC

## 2018-10-17 MED ORDER — GLYCERIN-HYPROMELLOSE-PEG 400 0.2-0.2-1 % OP SOLN: 2.0000 [drp] | OPHTHALMIC | Status: DC | PRN

## 2018-10-17 MED FILL — ELIQUIS 5 MG TABLET: 5 | 30 days supply | Qty: 60 | Fill #0

## 2018-10-17 NOTE — Progress Notes (Signed)
NAME:  Sabrina Douglas, MRN:  [id number], DOB:  07/28/1941, LOS: 1 ADMISSION DATE:  10/16/2018, CONSULTATION DATE: 3/10 REFERRING MD:  Dr Nicanor Alcon, CHIEF COMPLAINT:  hypotension  Brief History   78 year old female with recent diagnosis of ILD followed by Dr. Vassie Loll.  Presented 3/10 with new onset atrial fibrillation with RVR.  Despite treatment, she remained in A. fib and developed hypotension.  History of present illness   78 year old female with past medical history as below, which is significant for hypertension, hyperlipidemia, and recent diagnosis of ILD followed by Dr. Vassie Loll.  Recent complicated course started back in October of last year where she was admitted for pneumonia.  He did not improve as expected and followed up with pulmonary for CT was concerning for fibrotic lung disease.  Serologies were grossly abnormal with no significant diagnosis made at that time.  High-res CT was concerning for fibrotic lung disease.  PFTs were concerning for diffusion defect not fully described by the imaging she was sent for right heart cath on 3/9 with normal pulmonary pressures.  In the early a.m. hours of 3/10 she developed acute onset shortness of breath which did not improve with rest as he normally did.  She called EMS with these complaints.  Upon their arrival she was found to be in atrial fibrillation with rapid ventricular response and rates up to the 180s.  She was given adenosine with extended pause and return to A. fib RVR.  Metoprolol was administered with only temporary improvement in her rate and subsequent hypotension.  She was treated with intravenous amiodarone with a modest improvement in her heart rate to the low 100s.  Despite IV fluid she remained hypotensive.  PCCM was asked to admit.  Past Medical History   has a past medical history of Anxiety, Depression, and Hyperlipidemia.  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 VQ 2/26 > No appreciable ventilation or perfusion defects. Very low probability of pulmonary embolus. CTA chest 3/10 > Chronic changes in the bases bilaterally. No evidence of pulmonary embolism. Stable nodule along the major fissure on the left.  Micro Data:    Antimicrobials:     Interim history/subjective:  Stable respiratory status.  Converted to normal sinus rhythm Remains hemodynamically stable. Plan for discharge today by primary team.  Objective   Blood pressure 131/61, pulse 66, temperature 98.6 F (37 C), temperature source Oral, resp. rate 18, height 5\' 5"  (1.651 m), weight 65.2 kg, SpO2 94 %.        Intake/Output Summary (Last 24 hours) at 10/17/2018 1042 Last data filed at 10/17/2018 0900 Gross per 24 hour  Intake 340 ml  Output 750 ml  Net -410 ml   Filed Weights   10/16/18 0508 10/17/18 0700  Weight: 63.5 kg 65.2 kg    Examination: Gen:      No acute distress HEENT:  EOMI, sclera anicteric Neck:     No masses; no thyromegaly Lungs:    Basal crackles. CV:         Regular rate and rhythm; no murmurs Abd:      + bowel sounds; soft, non-tender; no palpable masses, no distension Ext:    No edema; adequate peripheral perfusion Skin:      Warm  and dry; no rash Neuro: alert and oriented x 3 Psych: normal mood and affect    Resolved Hospital Problem list     Assessment & Plan:  Atrial Fibrillation with RVR Converted back to normal sinus rhythm.  Taken off amiodarone by primary team Cardiology on board. Recommending 30 days of anticoagulation  Interstitial lung disease: serologies tested in office and are as follows. (ANA positive, CCP neg, ANA Titer positive, SSA, SSB negative, RNP  Ab pos, anti smith pos, anti SCL neg, ANCA neg)  She has been referred to Dr. Dierdre Forth, rheumatology.  Awaiting their eval Check need for supplemental oxygen prior to discharge She has follow-up with Dr. Vassie Loll 3/12  Patient will likely get discharged today.  Pulmonary will be available as needed.  Please call with any questions.  Labs   CBC: Recent Labs  Lab 10/11/18 1645  10/15/18 0717 10/15/18 0719 10/15/18 0723 10/16/18 0534 10/17/18 0539  WBC 3.7*  --   --   --   --  3.7* 3.6*  NEUTROABS  --   --   --   --   --  2.8  --   HGB 14.3   < > 12.6 12.9 12.6 12.0 11.8*  HCT 44.5   < > 37.0 38.0 37.0 37.5 38.2  MCV 94.7  --   --   --   --  97.4 97.9  PLT 385  --   --   --   --  261 286   < > = values in this interval not displayed.    Basic Metabolic Panel: Recent Labs  Lab 10/10/18 1651  10/15/18 0717 10/15/18 0719 10/15/18 0723 10/16/18 0401 10/16/18 0618 10/17/18 0539  NA 136   < > 141 141 141 139  --  142  K 4.1   < > 3.3* 3.2* 3.2* 3.2*  --  4.0  CL 91*  --   --   --   --  99  --  109  CO2 38*  --   --   --   --  27  --  28  GLUCOSE 97  --   --   --   --  156*  --  105*  BUN 12  --   --   --   --  13  --  6*  CREATININE 0.68  --   --   --   --  0.87  --  0.68  CALCIUM 10.1  --   --   --   --  8.8*  --  9.0  MG  --   --   --   --   --   --  1.7 2.0  PHOS  --   --   --   --   --   --   --  3.6   < > = values in this interval not displayed.   GFR: Estimated Creatinine Clearance: 51.3 mL/min (by C-G formula based on SCr of 0.68 mg/dL). Recent Labs  Lab 10/11/18 1645 10/16/18 0534 10/17/18 0539  WBC 3.7* 3.7* 3.6*    Liver Function Tests: No results for input(s): AST, ALT, ALKPHOS, BILITOT, PROT, ALBUMIN in the last 168 hours. No results for input(s): LIPASE, AMYLASE in the last 168 hours. No results for input(s): AMMONIA in the last 168 hours.  ABG    Component Value Date/Time   HCO3 36.1 (H) 10/15/2018 0723   TCO2 38 (H) 10/15/2018 0723   O2SAT 83.0 10/15/2018 0723  Coagulation Profile: Recent  Labs  Lab 10/11/18 1645  INR 1.1    Cardiac Enzymes: No results for input(s): CKTOTAL, CKMB, CKMBINDEX, TROPONINI in the last 168 hours.  HbA1C: No results found for: HGBA1C  CBG: No results for input(s): GLUCAP in the last 168 hours.  Chilton Greathouse MD [facility] 10/17/2018, 10:46 AM

## 2018-10-17 NOTE — Evaluation (Signed)
Occupational Therapy Evaluation Patient Details Name: Sabrina Douglas MRN: [id number] DOB: Sep 03, 1940 Today's Date: 10/17/2018    History of Present Illness Pt admitted 10/16/18 with afib with RVR, SOB, hypotension, hypokalemia. PMH: interstitial lung disease, anxiety, depression, hyponatremia, recent admission with heart cath, HTN   Clinical Impression   Pt has been requiring assistance for ADL and IADL and ambulating with a rollator since her recent admission. Presents with chronic R shoulder pain, generalized weakness, decreased activity tolerance and impaired balance. Pt to discharge home with her supportive family later today. Recommend resumption of HHOT and HHPT. Will defer further OT.    Follow Up Recommendations  Home health OT;Supervision/Assistance - 24 hour    Equipment Recommendations  None recommended by OT    Recommendations for Other Services       Precautions / Restrictions Precautions Precautions: Fall Restrictions Weight Bearing Restrictions: No      Mobility Bed Mobility               General bed mobility comments: pt seated at EOB upon arrival  Transfers Overall transfer level: Needs assistance Equipment used: Rolling walker (2 wheeled) Transfers: Sit to/from Stand Sit to Stand: Supervision         General transfer comment: for safety    Balance Overall balance assessment: Needs assistance   Sitting balance-Leahy Scale: Good       Standing balance-Leahy Scale: Fair Standing balance comment: can release walker in static standing                           ADL either performed or assessed with clinical judgement   ADL Overall ADL's : Needs assistance/impaired Eating/Feeding: Independent;Sitting   Grooming: Supervision/safety;Standing   Upper Body Bathing: Minimal assistance;Sitting   Lower Body Bathing: Moderate assistance;Sit to/from stand   Upper Body Dressing : Set up;Sitting   Lower Body Dressing:  Moderate assistance;Sit to/from stand   Toilet Transfer: Supervision/safety;Ambulation;RW   Toileting- Clothing Manipulation and Hygiene: Supervision/safety;Sit to/from stand       Functional mobility during ADLs: Supervision/safety;Rolling walker General ADL Comments: instructed in energy conservation strategies, pt to go home with 02     Vision Baseline Vision/History: Wears glasses Wears Glasses: At all times Patient Visual Report: No change from baseline       Perception     Praxis      Pertinent Vitals/Pain Pain Assessment: Faces Faces Pain Scale: Hurts little more Pain Location: R shoulder Pain Descriptors / Indicators: Discomfort Pain Intervention(s): Monitored during session     Hand Dominance Right   Extremity/Trunk Assessment Upper Extremity Assessment Upper Extremity Assessment: Generalized weakness RUE Deficits / Details: limited shoulder movement--to 90 degrees LUE Deficits / Details: generalized weakness   Lower Extremity Assessment Lower Extremity Assessment: Defer to PT evaluation       Communication Communication Communication: No difficulties   Cognition Arousal/Alertness: Awake/alert Behavior During Therapy: WFL for tasks assessed/performed Overall Cognitive Status: Within Functional Limits for tasks assessed                                     General Comments       Exercises     Shoulder Instructions      Home Living Family/patient expects to be discharged to:: Private residence Living Arrangements: Children Available Help at Discharge: Family;Available 24 hours/day(family plans to stay with her 24 hours initially) Type  of Home: House Home Access: Stairs to enter [employer] of Steps: 1   Home Layout: One level     Bathroom Shower/Tub: Producer, television/film/video: Handicapped height     Home Equipment: Grab bars - tub/shower;Shower seat - built in;Walker - 4 wheels   Additional Comments:  family plans to get pt a medical alert system      Prior Functioning/Environment Level of Independence: Needs assistance  Gait / Transfers Assistance Needed: ambulating with rollator ADL's / Homemaking Assistance Needed: family was helping with ADL and IADL since recent admission   Comments: was receiving HHOT and PT prior to admission.        OT Problem List: Decreased strength;Decreased range of motion;Decreased activity tolerance;Impaired balance (sitting and/or standing);Impaired UE functional use      OT Treatment/Interventions:      OT Goals(Current goals can be found in the care plan section) Acute Rehab OT Goals Patient Stated Goal: to return to independent  OT Frequency:     Barriers to D/C:            Co-evaluation              AM-PAC OT "6 Clicks" Daily Activity     Outcome Measure Help from another person eating meals?: None Help from another person taking care of personal grooming?: A Little Help from another person toileting, which includes using toliet, bedpan, or urinal?: A Little Help from another person bathing (including washing, rinsing, drying)?: A Lot Help from another person to put on and taking off regular upper body clothing?: None Help from another person to put on and taking off regular lower body clothing?: A Lot 6 Click Score: 18   End of Session Equipment Utilized During Treatment: Rolling walker  Activity Tolerance: Patient tolerated treatment well Patient left: in bed;with call bell/phone within reach;with family/visitor present  OT Visit Diagnosis: Muscle weakness (generalized) (M62.81)                Time: 1110-1140 OT Time Calculation (min): 30 min Charges:  OT General Charges $OT Visit: 1 Visit OT Evaluation $OT Eval Moderate Complexity: 1 Mod OT Treatments $Self Care/Home Management : 8-22 mins  Martie Round, OTR/L Acute Rehabilitation Services Pager: [phone] Office: [phone]  Sabrina Douglas 10/17/2018, 11:50 AM

## 2018-10-17 NOTE — Discharge Summary (Signed)
Sabrina Douglas, is a 78 y.o. female  DOB [birth year]  MRN [id number].  Admission date:  10/16/2018  Admitting Physician  Dorcas Carrow, MD  Discharge Date:  10/17/2018   Primary MD  Philip Aspen, Limmie Patricia, MD  Recommendations for primary care physician for things to follow:   -Recheck electrolytes potassium and magnesium now on replacement  -Blood pressure monitoring and adjustment of medications as needed   Discharge Diagnosis   Principal Problem:   New onset a-fib (HCC) Active Problems:   ILD (interstitial lung disease) (HCC)   Hyponatremia   Essential hypertension   Lower extremity edema   Dysphagia   Malnutrition of moderate degree   Pulmonary hypertension, unspecified (HCC)   Hypotension (arterial)      Past Medical History:  Diagnosis Date   Anxiety    Depression    Hyperlipidemia     Past Surgical History:  Procedure Laterality Date   BALLOON DILATION N/A 10/04/2018   Procedure: BALLOON DILATION;  Surgeon: Kathi Der, MD;  Location: [facility];  Service: Gastroenterology;  Laterality: N/A;   BIOPSY  10/04/2018   Procedure: BIOPSY;  Surgeon: Kathi Der, MD;  Location: [facility];  Service: Gastroenterology;;   ESOPHAGOGASTRODUODENOSCOPY (EGD) WITH PROPOFOL N/A 10/04/2018   Procedure: ESOPHAGOGASTRODUODENOSCOPY (EGD) WITH PROPOFOL;  Surgeon: Kathi Der, MD;  Location: [facility];  Service: Gastroenterology;  Laterality: N/A;   RIGHT HEART CATH N/A 10/15/2018   Procedure: RIGHT HEART CATH;  Surgeon: Dolores Patty, MD;  Location: [facility];  Service: Cardiovascular;  Laterality: N/A;       HPI  from the history and physical done on the day of admission:    Sabrina Douglas is a 78 y.o. female with medical history significant of interstitial lung  disease that was recently diagnosed, anxiety, depression, hyperlipidemia, hyponatremia, recent extensive hospitalizations, diagnostic right heart cath on 10/15/2018 presenting to the hospital with acute onset of shortness of breath.  According to the patient, she has progressive shortness of breath, she is mostly housebound.  She has been getting work-up for interstitial lung disease and underwent right heart cath yesterday.  She was recently in the hospital at [facility] long with hyponatremia as low as 114 and now currently remains on demeclocycline.  She went home last night from the hospital.  Early morning, she was getting around in the house, she felt very short of breath, told her daughter and they called EMS to bring patient to the ER. Her shortness of breath is usually improved at home with rest, this time it did not improve.  She also felt some palpitations and became very anxious.  No fever or chills. Denies any chest pain or wheezing. Patient has chronic lower extremity edema left more than right.  ED Course: EMS noted rapid A. fib with heart rate 160, she was given metoprolol IV,Adenosine with prolonged pause, and brought to the ER.  In the emergency room initial blood pressures were 70s/50s.  She was given 2 L of normal saline, loaded with  amiodarone and put on maintenance amiodarone.  She was also started on heparin. After 2 L of saline boluses and rate control with amiodarone, patient currently improved.  Blood pressures improved.  She is mentating well.  Patient was seen by cardiology and PCCM in the ER. CTA shows ILD changes.  No PE.     Hospital Course:   1.  New onset A. fib with RVR: Patient heart rates into the 130s. Blood pressures not adequate for other rate control medications including Cardizem and metoprolol.  Normal ejection fraction on echocardiogram done last week. Normal right sided heart cath on 10/15/2018.  No previous history of A. Fib. She was initially placed on heparin and  amiodarone(due to hypotension) drip. Patient has interstitial lung disease and therefore is not candidate for long-term amiodarone.  Question the need of cardioversion.  Patient was noted to have converted back into sinus rhythm while in the emergency department with improvement blood pressures.  Cardiology was consulted and discontinued amiodarone drip and started patient on Eliquis as CHA2DS2-VASc score = 4. Daughter handles her mother's medication and reports that spironolactone was stopped February 22 after her last hospitalization. Patient's home medication list appeared to show that she was still taking metoprolol 25 mg twice daily, but reported that this medication actually had been stopped approximately 4 to 5 days ago during that last appointment with Dr. Gala Romney because of low blood pressures.  Review of office records does not support this.  Contacted heart failure team and they will set her up with a earlier appointment.  As patient had not been on any rate control medication discussed taking metoprolol decreased dose of 12.5 mg twice daily.  Recommended patient monitoring blood pressures at home and following up with cardiology in the clinic for further evaluation and for need of medication adjustment.  Holter monitor was ordered prior to discharge and medications of Eliquis which were new were brought to bedside by transitions of care pharmacy..   2.  Hypotension, history of hypertension: Resolved. Probably related to A. fib possibly also medication induced. No evidence of sepsis.  Stabilized with fluid boluses.    Blood pressures stable today.  Restarted lower dose metoprolol and Lasix.                                                                                                                                       3.  Advanced interstitial lung disease with hypoxia: Followed by pulmonology.  Continue bronchodilators. Levo-albuterol given in hospital to avoid tachycardia. Patient documented  to have O2 saturations with ambulation as low as 79% while ambulating.  Care management set patient up with home oxygen that were delivered prior to leaving the hospital.  4.    History of hyponatremia: Resolved now.   Patient is currently followed with nephrology demeclocycline  5.  Hypokalemia: Replaced.     Follow UP Patient advised to keep follow-up upcoming  appointment including PCP 3/17, cardiology Dr. Gala Romney 3/18, pulmonology Dr. Vassie Loll 3/12, and referrals to rheumatology.   Follow-up Information    Philip Aspen, Limmie Patricia, MD. Schedule an appointment as soon as possible for a visit in 1 week(s).   Specialty:  Internal Medicine Contact information: [address] [place] [address] [phone]        Bensimhon, Bevelyn Buckles, MD. Go on 10/24/2018.   Specialty:  Cardiology Why:  11:20 AM, parking code [employer] information: [address] [place] [address] [phone]        [facility] Follow up.   Specialty:  Home Health Services Why:  Home Health Registered Nurse, Physical and Occupational Therapy Contact information: [address] [place] [address] [phone]        [facility] Follow up.   Why:  home oxygen Contact information: Phone: [phone] Fax: [phone]            Consults obtained: Cardiology  Discharge Condition: Fair  Diet and Activity recommendation: See Discharge Instructions below   Discharge Instructions    Discharge instructions   Complete by:  As directed    Follow with Primary MD Philip Aspen, Limmie Patricia, MD in 7 days.  You were diagnosed with atrial fibrillation for which it is recommended that you are started on anticoagulation of Eliquis.  This may only be for a short period in time, but will be determined by cardiology in follow-up.  Metoprolol had recently been stopped, but we would like to restart it at a lower dose of 12.5 mg twice daily.  You can break 25  mg pills in half to make 12.5 mg to be taken twice daily so that you do not have to fill a new prescription at this time.  During her hospital stay you were noted to have low potassium and low normal magnesium levels for which we have started you on supplementation.  Goal is for your potassium to be around 4 mmol/L and your magnesium to be around 2 mg/dL.  Please be advised to keep all future upcoming appointments.  Get CBC, BMP, Magnesium- checked  by Primary MD or SNF MD in 5-7 days ( we routinely change or add medications that can affect your baseline labs and fluid status, therefore we recommend that you get the mentioned basic workup next visit with your PCP, your PCP may decide not to get them or add new tests based on their clinical decision)  Activity: As tolerated with fall precautions  Equipment: Oxygen  Referrals: Home health  Disposition:  Home         For Heart failure patients - Check your Weight same time everyday, if you gain over 2 pounds, or you develop in leg swelling, experience more shortness of breath or chest pain, call your Primary doctor immediately. Follow Cardiac Low Salt Diet and 1.5 lit/day fluid restriction.  Special Instructions: If you have smoked or chewed Tobacco  in the last 2 yrs please stop smoking, stop any regular Alcohol  and or any Recreational drug use.  On your next visit with your primary care physician please Get Medicines reviewed and adjusted.  Please request your Philip Aspen, Limmie Patricia, MD to go over all Hospital Tests and Procedure/Radiological results at the follow up, please get all Hospital records sent to your Prim MD by signing hospital release before you go home.  If you experience worsening of your admission symptoms, develop shortness of breath, life threatening emergency,  suicidal or homicidal thoughts you must seek medical attention immediately by calling 911 or calling your MD immediately  if symptoms less severe.  You Must read  complete instructions/literature along with all the possible adverse reactions/side effects for all the Medicines you take and that have been prescribed to you. Take any new Medicines after you have completely understood and accpet all the possible adverse reactions/side effects.   Do not drive, operate heavy machinery, perform activities at heights, swimming or participation in water activities or provide baby sitting services if your were admitted for syncope or siezures until you have seen by Primary MD or a Neurologist and advised to do so again.  Do not drive when taking Pain medications.  Do not take more than prescribed Pain, Sleep and Anxiety Medications  Wear Seat belts while driving.   Please note  You were cared for by a hospitalist during your hospital stay. If you have any questions about your discharge medications or the care you received while you were in the hospital after you are discharged, you can call the unit and asked to speak with the hospitalist on call if the hospitalist that took care of you is not available. Once you are discharged, your primary care physician will handle any further medical issues. Please note that NO REFILLS for any discharge medications will be authorized once you are discharged, as it is imperative that you return to your primary care physician (or establish a relationship with a primary care physician if you do not have one) for your aftercare needs so that they can reassess your need for medications and monitor your lab values.        Discharge Medications     Allergies as of 10/17/2018   No Known Allergies     Medication List    STOP taking these medications   simvastatin 20 MG tablet Commonly known as:  ZOCOR   spironolactone 25 MG tablet Commonly known as:  ALDACTONE     TAKE these medications   albuterol 108 (90 Base) MCG/ACT inhaler Commonly known as:  PROVENTIL HFA;VENTOLIN HFA Inhale 2 puffs into the lungs every 6 (six) hours  as needed for wheezing or shortness of breath.   ALPRAZolam 0.25 MG tablet Commonly known as:  XANAX Take 0.125-0.25 mg by mouth 2 (two) times daily as needed for anxiety.   apixaban 5 MG Tabs tablet Commonly known as:  ELIQUIS Take 1 tablet (5 mg total) by mouth 2 (two) times daily.   [facility] Dry Eye Relief 0.2-0.2-1 % Soln Generic drug:  Glycerin-Hypromellose-PEG 400 Place 2 drops into both eyes as needed (Dry eyes).   demeclocycline 150 MG tablet Commonly known as:  DECLOMYCIN Take 1 tablet (150 mg total) by mouth 2 (two) times daily.   Flutter Devi 1 Device by Does not apply route as needed.   furosemide 40 MG tablet Commonly known as:  LASIX Take 1 tablet (40 mg total) by mouth daily.   magnesium oxide 400 (241.3 Mg) MG tablet Commonly known as:  MAG-OX Take 1 tablet (400 mg total) by mouth daily.   metoprolol tartrate 25 MG tablet Commonly known as:  LOPRESSOR Take 0.5 tablets (12.5 mg total) by mouth 2 (two) times daily. What changed:  how much to take   multivitamin with minerals Tabs tablet Take 1 tablet by mouth daily.   pantoprazole 40 MG tablet Commonly known as:  PROTONIX Take 40 mg by mouth daily.   potassium chloride SA 20 MEQ tablet Commonly  known as:  K-DUR,KLOR-CON Take 1 tablet (20 mEq total) by mouth daily.       Major procedures and Radiology Reports - PLEASE review detailed and final reports for all details, in brief -      Dg Chest 2 View  Result Date: 09/29/2018 CLINICAL DATA:  Shortness of breath. EXAM: CHEST - 2 VIEW COMPARISON:  Chest x-ray dated September 04, 2018. FINDINGS: The heart size and mediastinal contours are within normal limits. Normal pulmonary vascularity. Unchanged interstitial opacities at the left greater than right lung bases. No focal consolidation, pleural effusion, or pneumothorax. No acute osseous abnormality. Distended stomach. IMPRESSION: 1. Unchanged bibasilar interstitial lung disease.  No acute finding.  Electronically Signed   By: Obie Dredge M.D.   On: 09/29/2018 14:46   Ct Angio Chest Pe W And/or Wo Contrast  Result Date: 10/16/2018 CLINICAL DATA:  Shortness of breath EXAM: CT ANGIOGRAPHY CHEST WITH CONTRAST TECHNIQUE: Multidetector CT imaging of the chest was performed using the standard protocol during bolus administration of intravenous contrast. Multiplanar CT image reconstructions and MIPs were obtained to evaluate the vascular anatomy. CONTRAST:  80mL OMNIPAQUE 350 COMPARISON:  Prior CTA from 09/04/2018 FINDINGS: Cardiovascular: Thoracic aorta shows no evidence of aneurysmal dilatation or dissection although opacifications somewhat limited due to the timing of the contrast bolus. The pulmonary artery shows no focal filling defect to suggest pulmonary embolism. Opacification of the inferior most lower lobe branches bilaterally is limited due to the timing of the imaging with relation to the contrast bolus. This is symmetrical in nature and likely related to the underlying fibrotic change. No significant coronary calcifications are noted at this time. Mediastinum/Nodes: Thoracic inlet demonstrates the right thyroid lobe to be somewhat prominent but stable from the prior exam. No significant hilar or mediastinal adenopathy is noted. The visualized esophagus is within normal limits. Lungs/Pleura: Lungs are well aerated bilaterally. Stable nodule along the major fissure on the left is seen best noted on image number 27 of series 6. It is stable in appearance. Stable scarring is noted in the lingula. Persistent lower lobe volume loss with peribronchial thickening and associated ground-glass opacity is noted stable from the previous exam. No new focal infiltrate is seen. No new nodular changes are noted. Upper Abdomen: Visualized upper abdomen demonstrates a left renal cyst stable from the prior exam. Musculoskeletal: Mild degenerative changes of the thoracic spine are seen. No acute bony abnormality is  noted. Review of the MIP images confirms the above findings. IMPRESSION: Chronic changes in the bases bilaterally. No evidence of pulmonary embolism. Evaluation of the inferior aspect of the lower lobes bilaterally is somewhat limited due to the chronic changes in the bases as well as the timing of the contrast bolus. Stable nodule along the major fissure on the left. No follow-up needed if patient is low-risk. Non-contrast chest CT can be considered in 12 months if patient is high-risk. This recommendation follows the consensus statement: Guidelines for Management of Incidental Pulmonary Nodules Detected on CT Images: From the Fleischner Society 2017; Radiology 2017; 284:228-243. Electronically Signed   By: Alcide Clever M.D.   On: 10/16/2018 05:25   Nm Pulmonary Perf And Vent  Result Date: 10/03/2018 CLINICAL DATA:  Shortness of Breath EXAM: NUCLEAR MEDICINE VENTILATION - PERFUSION LUNG SCAN VIEWS: Anterior, posterior, left lateral, right lateral, RPO, LPO, RAO, LAO-ventilation and perfusion RADIOPHARMACEUTICALS:  30.6 mCi of Tc-100m DTPA aerosol inhalation and 4.4 mCi Tc51m MAA IV COMPARISON:  Chest radiograph September 29, 2018 FINDINGS: Ventilation: Radiotracer  uptake is homogeneous and symmetric bilaterally. No ventilation defects are evident. Perfusion: Radiotracer uptake is homogeneous and symmetric bilaterally. No perfusion defects are evident. IMPRESSION: No appreciable ventilation or perfusion defects. Very low probability of pulmonary embolus. Electronically Signed   By: Bretta Bang III M.D.   On: 10/03/2018 14:01   Dg Chest Portable 1 View  Result Date: 10/16/2018 CLINICAL DATA:  Shortness of breath EXAM: PORTABLE CHEST 1 VIEW COMPARISON:  09/29/2018, 09/04/2018, 06/15/2018 FINDINGS: Bibasilar fibrosis. No acute consolidation or pleural effusion. Stable borderline cardiomegaly. No pneumothorax. IMPRESSION: No active disease.  Stable bibasilar fibrosis and/or scarring Electronically Signed    By: Jasmine Pang M.D.   On: 10/16/2018 03:28   Dg Esophagus W Single Cm (sol Or Thin Ba)  Result Date: 10/01/2018 CLINICAL DATA:  Dysphagia. EXAM: ESOPHOGRAM/BARIUM SWALLOW TECHNIQUE: Single contrast examination was performed using thin barium or water soluble. FLUOROSCOPY TIME:  Fluoroscopy Time:  1 minutes 36 seconds Radiation Exposure Index (if provided by the fluoroscopic device): 16.5 Number of Acquired Spot Images: 0 COMPARISON:  None. FINDINGS: The study is limited due to patient condition. The patient could not fully stand and could not drink quickly. There is altered peristalsis with slow transit of the barium through the esophagus. There is smooth narrowing of the distal esophagus throughout the study is which never completely opens up. No obvious mass. Limited views in the mucosa are unremarkable. Limited views of the stomach are normal. The patient could not tolerate swelling and tablet. IMPRESSION: 1. There is smooth narrowing of the distal esophagus which never opens up. The findings are suspicious or a benign stricture/narrowing, resulting in the patient's symptoms. Peristalsis is considered less likely as the finding is seen on multiple occasions and the distal esophagus did not open up despite multiple attempts. However, the patient could not tolerate a tablet for confirmation of a distal stricture. Electronically Signed   By: Gerome Sam III M.D   On: 10/01/2018 14:45    Micro Results     No results found for this or any previous visit (from the past 240 hour(s)).     Today   Subjective    Essynce Munsch reports that her blood pressure is going up and that her heart rates are always elevated when she is up and moving.  Patient daughter help keep track of patient's medication and reports that she was advised to stop taking spironolactone after her last hospitalization in February, and had recently been told to discontinue metoprolol after her last appointment with Dr.  Gala Romney due to low blood pressures.  Patient was previously taking 25 mg of metoprolol twice daily.  She had not been on any replacement for potassium.   Objective   Blood pressure (!) 149/67, pulse 81, temperature 98.6 F (37 C), temperature source Oral, resp. rate 18, height 5\' 5"  (1.651 m), weight 65.2 kg, SpO2 94 %.   Intake/Output Summary (Last 24 hours) at 10/17/2018 1213 Last data filed at 10/17/2018 0900 Gross per 24 hour  Intake 240 ml  Output 750 ml  Net -510 ml    Exam  Constitutional: Elderly female in NAD, calm, comfortable Eyes: PERRL, lids and conjunctivae normal ENMT: Mucous membranes are moist. Posterior pharynx clear of any exudate or lesions. Neck: normal, supple, no masses, no thyromegaly Respiratory: Decreased overall aeration no significant crackles.  Currently on 2 L nasal cannula oxygen. Cardiovascular: Regular rate and rhythm, no murmurs / rubs / gallops.  1+ pitting extremity edema(reports chronic). 2+ pedal pulses. No  carotid bruits.  Abdomen: no tenderness, no masses palpated. No hepatosplenomegaly. Bowel sounds positive.  Musculoskeletal: no clubbing / cyanosis. No joint deformity upper and lower extremities. Good ROM, no contractures. Normal muscle tone.  Skin: no rashes, lesions, ulcers. No induration Neurologic: CN 2-12 grossly intact. Sensation intact, DTR normal. Strength 5/5 in all 4.  Psychiatric: Normal judgment and insight. Alert and oriented x 3. Normal mood.    Data Review   CBC w Diff:  Lab Results  Component Value Date   WBC 3.6 (L) 10/17/2018   HGB 11.8 (L) 10/17/2018   HCT 38.2 10/17/2018   PLT 286 10/17/2018   LYMPHOPCT 6 10/16/2018   MONOPCT 9 10/16/2018   EOSPCT 0 10/16/2018   BASOPCT 0 10/16/2018    CMP:  Lab Results  Component Value Date   NA 142 10/17/2018   K 4.0 10/17/2018   CL 109 10/17/2018   CO2 28 10/17/2018   BUN 6 (L) 10/17/2018   CREATININE 0.68 10/17/2018   PROT 7.4 10/03/2018   ALBUMIN 3.6 10/03/2018     ALBUMIN 3.6 10/03/2018   BILITOT 0.6 10/03/2018   ALKPHOS 65 10/03/2018   AST 72 (H) 10/03/2018   ALT 32 10/03/2018  .   Total Time in preparing paper work, data evaluation and todays exam - 35 minutes  Clydie Braun M.D on 10/17/2018 at 12:13 PM  [facility]   Office  [phone]     New onset A. fib with RVR: Currently stabilized on amiodarone bolus and then amiodarone infusion.  Blood pressures not adequate for other rate control medications including Cardizem and metoprolol.  Normal ejection fraction on echocardiogram done last week.  Normal right sided heart cath on 10/15/2018.  No previous history of A. Fib. Continue amiodarone as this is the only medication of choice today given hypotension, patient has interstitial lung disease, she is not candidate for long-term amiodarone.  She may need cardioversion.  We will continue amiodarone per symptom control, continue heparin infusion for anticoagulation. Patient will be seen by cardiology.  Metoprolol decreased from 25 mg twice daily to 12.5 mg twice daily    Hypotension, history of hypertension: Resolved.probably related to A. fib along with medication induced hypotension.  No evidence of sepsis.  Stabilized with fluid boluses.    Blood pressures stable today.  Restart metoprolol and Lasix.  `  3.  Advanced interstitial lung disease with hypoxia: Followed by pulmonology.  Continue bronchodilators. Levo-albuterol to avoid tachycardia.  4.  Hyponatremia: Resolved now. She is on maintenance demeclocycline that was continued.  5.  Hypokalemia: We will replace potassium to keep potassium level more than 4.  Also replace magnesium to keep more than 2.  Recheck  potassium and magnesium with phosphorus in the morning

## 2018-10-17 NOTE — Progress Notes (Addendum)
Discharge Note:  Patient and daughter given AVS and instructions for discharged. Patient has oxygen delivered to room. Questions answered. PIV removed. Eliquis from pharmacy. Discharge via wheelchair.

## 2018-10-17 NOTE — TOC Initial Note (Addendum)
Transition of Care [facility]) - Initial/Assessment Note    Patient Details  Name: Sabrina Douglas MRN: [id number] Date of Birth: 1941-07-25  Transition of Care [facility]) CM/SW Contact:    Midge Minium RN, BSN, NCM-BC, ACM-RN [phone] Phone Number: 10/17/2018, 11:31 AM  Clinical Narrative:                 CM met with patient/daughter to discuss dispositional needs. Patient lives at home alone with support provided by patient 3 daughters as needed. Patient uses a rollator to assist during ambulation. DME referral given to [facility] [place] liaison; AVS updated. Patient was open to [facility] for HHPT/OT services, with a HHRN added as requested per patient's daughter. HH referral discussed with Sharyn Lull, [facility]; AVS updated. No further needs from CM.   Expected Discharge Plan: [place] Barriers to Discharge: Continued Medical Work up   Patient Goals and CMS Choice Patient states their goals for this hospitalization and ongoing recovery are:: "To get home and feel better" CMS Medicare.gov Compare Post Acute Care list provided to:: Patient Choice offered to / list presented to : Patient  Expected Discharge Plan and Services Expected Discharge Plan: [place] Discharge Planning Services: CM Consult Post Acute Care Choice: Durable Medical Equipment Living arrangements for the past 2 months: Single Family Home                 DME Arranged: Oxygen DME Agency: [facility] HH Arranged: RN, PT, OT HH Agency: [facility]  Prior Living Arrangements/Services Living arrangements for the past 2 months: Single Family Home Lives with:: Self Patient language and need for interpreter reviewed:: No Do you feel safe going back to the place where you live?: Yes      Need for Family Participation in Patient Care: Yes (Comment) Care giver support system in place?: Yes (comment) Current home services: Home OT, Home PT Criminal Activity/Legal  Involvement Pertinent to Current Situation/Hospitalization: No - Comment as needed  Activities of Daily Living Home Assistive Devices/Equipment: Eyeglasses, Dentures (specify type), Shower chair with back, Walker (specify type), Oxygen(will need oxygen at home ) ADL Screening (condition at time of admission) Patient's cognitive ability adequate to safely complete daily activities?: Yes Is the patient deaf or have difficulty hearing?: No Does the patient have difficulty seeing, even when wearing glasses/contacts?: Yes Does the patient have difficulty concentrating, remembering, or making decisions?: No Patient able to express need for assistance with ADLs?: Yes Does the patient have difficulty dressing or bathing?: Yes Independently performs ADLs?: Yes (appropriate for developmental age) Does the patient have difficulty walking or climbing stairs?: Yes Weakness of Legs: Both Weakness of Arms/Hands: None  Permission Sought/Granted Permission sought to share information with : Case Manager Permission granted to share information with : Yes, Verbal Permission Granted              Emotional Assessment Appearance:: Appears stated age Attitude/Demeanor/Rapport: Gracious Affect (typically observed): Accepting, Calm Orientation: : Oriented to Self, Oriented to Place, Oriented to  Time, Oriented to Situation Alcohol / Substance Use: Not Applicable Psych Involvement: No (comment)  Admission diagnosis:  Hypokalemia [E87.6] Paroxysmal atrial fibrillation (HCC) [I48.0] Hypotension, unspecified hypotension type [I95.9] Dyspnea, unspecified type [R06.00] New onset a-fib [facility] [I48.91] Patient Active Problem List   Diagnosis Date Noted  . Atrial fibrillation with RVR ([place]) 10/16/2018  . Hypotension (arterial) 10/16/2018  . New onset a-fib ([place]) 10/16/2018  . Shortness of breath   . Pulmonary hypertension, unspecified (  [place])   . Malnutrition of moderate degree 10/01/2018  . Hyponatremia  09/29/2018  . Essential hypertension 09/29/2018  . Lower extremity edema 09/29/2018  . Dysphagia 09/29/2018  . ILD (interstitial lung disease) ([place]) 09/18/2018  . Pulmonary infiltrates on CXR 08/28/2018   PCP:  Isaac Bliss, Rayford Halsted, MD Pharmacy:   [facility] [place], [address] [place] [address] Phone: [phone] Fax: [phone]     Social Determinants of Health (SDOH) Interventions    Readmission Risk Interventions 30 Day Unplanned Readmission Risk Score     ED to Hosp-Admission (Discharged) from 09/29/2018 in [place] 6 EAST ONCOLOGY  30 Day Unplanned Readmission Risk Score (%)  17 Filed at 10/05/2018 1200     This score is the patient's risk of an unplanned readmission within 30 days of being discharged (0 -100%). The score is based on dignosis, age, lab data, medications, orders, and past utilization.   Low:  0-14.9   Medium: 15-21.9   High: 22-29.9   Extreme: 30 and above       No flowsheet data found.

## 2018-10-17 NOTE — Progress Notes (Signed)
PT Cancellation Note  Patient Details Name: Sabrina Douglas MRN: [id number] DOB: 02-Jun-1941   Cancelled Treatment:    Reason Eval/Treat Not Completed: PT screened, no acute PT needs identified, will sign off. Discussed pt case with OT who states that pt is near baseline of function. Plan is to d/c today with resumption of home health services. Pt has all necessary DME at home. If needs change, please reconsult.    Marylynn Pearson 10/17/2018, 11:45 AM   Conni Slipper, PT, DPT Acute Rehabilitation Services Pager: [phone] Office: [phone]

## 2018-10-17 NOTE — Telephone Encounter (Signed)
Received fax confirming appointment with [facility] Rheumatology with Alben Deeds, MD on 10/25/2018 at 1:30pm.

## 2018-10-17 NOTE — Progress Notes (Signed)
Discharged via wheelchair/cart with daughter.

## 2018-10-17 NOTE — TOC Benefit Eligibility Note (Signed)
Transition of Care [facility]) Benefit Eligibility Note    Patient Details  Name: Liesha Clinton MRN: [id number] Date of Birth: [birth year]   Medication/Dose: ELIQUIS  5 MG BID    AND ELIQUIS  2.5 MG BID  Covered?: Yes  Tier: 2 Drug  Prescription Coverage [facility]: [facility]  Spoke with Person/Company/Phone Number:: KEISHA  @ [facility] # [phone]  Co-Pay: $ 38.70 EACH PRESCRIPTION   Prior Approval: No     Additional Notes: 90 DAY SUPPLY FOR M/O $ 80.00    Mardene Sayer Phone Number: 10/17/2018, 3:04 PM

## 2018-10-17 NOTE — Care Management CC44 (Signed)
Condition Code 44 Documentation Completed  Patient Details  Name: Sabrina Douglas MRN: [id number] Date of Birth: 02-26-1941   Condition Code 44 given:  Yes Patient signature on Condition Code 44 notice:  Yes Documentation of 2 MD's agreement:  Yes Code 44 added to claim:  Yes    Colleen Can RN, BSN, NCM-BC, ACM-RN [phone] 10/17/2018, 11:57 AM

## 2018-10-17 NOTE — Care Management Obs Status (Signed)
MEDICARE OBSERVATION STATUS NOTIFICATION   Patient Details  Name: Sabrina Douglas MRN: [id number] Date of Birth: Jan 25, 1941   Medicare Observation Status Notification Given:  Yes    Colleen Can RN, BSN, NCM-BC, ACM-RN [phone] 10/17/2018, 11:56 AM

## 2018-10-17 NOTE — Progress Notes (Addendum)
SATURATION QUALIFICATIONS: (This note is used to comply with regulatory documentation for home oxygen)  Patient Saturations on Room Air at Rest = 98%  Patient Saturations on Room Air while Ambulating = 79%  Patient Saturations on 2 Liters of oxygen while Ambulating = 96%  Please briefly explain why patient needs home oxygen: oxygen saturation desat while patient ambulate from bed to toilet and then in hallway.

## 2018-10-18 ENCOUNTER — Ambulatory Visit: Payer: Medicare Other | Admitting: Pulmonary Disease

## 2018-10-18 ENCOUNTER — Other Ambulatory Visit: Payer: Self-pay | Admitting: Physician Assistant

## 2018-10-18 ENCOUNTER — Other Ambulatory Visit: Payer: Self-pay

## 2018-10-18 ENCOUNTER — Encounter: Payer: Self-pay | Admitting: Pulmonary Disease

## 2018-10-18 ENCOUNTER — Other Ambulatory Visit: Payer: Self-pay | Admitting: Cardiology

## 2018-10-18 DIAGNOSIS — I480 Paroxysmal atrial fibrillation: Secondary | ICD-10-CM

## 2018-10-18 DIAGNOSIS — J849 Interstitial pulmonary disease, unspecified: Secondary | ICD-10-CM | POA: Diagnosis not present

## 2018-10-18 DIAGNOSIS — J9611 Chronic respiratory failure with hypoxia: Secondary | ICD-10-CM | POA: Diagnosis not present

## 2018-10-18 NOTE — Progress Notes (Signed)
   Subjective:    Patient ID: Sabrina Douglas, female    DOB: 1941/05/01, 78 y.o.   MRN: [id number]  HPI 78 year old never smoker for follow-up of ILD Radiological review shows bilateral pulmonary infiltrates  have improved compared to October 2019 and today's film but have mostly persisted.  She seems to have had a lingering cough from prior to episode of pneumonia in October  Chief Complaint  Patient presents with  . Hospitalization Follow-up    Has been in the hospital twice since last visit. Was diagnosed with afib. States her breathing is much better.    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  Echo showed pulmonary hypertension and she was admitted 3/9 and underwent right heart cath resulted below.  Prior to this she was hospitalized for hyponatremia She was hospitalized 3/12 for dyspnea, CT angiogram was negative for pulmonary bothersome.  She had new onset atrial fibrillation with RVR which was rate controlled with amiodarone and metoprolol, started on Eliquis  She is anxious regarding her symptoms and tearful during the interview.  She has been provided oxygen with which she is compliant and this is helping She has lost 20 pounds from 161 to her current weight of 140 pounds  On the ILD questionnaire she reports heartburn.  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  As an adult she worked in [employer] tests/ events reviewed  ANA positive 1: 1280, Sm Ab 2.0, RNP > 8.0 ,CCP negative  10/02/2018 PFTs ratio 92, moderate to severe restriction FEV1 66%, FVC 56%, TLC 46%, DLCO 17% corrects to 51% for alveolar volume  Echo 09/2018 RVSP 67 Cath 10/2018 RA 3, PA 39/7 , PVR2.1 Wu  HRCT 09/10/2018 -probable UIP 09/2018 VQ scan very low probability Esophagram 09/2018 smooth narrowing of distal esophagus?  Benign stricture   Past Medical History:  Diagnosis Date  . Anxiety   .  Depression   . Hyperlipidemia      Review of Systems neg for any significant sore throat, dysphagia, itching, sneezing, nasal congestion or excess/ purulent secretions, fever, chills, sweats, unintended wt loss, pleuritic or exertional cp, hempoptysis, orthopnea pnd or change in chronic leg swelling. Also denies presyncope, palpitations, heartburn, abdominal pain, nausea, vomiting, diarrhea or change in bowel or urinary habits, dysuria,hematuria, rash, arthralgias, visual complaints, headache, numbness weakness or ataxia. c/ of spasm of her fingers    Objective:   Physical Exam   Gen. [place], obese, in no distress, normal affect ENT - no pallor,icterus, no post nasal drip, class 2-3 airway Neck: No JVD, no thyromegaly, no carotid bruits Lungs: no use of accessory muscles, no dullness to percussion, bibasal  rales no rhonchi  Cardiovascular: Rhythm regular, heart sounds  normal, no murmurs or gallops, no peripheral edema Abdomen: soft and non-tender, no hepatosplenomegaly, BS normal. Musculoskeletal: No deformities, no cyanosis or clubbing Neuro:  alert, non focal, no tremors        Assessment & Plan:

## 2018-10-18 NOTE — Assessment & Plan Note (Signed)
Continue 2 L O2 portable concentrator. She would qualify for pulmonary rehab and I have cleared her to start once she settles down with medications

## 2018-10-18 NOTE — Patient Instructions (Signed)
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  Await Dr. Dierdre Forth consultation. Address right shoulder with PCP. Okay to take Mucinex 600 mg daily as needed for coughing

## 2018-10-18 NOTE — Assessment & Plan Note (Signed)
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 She has an upcoming appointment with Dr. Dierdre Forth on 3/19 she will likely need immunosuppressants but will await rheumatology consultation

## 2018-10-19 ENCOUNTER — Telehealth: Payer: Self-pay

## 2018-10-19 ENCOUNTER — Telehealth: Payer: Self-pay | Admitting: *Deleted

## 2018-10-19 ENCOUNTER — Telehealth: Payer: Self-pay | Admitting: Internal Medicine

## 2018-10-19 NOTE — Telephone Encounter (Signed)
Copied from CRM [id number]. Topic: Quick Communication - Home Health Verbal Orders >> Oct 19, 2018  1:51 PM Percival Spanish wrote: Olen Pel with [facility]   Callback Number [phone]   Requesting resumption of care following being hospitalized

## 2018-10-19 NOTE — Telephone Encounter (Signed)
Transition Care Management Follow-up Telephone Call   Date discharged? 10/17/2018   How have you been since you were released from the hospital? I've been alright   Do you understand why you were in the hospital? yes   Do you understand the discharge instructions? no,  Patient reports she did'nt read them her daughter takes care of those    Where were you discharged to? Home    Items Reviewed:  Medications reviewed: yes  Allergies reviewed: yes  Dietary changes reviewed: N/A   Referrals reviewed: yes   Functional Questionnaire:   Activities of Daily Living (ADLs):   She states they are independent in the following: ambulation, feeding, continence and toileting States they require assistance with the following: bathing and hygiene and dressing   Any transportation issues/concerns?: no   Any patient concerns? Yes, dizziness from low BP and arm pain    Confirmed importance and date/time of follow-up visits scheduled yes  Provider Appointment booked with  Confirmed with patient if condition begins to worsen call PCP or go to the ER.  Patient was given the office number and encouraged to call back with question or concerns.  : yes  Questions for screening COVID:  Symptom onset: NONE   Travel or Contacts: NONE   Symptoms: . Fever/chills (can be subjective fever) . Cough (dry or productive) . SOB (with or without wheeze) . CP . Weakness/lightheaded . Abd pain, N/V/D  Risks: including COPD/asthma, smoker, heart disease, diabetes, immunocompromised, diabetes, frail, pregnant, liver or kidney disease.  Plan:  . High risk with red flags go to ED (with CP, SOB, weak/lightheaded, or fever > 101.5). Call ahead.  . High risk but stable will have car visit. Inform provider and coordinate time. Will be completed in afternoon. . No red flags but URI s/s will go through side door and be seen in dedicated room.   Note: Referral to telemedicine is an appropriate  alternative disposition for higher risk but stable. [facility] Tele[facility]/e-Visit: [phone]

## 2018-10-19 NOTE — Telephone Encounter (Signed)
Ok to resume.

## 2018-10-19 NOTE — Telephone Encounter (Unsigned)
Copied from CRM [id number]. Topic: Quick Communication - Home Health Verbal Orders >> Oct 19, 2018  1:51 PM Percival Spanish wrote: Olen Pel with [facility]   Callback Number [phone]   Requesting resumption of care following being hospitalized  Frequency: ***

## 2018-10-23 ENCOUNTER — Telehealth: Payer: Self-pay | Admitting: Internal Medicine

## 2018-10-23 ENCOUNTER — Inpatient Hospital Stay: Payer: Medicare Other | Admitting: Internal Medicine

## 2018-10-23 NOTE — Telephone Encounter (Signed)
We can address tomorrow during office visit

## 2018-10-23 NOTE — Telephone Encounter (Signed)
Copied from CRM [id number]. Topic: Quick Communication - Rx Refill/Question >> Oct 23, 2018  8:18 AM Maia Petties wrote: Medication: nystatin (MYCOSTATIN) 100000 UNIT/ML suspension - pt has been dealing with thrush since November (intermittently). The hospital prescribed nystatin previously to help. The hospital told pt/her daughter that she had thrush down her throat/esophagus. Is there something that may be more effective for treating thrush? Is it safe to take with the new medications prescribed by the hospitalist (eliquis, magnesium oxide, metoprolol tartrate, and potassium chloride). Pt has appt tomorrow with Dr. Ardyth Harps. Please call [name] to advise.   Has the patient contacted their pharmacy? No - need new RX Preferred Pharmacy (with phone number or street name): [id number] [place], [place] - [address] AT [facility] & [address] [phone] (Phone) [phone] (Fax)

## 2018-10-24 ENCOUNTER — Encounter (HOSPITAL_COMMUNITY): Payer: Self-pay | Admitting: Internal Medicine

## 2018-10-24 ENCOUNTER — Other Ambulatory Visit: Payer: Self-pay

## 2018-10-24 ENCOUNTER — Ambulatory Visit (HOSPITAL_COMMUNITY)
Admission: RE | Admit: 2018-10-24 | Discharge: 2018-10-24 | Disposition: A | Discharge status: Home / Self Care | Payer: Medicare Other | Source: Ambulatory Visit | Attending: Internal Medicine | Admitting: Internal Medicine

## 2018-10-24 VITALS — BP 110/88 | HR 94 | Ht 64.5 in | Wt 139.6 lb

## 2018-10-24 DIAGNOSIS — J9611 Chronic respiratory failure with hypoxia: Secondary | ICD-10-CM | POA: Diagnosis not present

## 2018-10-24 DIAGNOSIS — F329 Major depressive disorder, single episode, unspecified: Secondary | ICD-10-CM | POA: Insufficient documentation

## 2018-10-24 DIAGNOSIS — I272 Pulmonary hypertension, unspecified: Secondary | ICD-10-CM | POA: Insufficient documentation

## 2018-10-24 DIAGNOSIS — I4510 Unspecified right bundle-branch block: Secondary | ICD-10-CM | POA: Insufficient documentation

## 2018-10-24 DIAGNOSIS — Z833 Family history of diabetes mellitus: Secondary | ICD-10-CM | POA: Insufficient documentation

## 2018-10-24 DIAGNOSIS — J8410 Pulmonary fibrosis, unspecified: Secondary | ICD-10-CM | POA: Insufficient documentation

## 2018-10-24 DIAGNOSIS — I10 Essential (primary) hypertension: Secondary | ICD-10-CM | POA: Insufficient documentation

## 2018-10-24 DIAGNOSIS — I4891 Unspecified atrial fibrillation: Secondary | ICD-10-CM

## 2018-10-24 DIAGNOSIS — Z8042 Family history of malignant neoplasm of prostate: Secondary | ICD-10-CM | POA: Diagnosis not present

## 2018-10-24 DIAGNOSIS — Z79899 Other long term (current) drug therapy: Secondary | ICD-10-CM | POA: Insufficient documentation

## 2018-10-24 DIAGNOSIS — F419 Anxiety disorder, unspecified: Secondary | ICD-10-CM | POA: Insufficient documentation

## 2018-10-24 DIAGNOSIS — R9431 Abnormal electrocardiogram [ECG] [EKG]: Secondary | ICD-10-CM | POA: Insufficient documentation

## 2018-10-24 DIAGNOSIS — I480 Paroxysmal atrial fibrillation: Secondary | ICD-10-CM | POA: Diagnosis not present

## 2018-10-24 DIAGNOSIS — Z7901 Long term (current) use of anticoagulants: Secondary | ICD-10-CM | POA: Insufficient documentation

## 2018-10-24 LAB — BASIC METABOLIC PANEL
Anion gap: 10 (ref 5–15)
BUN: 9 mg/dL (ref 8–23)
CO2: 36 mmol/L — ABNORMAL HIGH (ref 22–32)
Calcium: 9.7 mg/dL (ref 8.9–10.3)
Chloride: 93 mmol/L — ABNORMAL LOW (ref 98–111)
Creatinine, Ser: 0.79 mg/dL (ref 0.44–1.00)
GFR calc Af Amer: >60 mL/min (ref >60)
GFR calc non Af Amer: >60 mL/min (ref >60)
Glucose, Bld: 96 mg/dL (ref 70–99)
Potassium: 4.3 mmol/L (ref 3.5–5.1)
Sodium: 139 mmol/L (ref 135–145)

## 2018-10-24 LAB — MAGNESIUM: Magnesium: 2.1 mg/dL (ref 1.7–2.4)

## 2018-10-24 NOTE — Patient Instructions (Addendum)
It was great to see you today! No medication changes are needed at this time.   Labs today We will only contact you if something comes back abnormal or we need to make some changes. Otherwise no news is good news!   Your provider has recommended that  you wear a Zio Patch for 14 days.  This monitor will record your heart rhythm for our review.  IF you have any symptoms while wearing the monitor please press the button.  If you have any issues with the patch or you notice a red or orange light on it please call the company at [phone].  Once you remove the patch please mail it back to the company as soon as possible so we can get the results.   Keep your follow up as scheduled with Dr Gala Romney

## 2018-10-24 NOTE — Progress Notes (Signed)
Advanced Heart Failure Clinic Note   Referring Physician: PCP: Isaac Bliss, Rayford Halsted, MD PCP-Cardiologist: No primary care provider on file.   HPI:  Sabrina Douglas is a 78 y.o. female with h/o hypertension, hyperlipidemia, anxiety/depression referred by Dr. 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  Patient seen for initial consult with Dr. [name] on 08/28/18. CXR showed mildly worsened bibasilar pneumonia, suggestive of atypical infection d/t waxing/weaning symptoms. 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 Ordered for HRCT, concern for ILD. RX short course prednisone. ANA positive, CCP negative. 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   Admitted 2/26-2/28 with severe weakness. Fount to have hyponatremia at 114, dysphagia and about 20 pounds weight loss in the months. Treated with 3% saline and eventually demeclocycline. Prior to d/c had VQ and echo as above. Tested for need for home O2 and was told she didn't need it.   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.   Pt admitted 3/10 - 10/17/18 with new onset Afib. Converted back to NSR with amiodarone drip and was discontinued. Toprol resumed for home and Holter monitor ordered. Eliquis started.  She presents today for post hospital follow up. Feeling OK overall. She can do laps around (inside) her house 5-6 times, before she has to sit and stop. She does sit to stands 10-12 times, without having to stop. She has mild SOB changing her clothes or bathing. Doesn't have much of an appetite. Her weight continues to gradually trend down at home. Wearing O2 now, but not at night. She does have orthopnea, she has been sleeping in a recliner for the past 3-4 days. She tends to get SOB with long conversations. She states she was dizzy getting up in the mornings the first two days after starting her oxygen. She did also take a 10 mg dose of melatonin, and 5 the next day.  She has  long h/o + Raynaud's. + long h/o eczema. Mild arthritis in R knee. No other arthralgias.   RHC 10/15/18 RA = 3 RV = 39/4 PA = 39/7 (18) PCW = 4 Fick cardiac output/index = 6.5/3.8 PVR = 2.1 WU Ao sat = 100% PA sat = 81%, 82% High SVC and axillary sats both 81%  Assessment: 1. Normal pulmonary pressures 2. High output physiology without evidence of intracardiac shunting   Labs  ANA 1:1280 (pos) ESR 104  RF 55 (pos) RNP > 8.0  (pos) AntiJo negative CCP < 16 Anti-Smith 2.0 (pos) Scl-70 < 1.0 ANCA negative  HRCT 09/10/18 1. 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 Repeat high-resolution chest CT is recommended in 12 months to assess for temporal changes in the appearance of the lung parenchyma. 2. Aortic atherosclerosis.  Echo 09/30/18 1. The left ventricle has hyperdynamic systolic function, with an ejection fraction of >65%. The cavity size was normal. Left ventricular diastolic parameters were normal No evidence of left ventricular regional wall motion abnormalities. 2. No evidence of left ventricular regional wall motion abnormalities. 3. The right ventricle has normal systolic function. The cavity was grossly normal. RA-RV peak gradient increased at 66.9 mmHg consistent with probable severe pulmonary hypertension. Could not estimate CVP however. 4. The aortic valve is tricuspid Mild aortic annular calcification noted. 5. The mitral valve is normal in structure. 6. The tricuspid valve is normal in structure. Tricuspid valve regurgitation is   PFTs  (  10/02/18) FEV1 1.13 (66%) FVC   1.23 (56%) DLCO 16%  VQ scan 10/03/18: Negative  Review of systems complete and found to be negative unless listed in HPI.    Past Medical History:  Diagnosis Date  . Anxiety   . Depression   . Hyperlipidemia     Current Outpatient Medications  Medication Sig  Dispense Refill  . albuterol (PROVENTIL HFA;VENTOLIN HFA) 108 (90 Base) MCG/ACT inhaler Inhale 2 puffs into the lungs every 6 (six) hours as needed for wheezing or shortness of breath. 1 Inhaler 6  . ALPRAZolam (XANAX) 0.25 MG tablet Take 0.125-0.25 mg by mouth 2 (two) times daily as needed for anxiety.     Marland Kitchen apixaban (ELIQUIS) 5 MG TABS tablet Take 1 tablet (5 mg total) by mouth 2 (two) times daily. 60 tablet 0  . demeclocycline (DECLOMYCIN) 150 MG tablet Take 1 tablet (150 mg total) by mouth 2 (two) times daily. 60 tablet 0  . furosemide (LASIX) 40 MG tablet Take 1 tablet (40 mg total) by mouth daily. 30 tablet 3  . Glycerin-Hypromellose-PEG 400 ([facility] DRY EYE RELIEF) 0.2-0.2-1 % SOLN Place 2 drops into both eyes as needed (Dry eyes).    . magnesium oxide (MAG-OX) 400 (241.3 Mg) MG tablet Take 1 tablet (400 mg total) by mouth daily. 30 tablet 0  . metoprolol tartrate (LOPRESSOR) 25 MG tablet Take 0.5 tablets (12.5 mg total) by mouth 2 (two) times daily. 180 tablet 1  . Multiple Vitamin (MULTIVITAMIN WITH MINERALS) TABS tablet Take 1 tablet by mouth daily.    . pantoprazole (PROTONIX) 40 MG tablet Take 40 mg by mouth daily.    . potassium chloride SA (K-DUR,KLOR-CON) 20 MEQ tablet Take 1 tablet (20 mEq total) by mouth daily. 30 tablet 0  . Respiratory Therapy Supplies (FLUTTER) DEVI 1 Device by Does not apply route as needed. 1 each 0   No current facility-administered medications for this encounter.    No Known Allergies  Social History   Socioeconomic History  . Marital status: Widowed    Spouse name: Not on file  . Number of children: Not on file  . Years of education: Not on file  . Highest education level: Not on file  Occupational History  . Not on file  Social Needs  . Financial resource strain: Not on file  . Food insecurity:    Worry: Not on file    Inability: Not on file  . Transportation needs:    Medical: Not on file    Non-medical: Not on file  Tobacco Use  . Smoking  status: Never Smoker  . Smokeless tobacco: Never Used  Substance and Sexual Activity  . Alcohol use: Never    Frequency: Never  . Drug use: Not on file  . Sexual activity: Not on file  Lifestyle  . Physical activity:    Days per week: Not on file    Minutes per session: Not on file  . Stress: Not on file  Relationships  . Social connections:    Talks on phone: Not on file    Gets together: Not on file    Attends religious service: Not on file    Active member of club or organization: Not on file    Attends meetings of clubs or organizations: Not on file    Relationship status: Not on file  . Intimate partner violence:    Fear of current or ex partner: Not on file    Emotionally abused: Not on file  Physically abused: Not on file    Forced sexual activity: Not on file  Other Topics Concern  . Not on file  Social History Narrative  . Not on file    Family History  Problem Relation Age of Onset  . Sudden death Mother 76       sudden death post hysterectomy  . Prostate cancer Father   . Diabetes Father    Vitals:   10/24/18 1118  BP: 110/88  Pulse: 94  Weight: 63.3 kg (139 lb 9.6 oz)  Height: 5' 4.5" (1.638 m)   Wt Readings from Last 3 Encounters:  10/24/18 63.3 kg (139 lb 9.6 oz)  10/18/18 63.5 kg (140 lb)  10/17/18 65.2 kg (143 lb 11.8 oz)    PHYSICAL EXAM: General:  Elderly appearing. NAD.  HEENT: Normal, O2 via [place]ck: supple. no JVD. Carotids 2+ bilat; no bruits. No lymphadenopathy or thyromegaly appreciated. Cor: PMI nondisplaced. Regular, slightly tachy. No rubs, gallops or murmurs. Lungs: clear Abdomen: soft, nontender, nondistended. No hepatosplenomegaly. No bruits or masses. Good bowel sounds. Extremities: no cyanosis, clubbing, rash, edema Neuro: alert & oriented x 3, cranial nerves grossly intact. moves all 4 extremities w/o difficulty. Affect pleasant.  ECG: NSR 98 bpm, QRS 118 ms, PR 140 ms, personally reviewed  ASSESSMENT & PLAN:  1.  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  - VQ is negative. - RHC 10/15/18 with normal pulmonary pressures and high output physiology without evidence of intracardiac shunting  - Has been referred to Dr Marijean Bravo  2. Chronic hypoxic respiratory failure - likely due to [facility] and ILD  - Continue home O2. - Referred to Pulmonary rehab.  - Continue to follow with Pulmonary.   3. Afib, paroxysmal - Recently diagnosed. - EKG today shows NSR 98 bpm, personally reviewed.  - Continue Eliquis 5 mg BID - likely can decrease to 2.5 BID when she is > 35 y/o if weight continues to drop  - She was scheduled for Holter Monitor, She is very high risk for recurrent Afib, so low yield. - This patients CHA2DS2-VASc is 4.   Shirley Friar, PA-C 10/24/18   Patient seen and examined with the above-signed Advanced Practice Provider and/or Housestaff. I personally reviewed laboratory data, imaging studies and relevant notes. I independently examined the patient and formulated the important aspects of the plan. I have edited the note to reflect any of my changes or salient points. I have personally discussed the plan with the patient and/or family.  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 Recent RHC with surprisingly normal pulmonary pressures despite echo results. Recently admitted with PAF that spontaneously converted. Now maintaining NSR. Will continue Eliquis. Place 14 day zio patch to look for overall burden. Has appointment with Rheum tomorrow to further assess CTD. Consider repeat RHC in 12 months to reassess pulmonary pressures.   Total time spent 35  minutes. Over half that time spent discussing above.    Glori Bickers, MD  2:16 PM

## 2018-10-25 ENCOUNTER — Telehealth (HOSPITAL_COMMUNITY): Payer: Self-pay

## 2018-10-25 ENCOUNTER — Ambulatory Visit: Payer: Medicare Other | Admitting: Internal Medicine

## 2018-10-25 ENCOUNTER — Other Ambulatory Visit (HOSPITAL_COMMUNITY): Payer: Self-pay | Admitting: *Deleted

## 2018-10-25 MED ORDER — APIXABAN 5 MG PO TABS: 5.0000 mg | ORAL_TABLET | Freq: Two times a day (BID) | ORAL | 3 refills | Status: DC

## 2018-10-25 NOTE — Telephone Encounter (Signed)
Attempted to call patient in regards to Pulmonary Rehab - to let pt know we are closed at this time due to the COVID-19 and will contact once we have resume scheduling.  ° ° °Unable to leave a voicemail °

## 2018-10-25 NOTE — Telephone Encounter (Signed)
Pt daughter [name] returned phone, stated pt would like to participate in PR. Adv at this time we are not scheduling due to the COVID-19. Once we have resume scheduling we will contact the pt. She verbalized understanding.

## 2018-10-31 LAB — BASIC METABOLIC PANEL
BUN: 14 (ref 4–21)
Glucose: 143
Potassium: 5.1 (ref 3.4–5.3)
Sodium: 138 (ref 137–147)

## 2018-11-01 ENCOUNTER — Encounter: Payer: Self-pay | Admitting: Internal Medicine

## 2018-11-02 ENCOUNTER — Encounter: Payer: Self-pay | Admitting: Internal Medicine

## 2018-11-06 ENCOUNTER — Other Ambulatory Visit: Payer: Self-pay

## 2018-11-06 ENCOUNTER — Ambulatory Visit (INDEPENDENT_AMBULATORY_CARE_PROVIDER_SITE_OTHER): Payer: Medicare Other | Admitting: Internal Medicine

## 2018-11-06 DIAGNOSIS — E440 Moderate protein-calorie malnutrition: Secondary | ICD-10-CM

## 2018-11-06 DIAGNOSIS — J849 Interstitial pulmonary disease, unspecified: Secondary | ICD-10-CM

## 2018-11-06 DIAGNOSIS — E871 Hypo-osmolality and hyponatremia: Secondary | ICD-10-CM | POA: Diagnosis not present

## 2018-11-06 DIAGNOSIS — R634 Abnormal weight loss: Secondary | ICD-10-CM

## 2018-11-06 DIAGNOSIS — J9611 Chronic respiratory failure with hypoxia: Secondary | ICD-10-CM

## 2018-11-06 DIAGNOSIS — I4891 Unspecified atrial fibrillation: Secondary | ICD-10-CM

## 2018-11-06 NOTE — Progress Notes (Signed)
Virtual Visit via Video Note  I connected with Sabrina Douglas on 11/06/18 at 11:00 AM EDT by a video enabled telemedicine application and verified that I am speaking with the correct person using two identifiers.  Location patient: home Location provider: work office Persons participating in the virtual visit: patient, provider, daughter [name] who assists with technology and information  I discussed the limitations of evaluation and management by telemedicine and the availability of in person appointments. The patient expressed understanding and agreed to proceed.   HPI: Patient has had a very busy month of March.  She was hospitalized and ended up having new onset atrial fibrillation.  Because of low normal blood pressures her metoprolol dose was cut in half from 25 to 12.5 mg twice daily and she was started on Eliquis.  Recent blood pressures at home are as follows: 94/68, 114/83, 107/83, 114/92.  She also has interstitial lung disease with chronic hypoxemic respiratory failure.  She has recently been thoroughly worked up by cardiology, pulmonary and rheumatology.  Rheumatology has started her on hydroxychloroquine as well as prednisone 10 mg twice daily.  Patient and daughter states that they have most definitely noticed an improvement on these medications, to the point where she is no longer getting as dyspneic on exertion as before, is now only using oxygen as needed with activity as opposed to 24/7 as before.  She had been diagnosed with SIADH and was hospitalized due to hyponatremia in the recent past.  She is no longer doing fluid restriction and nephrology took her off demeclocycline with follow-up sodium level that was normal at 139.  Patient and daughter are concerned about weight loss.  In October 2019 she weighed 173 pounds and now weighs 130.  Her appetite has not been great.  We have reviewed a a food diary: For breakfast she will have 3 to 4 tablespoons of grits with 2 slices  of bacon and a half of a fruit cup, she will have maybe half an apple with a tablespoon of peanut butter for snack, a bite or 2 of chicken with vegetables for dinner and then she will have 1 protein shake throughout the day.  She feels like she is doing better and is feeling stronger.   ROS: Constitutional: Denies fever, chills, diaphoresis, appetite change and fatigue.  HEENT: Denies photophobia, eye pain, redness, hearing loss, ear pain, congestion, sore throat, rhinorrhea, sneezing, mouth sores, trouble swallowing, neck pain, neck stiffness and tinnitus.   Respiratory: Denies cough, chest tightness,  and wheezing.   Cardiovascular: Denies chest pain, palpitations and leg swelling.  Gastrointestinal: Denies nausea, vomiting, abdominal pain, diarrhea, constipation, blood in stool and abdominal distention.  Genitourinary: Denies dysuria, urgency, frequency, hematuria, flank pain and difficulty urinating.  Endocrine: Denies: hot or cold intolerance, sweats, changes in hair or nails, polyuria, polydipsia. Musculoskeletal: Denies myalgias, back pain, joint swelling, arthralgias and gait problem.  Skin: Denies pallor, rash and wound.  Neurological: Denies dizziness, seizures, syncope, light-headedness, numbness and headaches.  Hematological: Denies adenopathy. Easy bruising, personal or family bleeding history  Psychiatric/Behavioral: Denies suicidal ideation, mood changes, confusion, nervousness, sleep disturbance and agitation   Past Medical History:  Diagnosis Date  . Anxiety   . Depression   . Hyperlipidemia     Past Surgical History:  Procedure Laterality Date  . BALLOON DILATION N/A 10/04/2018   Procedure: BALLOON DILATION;  Surgeon: Kathi Der, MD;  Location: [facility];  Service: Gastroenterology;  Laterality: N/A;  . BIOPSY  10/04/2018  Procedure: BIOPSY;  Surgeon: Kathi Der, MD;  Location: [facility];  Service: Gastroenterology;;  . ESOPHAGOGASTRODUODENOSCOPY  (EGD) WITH PROPOFOL N/A 10/04/2018   Procedure: ESOPHAGOGASTRODUODENOSCOPY (EGD) WITH PROPOFOL;  Surgeon: Kathi Der, MD;  Location: [facility];  Service: Gastroenterology;  Laterality: N/A;  . RIGHT HEART CATH N/A 10/15/2018   Procedure: RIGHT HEART CATH;  Surgeon: Dolores Patty, MD;  Location: [facility];  Service: Cardiovascular;  Laterality: N/A;    Family History  Problem Relation Age of Onset  . Sudden death Mother 61       sudden death post hysterectomy  . Prostate cancer Father   . Diabetes Father     SOCIAL HX:   reports that she has never smoked. She has never used smokeless tobacco. She reports that she does not drink alcohol. No history on file for drug.   Current Outpatient Medications:  .  albuterol (PROVENTIL HFA;VENTOLIN HFA) 108 (90 Base) MCG/ACT inhaler, Inhale 2 puffs into the lungs every 6 (six) hours as needed for wheezing or shortness of breath., Disp: 1 Inhaler, Rfl: 6 .  ALPRAZolam (XANAX) 0.25 MG tablet, Take 0.125-0.25 mg by mouth 2 (two) times daily as needed for anxiety. , Disp: , Rfl:  .  apixaban (ELIQUIS) 5 MG TABS tablet, Take 1 tablet (5 mg total) by mouth 2 (two) times daily., Disp: 60 tablet, Rfl: 3 .  furosemide (LASIX) 40 MG tablet, Take 1 tablet (40 mg total) by mouth daily., Disp: 30 tablet, Rfl: 3 .  Glycerin-Hypromellose-PEG 400 ([facility] DRY EYE RELIEF) 0.2-0.2-1 % SOLN, Place 2 drops into both eyes as needed (Dry eyes)., Disp: , Rfl:  .  magnesium oxide (MAG-OX) 400 (241.3 Mg) MG tablet, Take 1 tablet (400 mg total) by mouth daily., Disp: 30 tablet, Rfl: 0 .  metoprolol tartrate (LOPRESSOR) 25 MG tablet, Take 0.5 tablets (12.5 mg total) by mouth 2 (two) times daily., Disp: 180 tablet, Rfl: 1 .  Multiple Vitamin (MULTIVITAMIN WITH MINERALS) TABS tablet, Take 1 tablet by mouth daily., Disp: , Rfl:  .  pantoprazole (PROTONIX) 40 MG tablet, Take 40 mg by mouth daily., Disp: , Rfl:  .  potassium chloride SA (K-DUR,KLOR-CON) 20 MEQ  tablet, Take 1 tablet (20 mEq total) by mouth daily., Disp: 30 tablet, Rfl: 0 .  Respiratory Therapy Supplies (FLUTTER) DEVI, 1 Device by Does not apply route as needed., Disp: 1 each, Rfl: 0  EXAM:   VITALS per patient if applicable: Blood pressure as reported above  GENERAL: alert, oriented, appears well and in no acute distress  HEENT: atraumatic, conjunttiva clear, no obvious abnormalities on inspection of external nose and ears  NECK: normal movements of the head and neck  LUNGS: on inspection no signs of respiratory distress, breathing rate appears normal, no obvious gross increased work of breathing, gasping or wheezing  CV: no obvious cyanosis  MS: moves all visible extremities without noticeable abnormality  PSYCH/NEURO: pleasant and cooperative, no obvious depression or anxiety, speech and thought processing grossly intact  ASSESSMENT AND PLAN:   Weight loss Malnutrition of moderate degree -We have discussed increasing food consumption, adding two meal replacement shakes like Ensure or boost to her daily intake. -Daughter will weigh patient twice a week and will notify us if she has any significant weight loss.  ILD (interstitial lung disease) (HCC) Chronic respiratory failure with hypoxia (HCC) -Still using 2 L of oxygen as needed, recently started on prednisone and hydroxychloroquine with good results.  Has follow-up with rheumatology in 1  month.  Hyponatremia/SIADH -Improved, on 3/18 sodium was 139 with normal renal function off to meclocycline. -She is being followed by nephrology.  New onset a-fib (HCC) -Is rate controlled on metoprolol 12.5 mg twice daily, rate controlled on Eliquis.       I discussed the assessment and treatment plan with the patient. The patient was provided an opportunity to ask questions and all were answered. The patient agreed with the plan and demonstrated an understanding of the instructions.   The patient was advised to call back  or seek an in-person evaluation if the symptoms worsen or if the condition fails to improve as anticipated.    Chaya Jan, MD  [facility] at [facility]

## 2018-11-08 NOTE — Telephone Encounter (Signed)
Spoke with Alvino Chapel from [facility] and she states that she did not ask for orders. Verbal order not given.

## 2018-11-08 NOTE — Telephone Encounter (Signed)
ok 

## 2018-11-12 ENCOUNTER — Telehealth: Payer: Self-pay | Admitting: *Deleted

## 2018-11-12 NOTE — Telephone Encounter (Signed)
Copied from CRM [id number]. Topic: General - Other >> Nov 12, 2018 11:07 AM Darron Doom wrote: Reason for CRM: Alvino Chapel with [facility] called to say that patient called and ask that they do not show up at her home due to the corona virus. She ask that they do not come until all this is over and things are back to a calm normal. Any questions please call Ph# [phone]

## 2018-11-13 ENCOUNTER — Other Ambulatory Visit (HOSPITAL_COMMUNITY): Payer: Self-pay | Admitting: Internal Medicine

## 2018-11-17 NOTE — Telephone Encounter (Signed)
ok 

## 2018-11-23 ENCOUNTER — Ambulatory Visit (HOSPITAL_COMMUNITY): Payer: Medicare Other | Admitting: Physician Assistant

## 2018-11-23 ENCOUNTER — Encounter (HOSPITAL_COMMUNITY): Payer: Self-pay

## 2018-11-23 ENCOUNTER — Encounter: Payer: Self-pay | Admitting: Internal Medicine

## 2018-11-26 ENCOUNTER — Telehealth: Payer: Self-pay | Admitting: *Deleted

## 2018-11-26 ENCOUNTER — Other Ambulatory Visit: Payer: Self-pay | Admitting: Internal Medicine

## 2018-11-26 DIAGNOSIS — R300 Dysuria: Secondary | ICD-10-CM

## 2018-11-26 NOTE — Telephone Encounter (Signed)
Spoke with Sabrina Douglas.  Sabrina Douglas will get a sample from her mom.  Appointment scheduled.

## 2018-11-26 NOTE — Telephone Encounter (Signed)
Copied from CRM [id number]. Topic: General - Other >> Nov 26, 2018  8:06 AM Sabrina Douglas A wrote: Reason for [name] patient daughter called to say that the patient have a UTI. Complains of vaginal itching and frequent urination. Please advise  [name] Ph# [phone]

## 2018-11-27 ENCOUNTER — Ambulatory Visit (INDEPENDENT_AMBULATORY_CARE_PROVIDER_SITE_OTHER): Payer: Medicare Other | Admitting: Internal Medicine

## 2018-11-27 ENCOUNTER — Other Ambulatory Visit: Payer: Self-pay

## 2018-11-27 DIAGNOSIS — E440 Moderate protein-calorie malnutrition: Secondary | ICD-10-CM | POA: Diagnosis not present

## 2018-11-27 DIAGNOSIS — R300 Dysuria: Secondary | ICD-10-CM | POA: Diagnosis not present

## 2018-11-27 DIAGNOSIS — E871 Hypo-osmolality and hyponatremia: Secondary | ICD-10-CM | POA: Diagnosis not present

## 2018-11-27 LAB — POCT URINALYSIS DIPSTICK
Bilirubin, UA: NEGATIVE
Blood, UA: NEGATIVE
Glucose, UA: NEGATIVE
Ketones, UA: NEGATIVE
Leukocytes, UA: NEGATIVE
Nitrite, UA: NEGATIVE
Protein, UA: NEGATIVE
Spec Grav, UA: 1.01 (ref 1.010–1.025)
Urobilinogen, UA: 0.2 E.U./dL
pH, UA: 7 (ref 5.0–8.0)

## 2018-11-27 LAB — URINALYSIS, ROUTINE W REFLEX MICROSCOPIC
Bilirubin Urine: NEGATIVE
Hgb urine dipstick: NEGATIVE
Ketones, ur: NEGATIVE
Leukocytes,Ua: NEGATIVE
Nitrite: NEGATIVE
RBC / HPF: NONE SEEN (ref 0–?)
Specific Gravity, Urine: 1.01 (ref 1.000–1.030)
Total Protein, Urine: NEGATIVE
Urine Glucose: NEGATIVE
Urobilinogen, UA: 0.2 (ref 0.0–1.0)
WBC, UA: NONE SEEN (ref 0–?)
pH: 7 (ref 5.0–8.0)

## 2018-11-27 MED ORDER — SULFAMETHOXAZOLE-TRIMETHOPRIM 800-160 MG PO TABS: 1.0000 | ORAL_TABLET | Freq: Two times a day (BID) | ORAL | 0 refills | Status: AC

## 2018-11-27 NOTE — Progress Notes (Signed)
Virtual Visit via Video Note  I connected with Sabrina Douglas on 11/27/18 at  9:00 AM EDT by a video enabled telemedicine application and verified that I am speaking with the correct person using two identifiers.  Location patient: home Location provider: work office Persons participating in the virtual visit: patient, provider, daughter assists with technology  I discussed the limitations of evaluation and management by telemedicine and the availability of in person appointments. The patient expressed understanding and agreed to proceed.   HPI: She has scheduled this visit due to dysuria that began 3 days ago, no suprapublic pain, no fever, nausea or vomiting. She has had urgency and increased frequency. She came in yesterday and left a urine sample, dipstick neg for leukocytes or nitrates.  She has gained 6 ponds since we last spoke. She has been eating 3 meals a day and snacks. Daughter is very happy with her progress.  She has SIADH. Daughter would [facility] to draw her electrolyte levels prior to our next meeting a needs an order for this.   ROS: Constitutional: Denies fever, chills, diaphoresis, appetite change and fatigue.  HEENT: Denies photophobia, eye pain, redness, hearing loss, ear pain, congestion, sore throat, rhinorrhea, sneezing, mouth sores, trouble swallowing, neck pain, neck stiffness and tinnitus.   Respiratory: Denies SOB, DOE, cough, chest tightness,  and wheezing.   Cardiovascular: Denies chest pain, palpitations and leg swelling.  Gastrointestinal: Denies nausea, vomiting, abdominal pain, diarrhea, constipation, blood in stool and abdominal distention.  Genitourinary: Denies hematuria, flank pain and difficulty urinating.  Endocrine: Denies: hot or cold intolerance, sweats, changes in hair or nails, polyuria, polydipsia. Musculoskeletal: Denies myalgias, back pain, joint swelling, arthralgias and gait problem.  Skin: Denies pallor, rash and wound.   Neurological: Denies dizziness, seizures, syncope, weakness, light-headedness, numbness and headaches.  Hematological: Denies adenopathy. Easy bruising, personal or family bleeding history  Psychiatric/Behavioral: Denies suicidal ideation, mood changes, confusion, nervousness, sleep disturbance and agitation   Past Medical History:  Diagnosis Date  . Anxiety   . Depression   . Hyperlipidemia     Past Surgical History:  Procedure Laterality Date  . BALLOON DILATION N/A 10/04/2018   Procedure: BALLOON DILATION;  Surgeon: Kathi DerBrahmbhatt, Parag, MD;  Location: [facility];  Service: Gastroenterology;  Laterality: N/A;  . BIOPSY  10/04/2018   Procedure: BIOPSY;  Surgeon: Kathi DerBrahmbhatt, Parag, MD;  Location: [facility];  Service: Gastroenterology;;  . ESOPHAGOGASTRODUODENOSCOPY (EGD) WITH PROPOFOL N/A 10/04/2018   Procedure: ESOPHAGOGASTRODUODENOSCOPY (EGD) WITH PROPOFOL;  Surgeon: Kathi DerBrahmbhatt, Parag, MD;  Location: [facility];  Service: Gastroenterology;  Laterality: N/A;  . RIGHT HEART CATH N/A 10/15/2018   Procedure: RIGHT HEART CATH;  Surgeon: Dolores PattyBensimhon, Daniel R, MD;  [facility];  Service: Cardiovascular;  Laterality: N/A;    Family History  Problem Relation Age of Onset  . Sudden death Mother [id number]       sudden death post hysterectomy  . Prostate cancer Father   . Diabetes Father     SOCIAL HX:   reports that she has never smoked. She has never used smokeless tobacco. She reports that she does not drink alcohol. No history on file for drug.   Current Outpatient Medications:  .  albuterol (PROVENTIL HFA;VENTOLIN HFA) 108 (90 Base) MCG/ACT inhaler, Inhale 2 puffs into the lungs every 6 (six) hours as needed for wheezing or shortness of breath., Disp: 1 Inhaler, Rfl: 6 .  ALPRAZolam (XANAX) 0.25 MG tablet, Take 0.125-0.25 mg by mouth 2 (two)  times daily as needed for anxiety. , Disp: , Rfl:  .  apixaban (ELIQUIS) 5 MG TABS tablet, Take 1 tablet (5 mg total) by mouth 2  (two) times daily., Disp: 60 tablet, Rfl: 3 .  furosemide (LASIX) 40 MG tablet, Take 1 tablet (40 mg total) by mouth daily., Disp: 30 tablet, Rfl: 3 .  Glycerin-Hypromellose-PEG 400 ([facility] DRY EYE RELIEF) 0.2-0.2-1 % SOLN, Place 2 drops into both eyes as needed (Dry eyes)., Disp: , Rfl:  .  magnesium oxide (MAG-OX) 400 (241.3 Mg) MG tablet, Take 1 tablet (400 mg total) by mouth daily., Disp: 30 tablet, Rfl: 0 .  metoprolol tartrate (LOPRESSOR) 25 MG tablet, Take 0.5 tablets (12.5 mg total) by mouth 2 (two) times daily., Disp: 180 tablet, Rfl: 1 .  Multiple Vitamin (MULTIVITAMIN WITH MINERALS) TABS tablet, Take 1 tablet by mouth daily., Disp: , Rfl:  .  pantoprazole (PROTONIX) 40 MG tablet, Take 40 mg by mouth daily., Disp: , Rfl:  .  potassium chloride SA (K-DUR,KLOR-CON) 20 MEQ tablet, Take 1 tablet (20 mEq total) by mouth daily., Disp: 30 tablet, Rfl: 0 .  Respiratory Therapy Supplies (FLUTTER) DEVI, 1 Device by Does not apply route as needed., Disp: 1 each, Rfl: 0 .  sulfamethoxazole-trimethoprim (BACTRIM DS) 800-160 MG tablet, Take 1 tablet by mouth 2 (two) times daily for 3 days., Disp: 6 tablet, Rfl: 0  EXAM:   VITALS per patient if applicable: none reported  GENERAL: alert, oriented, appears well and in no acute distress  HEENT: atraumatic, conjunttiva clear, no obvious abnormalities on inspection of external nose and ears  NECK: normal movements of the head and neck  LUNGS: on inspection no signs of respiratory distress, breathing rate appears normal, no obvious gross increased work of breathing, gasping or wheezing  CV: no obvious cyanosis  MS: moves all visible extremities without noticeable abnormality  PSYCH/NEURO: pleasant and cooperative, no obvious depression or anxiety, speech and thought processing grossly intact  ASSESSMENT AND PLAN:   Dysuria  -Given symptoms will treat empirically with bactrim DS for 3 days. -Send for UA and culture.  Hyponatremia -With H/o  SIADH. -Will send in lab orders to [facility] agency.  Malnutrition of moderate degree -Very impressed with her 6 pound weight gain in 3 weeks.     I discussed the assessment and treatment plan with the patient. The patient was provided an opportunity to ask questions and all were answered. The patient agreed with the plan and demonstrated an understanding of the instructions.   The patient was advised to call back or seek an in-person evaluation if the symptoms worsen or if the condition fails to improve as anticipated.    Chaya Jan, MD  [facility]

## 2018-11-28 LAB — URINE CULTURE
MICRO NUMBER:: 410635
Result:: NO GROWTH
SPECIMEN QUALITY:: ADEQUATE

## 2018-11-30 ENCOUNTER — Telehealth: Payer: Self-pay | Admitting: Internal Medicine

## 2018-11-30 ENCOUNTER — Encounter: Payer: Self-pay | Admitting: Internal Medicine

## 2018-11-30 LAB — LIPID PANEL
Cholesterol: 207 — AB (ref 0–200)
HDL: 64 (ref 35–70)
LDL Cholesterol: 113
Triglycerides: 149 (ref 40–160)

## 2018-11-30 LAB — BASIC METABOLIC PANEL: Glucose: 86

## 2018-11-30 NOTE — Telephone Encounter (Signed)
They were supposed to draw blood work and send to me so I could determine if we need to continue Mg and K. Has this been done?

## 2018-11-30 NOTE — Telephone Encounter (Signed)
Cynthia nurse from [facility] called inquiring if the patient should be taking the Mag-OX and Potassium.  She is requesting a phone call to discuss  [phone]

## 2018-12-04 ENCOUNTER — Telehealth: Payer: Self-pay | Admitting: Internal Medicine

## 2018-12-04 ENCOUNTER — Telehealth (HOSPITAL_COMMUNITY): Payer: Self-pay | Admitting: *Deleted

## 2018-12-04 NOTE — Telephone Encounter (Signed)
Copied from CRM [id number]. Topic: General - Other >> Dec 04, 2018 10:17 AM Fanny Bien wrote: Reason for [id number] calling from [facility] called and stated that she faxed over lab results. She would like to confirm that we received them. Please advise

## 2018-12-04 NOTE — Telephone Encounter (Signed)
Spoke with patient and nurse did come out and blood was drawn.  Called the number given for [place], but it was incorrect.  Called the office and they will fax the results to the office.

## 2018-12-04 NOTE — Telephone Encounter (Signed)
Copied from CRM [id number]. Topic: General - Other >> Dec 04, 2018 10:19 AM Tamela Oddi wrote: Reason for CRM: Arline Asp with [facility] called to speak with the nurse regarding the patient's medication.  She stated that the patient does not know if she should continue to take the medication.  Please advise and call back at [phone]

## 2018-12-06 ENCOUNTER — Encounter: Payer: Self-pay | Admitting: Internal Medicine

## 2018-12-07 NOTE — Telephone Encounter (Signed)
See phone note

## 2018-12-07 NOTE — Telephone Encounter (Signed)
Labs received and reviewed by Dr Ardyth Harps with daughter.

## 2018-12-12 ENCOUNTER — Encounter: Payer: Self-pay | Admitting: Internal Medicine

## 2018-12-14 ENCOUNTER — Other Ambulatory Visit: Payer: Self-pay

## 2018-12-14 ENCOUNTER — Encounter (HOSPITAL_COMMUNITY): Payer: Self-pay | Admitting: *Deleted

## 2018-12-14 ENCOUNTER — Ambulatory Visit (HOSPITAL_COMMUNITY)
Admission: RE | Admit: 2018-12-14 | Discharge: 2018-12-14 | Disposition: A | Discharge status: Home / Self Care | Payer: Medicare Other | Source: Ambulatory Visit | Attending: Internal Medicine | Admitting: Internal Medicine

## 2018-12-14 DIAGNOSIS — I480 Paroxysmal atrial fibrillation: Secondary | ICD-10-CM

## 2018-12-14 DIAGNOSIS — M351 Other overlap syndromes: Secondary | ICD-10-CM

## 2018-12-14 NOTE — Progress Notes (Signed)
Heart Failure TeleHealth Note  Due to national recommendations of social distancing due to [place] 19, Audio/video telehealth visit is felt to be most appropriate for this patient at this time.  See MyChart message from today for patient consent regarding telehealth for Sabrina Douglas.  Date:  12/14/2018   ID:  Sabrina Douglas, Sabrina Douglas December 01, 1940, MRN [id number]  Location: Home  Provider location: [facility] Type of Visit: Established patient  PCP:  Isaac Bliss, Rayford Halsted, MD  Cardiologist:  No primary care provider on file. Primary HF: [place]  Chief Complaint: Heart Failure follow-up   History of Present Illness:  HPI:  Sabrina Douglas is a 78 y.o. female with h/ohypertension, hyperlipidemia, anxiety/depressionreferred by Dr. 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  Patient seen for initial consult with Dr. [name] on 08/28/18. CXR showed mildly worsened bibasilar pneumonia, suggestive of atypical infection d/t waxing/weaning symptoms. 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 Ordered for HRCT, concern for ILD. RX short course prednisone. ANA positive, CCP negative. 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  Admitted 2/26-2/28/20 with severe weakness. Fount to havehyponatremia at114,dysphagia and about 20 pounds weight loss in the months. Treated with 3% saline and eventually demeclocycline. Prior to d/c had VQ and echo as above. Tested for need for home O2 and was told she didn't need it.   Pt admitted 3/10 - 10/17/18 with new onset Afib. Converted back to NSR with amiodarone drip and was discontinued. Toprol resumed for home and Holter monitor ordered. Eliquis started.  She presents via [employer], [employer] (consulting) for a telehealth visit today. Seen by Dr. 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 Now on prednisone and plaquenil. Feels much better. Off oxygen during the day. Able to do  all ADLs without too much problem. Minimal edema. No CP. No bleeding on Eliquis. Occasional palpitations. ZioPatch as below. HR 70-80s mostly.   Zio Patch 11/28/18  1. Predominant rhythm was sinus. Patient had a min HR of 54 bpm, max HR of 214 bpm, and avg HR of 96 bpm. 2. Two brief runs of NSVT - longest was 8 beats 3. Forty five brief runs of SVT. The run with the fastest interval lasting 5 beats with a max rate of 214 bpm, the longest lasting 15.7 secs with an avg rate of 154 bpm. 4. Rare PVCs   RHC 10/15/18 RA = 3 RV = 39/4 PA = 39/7 (18) PCW = 4 Fick cardiac output/index = 6.5/3.8 PVR = 2.1 WU Ao sat = 100% PA sat = 81%, 82% High SVC and axillary sats both 81%  Assessment: 1. Normal pulmonary pressures 2. High output physiology without evidence of intracardiac shunting   Labs  ANA1:1280(pos) ESR 104  RF 55(pos) RNP > 8.0(pos) AntiJo negative CCP < 16 Anti-Smith 2.0 (pos) Scl-70 < 1.0 ANCA negative  HRCT 09/10/18 1. 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 Repeat high-resolution chest CT is recommended in 12 months to assess for temporal changes in the appearance of the lung parenchyma. 2. Aortic atherosclerosis.  Echo 09/30/18 1. The left ventricle has hyperdynamic systolic function, with an ejection fraction of >65%. The cavity size was normal. Left ventricular diastolic parameters were normal No evidence of left ventricular regional wall motion abnormalities. 2. No evidence of left ventricular regional wall motion abnormalities. 3. The right ventricle has normal systolic function.  The cavity was grossly normal. RA-RV peak gradient increased at 66.9 mmHg consistent with probable severe pulmonary hypertension. Could not estimate CVP however. 4. The aortic valve is tricuspid Mild aortic annular calcification noted. 5. The mitral valve is normal in  structure. 6. The tricuspid valve is normal in structure. Tricuspid valve regurgitation is  PFTs (10/02/18) FEV1 1.13 (66%) FVC 1.23 (56%) DLCO 16%  VQ scan 10/03/18: Negative    Sabrina Douglas denies symptoms worrisome for COVID 19.   Past Medical History:  Diagnosis Date   Anxiety    Depression    Hyperlipidemia    Past Surgical History:  Procedure Laterality Date   BALLOON DILATION N/A 10/04/2018   Procedure: BALLOON DILATION;  Surgeon: Otis Brace, MD;  [facility]: [facility];  Service: Gastroenterology;  Laterality: N/A;   BIOPSY  10/04/2018   Procedure: BIOPSY;  Surgeon: Otis Brace, MD;  [facility]: [facility];  Service: Gastroenterology;;   ESOPHAGOGASTRODUODENOSCOPY (EGD) WITH PROPOFOL N/A 10/04/2018   Procedure: ESOPHAGOGASTRODUODENOSCOPY (EGD) WITH PROPOFOL;  Surgeon: Otis Brace, MD;  [facility]: [facility];  Service: Gastroenterology;  Laterality: N/A;   RIGHT HEART CATH N/A 10/15/2018   Procedure: RIGHT HEART CATH;  Surgeon: Jolaine Artist, MD;  Location: [facility];  Service: Cardiovascular;  Laterality: N/A;     Current Outpatient Medications  Medication Sig Dispense Refill   albuterol (PROVENTIL HFA;VENTOLIN HFA) 108 (90 Base) MCG/ACT inhaler Inhale 2 puffs into the lungs every 6 (six) hours as needed for wheezing or shortness of breath. 1 Inhaler 6   ALPRAZolam (XANAX) 0.25 MG tablet Take 0.125-0.25 mg by mouth 2 (two) times daily as needed for anxiety.      apixaban (ELIQUIS) 5 MG TABS tablet Take 1 tablet (5 mg total) by mouth 2 (two) times daily. 60 tablet 3   furosemide (LASIX) 40 MG tablet Take 1 tablet (40 mg total) by mouth daily. 30 tablet 3   Glycerin-Hypromellose-PEG 400 ([facility] DRY EYE RELIEF) 0.2-0.2-1 % SOLN Place 2 drops into both eyes as needed (Dry eyes).     magnesium oxide (MAG-OX) 400 (241.3 Mg) MG tablet Take 1 tablet (400 mg total) by mouth daily. 30 tablet 0   metoprolol tartrate  (LOPRESSOR) 25 MG tablet Take 0.5 tablets (12.5 mg total) by mouth 2 (two) times daily. 180 tablet 1   Multiple Vitamin (MULTIVITAMIN WITH MINERALS) TABS tablet Take 1 tablet by mouth daily.     pantoprazole (PROTONIX) 40 MG tablet Take 40 mg by mouth daily.     potassium chloride SA (K-DUR,KLOR-CON) 20 MEQ tablet Take 1 tablet (20 mEq total) by mouth daily. 30 tablet 0   Respiratory Therapy Supplies (FLUTTER) DEVI 1 Device by Does not apply route as needed. 1 each 0   No current facility-administered medications for this encounter.     Allergies:   Patient has no known allergies.   Social History:  The patient  reports that she has never smoked. She has never used smokeless tobacco. She reports that she does not drink alcohol.   Family History:  The patient's family history includes Diabetes in her father; Prostate cancer in her father; Sudden death (age of onset: 34) in her mother.   ROS:  Please see the history of present illness.   All other systems are personally reviewed and negative.   Exam:  (Video/Tele Health Call; Exam is subjective and or/visual.) General:  Speaks in full sentences. No resp difficulty. Lungs: Normal respiratory effort with conversation.  Abdomen: Non-distended per patient  report Extremities: Pt denies edema. Neuro: Alert & oriented x 3.   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 Much improved on  - Improved NYHA II-III symptoms.  - VQ is negative. - RHC 10/15/18 with normal pulmonary pressures and high output physiology without evidence of intracardiac shunting so not candidate for selective pulmonary vasodilators   2. Chronic hypoxic respiratory failure - likely due to [facility] and ILD  - Improved with treatment of MCTD - Referred to Pulmonary rehab.  - Continue to follow with Pulmonary.  3. Afib, paroxysmal - In NSR on Zio  - Continue Eliquis 5 mg BID - likely can decrease to 2.5 BID when she is > 21 y/o if weight continues to drop  - This patients CHA2DS2-VASc is 4.   4. Palpitations - ZioPatch with brief runs NSVT/SVT - Can increase metoprolol to 25 bid as needed  COVID screen The patient does not have any symptoms that suggest any further testing/ screening at this time.  Social distancing reinforced today.  Recommended follow-up:  3-4 months  Relevant cardiac medications were reviewed at length with the patient today.   The patient does not have concerns regarding their medications at this time.   The following changes were made today:  As above  Today, I have spent 22 minutes with the patient with telehealth technology discussing the above issues .    Signed, Glori Bickers, MD  12/14/2018 11:26 AM  Advanced Heart Failure [address] [place] [address] [phone] (office) [phone] (fax)

## 2018-12-14 NOTE — Patient Instructions (Signed)
Your physician recommends that you schedule a follow-up appointment in: 3-4 months, our office will call you to schedule this appointment.  If you have any questions or concerns before your next appointment please send [place] a message through [place] or call our office at [phone].

## 2018-12-14 NOTE — Addendum Note (Signed)
Encounter addended by: Noralee Space, RN on: 12/14/2018 12:40 PM  Actions taken: Clinical Note Signed

## 2018-12-14 NOTE — Progress Notes (Signed)
AVS sent to pt via mychart. 

## 2018-12-18 ENCOUNTER — Ambulatory Visit (INDEPENDENT_AMBULATORY_CARE_PROVIDER_SITE_OTHER): Payer: Medicare Other | Admitting: Internal Medicine

## 2018-12-18 ENCOUNTER — Telehealth: Payer: Self-pay | Admitting: *Deleted

## 2018-12-18 ENCOUNTER — Other Ambulatory Visit: Payer: Self-pay

## 2018-12-18 DIAGNOSIS — E440 Moderate protein-calorie malnutrition: Secondary | ICD-10-CM | POA: Diagnosis not present

## 2018-12-18 DIAGNOSIS — J849 Interstitial pulmonary disease, unspecified: Secondary | ICD-10-CM | POA: Diagnosis not present

## 2018-12-18 DIAGNOSIS — I10 Essential (primary) hypertension: Secondary | ICD-10-CM | POA: Diagnosis not present

## 2018-12-18 NOTE — Telephone Encounter (Signed)
Spoke with Sharyl Nimrod and they will get the test done at the next home visit.

## 2018-12-18 NOTE — Telephone Encounter (Signed)
Spoke with [place] with [facility].   coronavirus antibody testing has been placed for the patient.  What information does the [facility] nurse need?  Arline Asp is the PRN nurse.  She will have someone from the office call back. CRM

## 2018-12-18 NOTE — Progress Notes (Addendum)
Virtual Visit via Video Note  I connected with Sabrina Douglas on 12/18/18 at 10:00 AM EDT by a video enabled telemedicine application and verified that I am speaking with the correct person using two identifiers.  Location patient: home Location provider: work office Persons participating in the virtual visit: patient, provider, daughter  I discussed the limitations of evaluation and management by telemedicine and the availability of in person appointments. The patient expressed understanding and agreed to proceed.   HPI: This is a scheduled follow up visit. She has been doing well since we last spoke. She has gained 11 pounds in 5 weeks! Her appetite has improved and she is eating chocolate ice cream every night. They want to discuss her recent lab work that was drawn by [facility] agency. She also would like COVID-19 antibody testing as she feels she had the disease in early March.  Labs look ok including electrolytes. LDL is 113. She has no acute complaints.  ROS: Constitutional: Denies fever, chills, diaphoresis, appetite change and fatigue.  HEENT: Denies photophobia, eye pain, redness, hearing loss, ear pain, congestion, sore throat, rhinorrhea, sneezing, mouth sores, trouble swallowing, neck pain, neck stiffness and tinnitus.   Respiratory: Denies SOB, DOE, cough, chest tightness,  and wheezing.   Cardiovascular: Denies chest pain, palpitations and leg swelling.  Gastrointestinal: Denies nausea, vomiting, abdominal pain, diarrhea, constipation, blood in stool and abdominal distention.  Genitourinary: Denies dysuria, urgency, frequency, hematuria, flank pain and difficulty urinating.  Endocrine: Denies: hot or cold intolerance, sweats, changes in hair or nails, polyuria, polydipsia. Musculoskeletal: Denies myalgias, back pain, joint swelling, arthralgias and gait problem.  Skin: Denies pallor, rash and wound.  Neurological: Denies dizziness, seizures, syncope, weakness,  light-headedness, numbness and headaches.  Hematological: Denies adenopathy. Easy bruising, personal or family bleeding history  Psychiatric/Behavioral: Denies suicidal ideation, mood changes, confusion, nervousness, sleep disturbance and agitation   Past Medical History:  Diagnosis Date  . Anxiety   . Depression   . Hyperlipidemia     Past Surgical History:  Procedure Laterality Date  . BALLOON DILATION N/A 10/04/2018   Procedure: BALLOON DILATION;  Surgeon: Kathi Der, MD;  Location: [facility];  Service: Gastroenterology;  Laterality: N/A;  . BIOPSY  10/04/2018   Procedure: BIOPSY;  Surgeon: Kathi Der, MD;  Location: [facility];  Service: Gastroenterology;;  . ESOPHAGOGASTRODUODENOSCOPY (EGD) WITH PROPOFOL N/A 10/04/2018   Procedure: ESOPHAGOGASTRODUODENOSCOPY (EGD) WITH PROPOFOL;  Surgeon: Kathi Der, MD;  Location: [facility];  Service: Gastroenterology;  Laterality: N/A;  . RIGHT HEART CATH N/A 10/15/2018   Procedure: RIGHT HEART CATH;  Surgeon: Dolores Patty, MD;  Location: [facility];  Service: Cardiovascular;  Laterality: N/A;    Family History  Problem Relation Age of Onset  . Sudden death Mother 7       sudden death post hysterectomy  . Prostate cancer Father   . Diabetes Father     SOCIAL HX:   reports that she has never smoked. She has never used smokeless tobacco. She reports that she does not drink alcohol. No history on file for drug.   Current Outpatient Medications:  .  albuterol (PROVENTIL HFA;VENTOLIN HFA) 108 (90 Base) MCG/ACT inhaler, Inhale 2 puffs into the lungs every 6 (six) hours as needed for wheezing or shortness of breath., Disp: 1 Inhaler, Rfl: 6 .  ALPRAZolam (XANAX) 0.25 MG tablet, Take 0.125-0.25 mg by mouth 2 (two) times daily as needed for anxiety. , Disp: , Rfl:  .  apixaban (ELIQUIS)  5 MG TABS tablet, Take 1 tablet (5 mg total) by mouth 2 (two) times daily., Disp: 60 tablet, Rfl: 3 .  furosemide (LASIX)  40 MG tablet, Take 1 tablet (40 mg total) by mouth daily., Disp: 30 tablet, Rfl: 3 .  Glycerin-Hypromellose-PEG 400 ([facility] DRY EYE RELIEF) 0.2-0.2-1 % SOLN, Place 2 drops into both eyes as needed (Dry eyes)., Disp: , Rfl:  .  magnesium oxide (MAG-OX) 400 (241.3 Mg) MG tablet, Take 1 tablet (400 mg total) by mouth daily., Disp: 30 tablet, Rfl: 0 .  metoprolol tartrate (LOPRESSOR) 25 MG tablet, Take 0.5 tablets (12.5 mg total) by mouth 2 (two) times daily., Disp: 180 tablet, Rfl: 1 .  Multiple Vitamin (MULTIVITAMIN WITH MINERALS) TABS tablet, Take 1 tablet by mouth daily., Disp: , Rfl:  .  pantoprazole (PROTONIX) 40 MG tablet, Take 40 mg by mouth daily., Disp: , Rfl:  .  potassium chloride SA (K-DUR,KLOR-CON) 20 MEQ tablet, Take 1 tablet (20 mEq total) by mouth daily., Disp: 30 tablet, Rfl: 0 .  Respiratory Therapy Supplies (FLUTTER) DEVI, 1 Device by Does not apply route as needed., Disp: 1 each, Rfl: 0  EXAM:   VITALS per patient if applicable: none reported  GENERAL: alert, oriented, appears well and in no acute distress  HEENT: atraumatic, conjunttiva clear, no obvious abnormalities on inspection of external nose and ears, wears corrective lenses  NECK: normal movements of the head and neck  LUNGS: on inspection no signs of respiratory distress, breathing rate appears normal, no obvious gross increased work of breathing, gasping or wheezing  CV: no obvious cyanosis  MS: moves all visible extremities without noticeable abnormality  PSYCH/NEURO: pleasant and cooperative, no obvious depression or anxiety, speech and thought processing grossly intact  ASSESSMENT AND PLAN:   ILD (interstitial lung disease) (HCC)  -Appears to be at baseline, she only sues oxygen PRN with exercise.  Essential hypertension -Had been well controlled in past. -They would like to check K in 6 weeks or so as she is on lasix without a K supplement (K on 4/24 was 5.2).  Malnutrition of moderate degree -Has  gained 11 pounds in 5 weeks, appetite is improved.   Patient and daughter insist the would like COVID-19 antibody testing. Have advised that this would purely be for informational purposes only. Will order.   I discussed the assessment and treatment plan with the patient. The patient was provided an opportunity to ask questions and all were answered. The patient agreed with the plan and demonstrated an understanding of the instructions.   The patient was advised to call back or seek an in-person evaluation if the symptoms worsen or if the condition fails to improve as anticipated.    Chaya JanEstela Hernandez Acosta, MD  [facility]

## 2018-12-18 NOTE — Telephone Encounter (Signed)
Copied from CRM [id number]. Topic: General - Other >> Dec 18, 2018 11:51 AM Herby Abraham C wrote: Reason for CRM: Sharyl Nimrod with [facility] called in returning [place] call in regards to pt's covid testing.    CB: [phone]

## 2018-12-24 ENCOUNTER — Telehealth (HOSPITAL_COMMUNITY): Payer: Self-pay | Admitting: *Deleted

## 2018-12-24 NOTE — Telephone Encounter (Signed)
Attempted to contact patient regarding Pulmonary Rehab referral. No answer/no voicemail, please contact letter sent.  Artist Pais, MS, ACSM CEP [phone]

## 2018-12-28 ENCOUNTER — Telehealth: Payer: Self-pay | Admitting: Internal Medicine

## 2018-12-28 ENCOUNTER — Encounter: Payer: Self-pay | Admitting: Internal Medicine

## 2018-12-28 NOTE — Telephone Encounter (Signed)
Copied from CRM [id number]. Topic: General - Other >> Dec 28, 2018 10:53 AM Doreatha Massed wrote: Reason for CRM: pt want to know the results of her covid lab test please call.

## 2018-12-28 NOTE — Telephone Encounter (Signed)
Clinic RN contacted patient. Patient reports she did not call asking for the results, but her daughter is her HCPOA. Clinic RN informed her when we get results we will call her. Patient verbalized understanding.

## 2019-01-01 ENCOUNTER — Encounter: Payer: Self-pay | Admitting: Internal Medicine

## 2019-01-10 ENCOUNTER — Other Ambulatory Visit (HOSPITAL_COMMUNITY): Payer: Self-pay | Admitting: Internal Medicine

## 2019-01-15 ENCOUNTER — Ambulatory Visit: Payer: Medicare Other | Admitting: Pulmonary Disease

## 2019-01-16 ENCOUNTER — Other Ambulatory Visit: Payer: Self-pay | Admitting: Internal Medicine

## 2019-01-26 ENCOUNTER — Encounter: Payer: Self-pay | Admitting: Internal Medicine

## 2019-02-05 ENCOUNTER — Other Ambulatory Visit: Payer: Self-pay

## 2019-02-05 ENCOUNTER — Ambulatory Visit (INDEPENDENT_AMBULATORY_CARE_PROVIDER_SITE_OTHER): Payer: Medicare Other | Admitting: Internal Medicine

## 2019-02-05 ENCOUNTER — Encounter: Payer: Self-pay | Admitting: Internal Medicine

## 2019-02-05 VITALS — BP 110/80 | HR 76 | Temp 98.2°F | Wt 151.2 lb

## 2019-02-05 DIAGNOSIS — J9611 Chronic respiratory failure with hypoxia: Secondary | ICD-10-CM

## 2019-02-05 DIAGNOSIS — R300 Dysuria: Secondary | ICD-10-CM

## 2019-02-05 DIAGNOSIS — I272 Pulmonary hypertension, unspecified: Secondary | ICD-10-CM

## 2019-02-05 DIAGNOSIS — J849 Interstitial pulmonary disease, unspecified: Secondary | ICD-10-CM

## 2019-02-05 DIAGNOSIS — I4891 Unspecified atrial fibrillation: Secondary | ICD-10-CM

## 2019-02-05 DIAGNOSIS — E440 Moderate protein-calorie malnutrition: Secondary | ICD-10-CM

## 2019-02-05 DIAGNOSIS — Z780 Asymptomatic menopausal state: Secondary | ICD-10-CM

## 2019-02-05 DIAGNOSIS — E871 Hypo-osmolality and hyponatremia: Secondary | ICD-10-CM

## 2019-02-05 DIAGNOSIS — R600 Localized edema: Secondary | ICD-10-CM

## 2019-02-05 DIAGNOSIS — I10 Essential (primary) hypertension: Secondary | ICD-10-CM | POA: Diagnosis not present

## 2019-02-05 LAB — CBC WITH DIFFERENTIAL/PLATELET
Basophils Absolute: 0.1 10*3/uL (ref 0.0–0.1)
Basophils Relative: 1 % (ref 0.0–3.0)
Eosinophils Absolute: 0.1 10*3/uL (ref 0.0–0.7)
Eosinophils Relative: 0.8 % (ref 0.0–5.0)
HCT: 38.8 % (ref 36.0–46.0)
Hemoglobin: 12.8 g/dL (ref 12.0–15.0)
Lymphocytes Relative: 12.7 % (ref 12.0–46.0)
Lymphs Abs: 0.8 10*3/uL (ref 0.7–4.0)
MCHC: 32.9 g/dL (ref 30.0–36.0)
MCV: 97.3 fl (ref 78.0–100.0)
Monocytes Absolute: 0.3 10*3/uL (ref 0.1–1.0)
Monocytes Relative: 5.5 % (ref 3.0–12.0)
Neutro Abs: 5 10*3/uL (ref 1.4–7.7)
Neutrophils Relative %: 80 % — ABNORMAL HIGH (ref 43.0–77.0)
Platelets: 347 10*3/uL (ref 150.0–400.0)
RBC: 3.99 Mil/uL (ref 3.87–5.11)
RDW: 12.1 % (ref 11.5–15.5)
WBC: 6.3 10*3/uL (ref 4.0–10.5)

## 2019-02-05 LAB — COMPREHENSIVE METABOLIC PANEL
ALT: 10 U/L (ref 0–35)
AST: 24 U/L (ref 0–37)
Albumin: 4 g/dL (ref 3.5–5.2)
Alkaline Phosphatase: 83 U/L (ref 39–117)
BUN: 10 mg/dL (ref 6–23)
CO2: 33 mEq/L — ABNORMAL HIGH (ref 19–32)
Calcium: 9.8 mg/dL (ref 8.4–10.5)
Chloride: 102 mEq/L (ref 96–112)
Creatinine, Ser: 0.78 mg/dL (ref 0.40–1.20)
GFR: 86.07 mL/min (ref >60.00)
Glucose, Bld: 61 mg/dL — ABNORMAL LOW (ref 70–99)
Potassium: 4.7 mEq/L (ref 3.5–5.1)
Sodium: 141 mEq/L (ref 135–145)
Total Bilirubin: 0.3 mg/dL (ref 0.2–1.2)
Total Protein: 7.3 g/dL (ref 6.0–8.3)

## 2019-02-05 MED ORDER — METOPROLOL TARTRATE 25 MG PO TABS: 12.5000 mg | ORAL_TABLET | Freq: Two times a day (BID) | ORAL | 1 refills | Status: DC

## 2019-02-05 NOTE — Progress Notes (Signed)
Established Patient Office Visit     CC/Reason for Visit: Follow up chronic conditions  HPI: Sabrina Douglas is a 78 y.o. female who is coming in today for the above mentioned reasons. Past Medical History is significant for: hypertension, hyperlipidemia, ILD, anxiety, depression, pulmonary htn and hyponatremia as well as a fib. Recently she had been having some significant weight loss, but has had some good weight gain since. With her ILD she is still using oxygen PRN, has noticed mild increase in SOB after her prednisone was reduced from 10 to 5 mg by Dr. [name], but nothing extremely significant. She still has LE edema, has not been wearing her compression stockings. Needs refills of BP meds today. Still with chronic right shoulder pain: cannot abduct beyond 90 degrees.   Past Medical/Surgical History: Past Medical History:  Diagnosis Date  . Anxiety   . Depression   . Hyperlipidemia     Past Surgical History:  Procedure Laterality Date  . BALLOON DILATION N/A 10/04/2018   Procedure: BALLOON DILATION;  Surgeon: Otis Brace, MD;  Location: [facility];  Service: Gastroenterology;  Laterality: N/A;  . BIOPSY  10/04/2018   Procedure: BIOPSY;  Surgeon: Otis Brace, MD;  Location: [facility];  Service: Gastroenterology;;  . ESOPHAGOGASTRODUODENOSCOPY (EGD) WITH PROPOFOL N/A 10/04/2018   Procedure: ESOPHAGOGASTRODUODENOSCOPY (EGD) WITH PROPOFOL;  Surgeon: Otis Brace, MD;  Location: [facility];  Service: Gastroenterology;  Laterality: N/A;  . RIGHT HEART CATH N/A 10/15/2018   Procedure: RIGHT HEART CATH;  Surgeon: Jolaine Artist, MD;  Location: [facility];  Service: Cardiovascular;  Laterality: N/A;    Social History:  reports that she has never smoked. She has never used smokeless tobacco. She reports that she does not drink alcohol. No history on file for drug.  Allergies: No Known Allergies  Family History:  Family History  Problem  Relation Age of Onset  . Sudden death Mother 88       sudden death post hysterectomy  . Prostate cancer Father   . Diabetes Father      Current Outpatient Medications:  .  albuterol (PROVENTIL HFA;VENTOLIN HFA) 108 (90 Base) MCG/ACT inhaler, Inhale 2 puffs into the lungs every 6 (six) hours as needed for wheezing or shortness of breath., Disp: 1 Inhaler, Rfl: 6 .  ALPRAZolam (XANAX) 0.25 MG tablet, Take 0.125-0.25 mg by mouth 2 (two) times daily as needed for anxiety. , Disp: , Rfl:  .  apixaban (ELIQUIS) 5 MG TABS tablet, Take 1 tablet (5 mg total) by mouth 2 (two) times daily., Disp: 60 tablet, Rfl: 3 .  furosemide (LASIX) 40 MG tablet, TAKE 1 TABLET BY MOUTH DAILY, Disp: 90 tablet, Rfl: 1 .  Glycerin-Hypromellose-PEG 400 ([facility] DRY EYE RELIEF) 0.2-0.2-1 % SOLN, Place 2 drops into both eyes as needed (Dry eyes)., Disp: , Rfl:  .  hydroxychloroquine (PLAQUENIL) 200 MG tablet, Take 1 tablet by mouth 2 (two) times a day., Disp: , Rfl:  .  magnesium oxide (MAG-OX) 400 (241.3 Mg) MG tablet, Take 1 tablet (400 mg total) by mouth daily., Disp: 30 tablet, Rfl: 0 .  metoprolol tartrate (LOPRESSOR) 25 MG tablet, Take 0.5 tablets (12.5 mg total) by mouth 2 (two) times daily., Disp: 180 tablet, Rfl: 1 .  Multiple Vitamin (MULTIVITAMIN WITH MINERALS) TABS tablet, Take 1 tablet by mouth daily., Disp: , Rfl:  .  pantoprazole (PROTONIX) 40 MG tablet, Take 40 mg by mouth daily., Disp: , Rfl:  .  potassium chloride SA (  K-DUR,KLOR-CON) 20 MEQ tablet, Take 1 tablet (20 mEq total) by mouth daily., Disp: 30 tablet, Rfl: 0 .  predniSONE (DELTASONE) 5 MG tablet, Take 5 mg by mouth daily with breakfast., Disp: , Rfl:  .  predniSONE (STERAPRED UNI-PAK 21 TAB) 5 MG (21) TBPK tablet, Take 1 tablet by mouth 2 (two) times a day., Disp: , Rfl:  .  Respiratory Therapy Supplies (FLUTTER) DEVI, 1 Device by Does not apply route as needed., Disp: 1 each, Rfl: 0  Review of Systems:  Constitutional: Denies fever, chills,  diaphoresis, appetite change and fatigue.  HEENT: Denies photophobia, eye pain, redness, hearing loss, ear pain, congestion, sore throat, rhinorrhea, sneezing, mouth sores, trouble swallowing, neck pain, neck stiffness and tinnitus.   Respiratory: Denies  cough, chest tightness,  and wheezing.   Cardiovascular: Denies chest pain, palpitations and leg swelling.  Gastrointestinal: Denies nausea, vomiting, abdominal pain, diarrhea, constipation, blood in stool and abdominal distention.  Genitourinary: Denies dysuria, urgency, frequency, hematuria, flank pain and difficulty urinating.  Endocrine: Denies: hot or cold intolerance, sweats, changes in hair or nails, polyuria, polydipsia. Musculoskeletal: Denies myalgias, back pain, joint swelling, arthralgias and gait problem.  Skin: Denies pallor, rash and wound.  Neurological: Denies dizziness, seizures, syncope, weakness, light-headedness, numbness and headaches.  Hematological: Denies adenopathy. Easy bruising, personal or family bleeding history  Psychiatric/Behavioral: Denies suicidal ideation, mood changes, confusion, nervousness, sleep disturbance and agitation    Physical Exam: Vitals:   02/05/19 1028  BP: 110/80  Pulse: 76  Temp: 98.2 F (36.8 C)  TempSrc: Oral  SpO2: 99%  Weight: 151 lb 3.2 oz (68.6 kg)    Body mass index is 25.55 kg/m.   Constitutional: NAD, calm, comfortable Eyes: PERRL, lids and conjunctivae normal, wears corrective lenses. ENMT: Mucous membranes are moist. Respiratory: clear to auscultation bilaterally, no wheezing, no crackles. Normal respiratory effort. No accessory muscle use.  Cardiovascular: Regular rate, irregular rhythm, no murmurs / rubs / gallops. 2+ LE edema bilaterally. 2+ pedal pulses. No carotid bruits.  Abdomen: no tenderness, no masses palpated. No hepatosplenomegaly. Bowel sounds positive.  Musculoskeletal: no clubbing / cyanosis. No joint deformity upper and lower extremities.  Normal  muscle tone.  Skin: no rashes, lesions, ulcers. No induration Neurologic: CN 2-12 grossly intact. Sensation intact, DTR normal. Strength 5/5 in all 4.  Psychiatric: Normal judgment and insight. Alert and oriented x 3. Normal mood.    Impression and Plan:  ILD (interstitial lung disease) (HCC) Pulmonary hypertension, unspecified (HCC) -Continue follow up as scheduled with pulmonary and rheumatology. -Is currently on prednisone and hydroxychloroquine (have advised she needs routine eye exams, last was cancelled due to COVID-19).  Essential hypertension -Refill metoprolol. -Well controlled.  Lower extremity edema -Suspect combination of low albumin, prednisone use and her being sedentary. -Advised routine use of compression stockings.  Malnutrition of moderate degree -Has had increased weights. -Check labs today.  New onset a-fib (HCC) -Rate controlled. -Anticoagulated on Eliquis.    Patient Instructions  -Nice seeing you today!!  -Lab work today; will notify you once results are available.  -Schedule follow up in 4 months.     Chaya JanEstela Hernandez Acosta, MD [facility]

## 2019-02-05 NOTE — Patient Instructions (Signed)
-  Nice seeing you today!!  -Lab work today; will notify you once results are available.  -Schedule follow up in 4 months. 

## 2019-02-06 ENCOUNTER — Encounter: Payer: Self-pay | Admitting: Internal Medicine

## 2019-02-07 LAB — MAGNESIUM: Magnesium: 2.1 mg/dL (ref 1.5–2.5)

## 2019-02-13 ENCOUNTER — Encounter: Payer: Self-pay | Admitting: Internal Medicine

## 2019-02-13 NOTE — Telephone Encounter (Unsigned)
Copied from [place] [id number]. Topic: General - Other >> Feb 13, 2019  9:49 AM Yvette Rack wrote: Reason for CRM: Pt daughter Nevin Bloodgood called for an update on the fax from [facility] regarding a portable oxygen concentrator. Paula requests call back. Cb# [phone]

## 2019-02-14 LAB — HM DEXA SCAN

## 2019-02-21 ENCOUNTER — Encounter: Payer: Self-pay | Admitting: Internal Medicine

## 2019-02-27 ENCOUNTER — Encounter: Payer: Self-pay | Admitting: Internal Medicine

## 2019-03-05 ENCOUNTER — Encounter: Payer: Self-pay | Admitting: Internal Medicine

## 2019-03-05 DIAGNOSIS — M8580 Other specified disorders of bone density and structure, unspecified site: Secondary | ICD-10-CM | POA: Insufficient documentation

## 2019-03-08 ENCOUNTER — Other Ambulatory Visit (HOSPITAL_COMMUNITY): Payer: Self-pay | Admitting: Internal Medicine

## 2019-03-13 ENCOUNTER — Encounter: Payer: Self-pay | Admitting: Pulmonary Disease

## 2019-03-13 ENCOUNTER — Other Ambulatory Visit: Payer: Self-pay

## 2019-03-13 ENCOUNTER — Ambulatory Visit (INDEPENDENT_AMBULATORY_CARE_PROVIDER_SITE_OTHER): Payer: Medicare Other | Admitting: Pulmonary Disease

## 2019-03-13 DIAGNOSIS — J9611 Chronic respiratory failure with hypoxia: Secondary | ICD-10-CM | POA: Diagnosis not present

## 2019-03-13 DIAGNOSIS — J849 Interstitial pulmonary disease, unspecified: Secondary | ICD-10-CM

## 2019-03-13 NOTE — Progress Notes (Signed)
   Subjective:    Patient ID: Sabrina Douglas, female    DOB: [birth year], 78 y.o.   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  Echo showed pulmonary hypertension and she was admitted 10/15/18 and underwent right heart cath resulted below.  Prior to this she was hospitalized for hyponatremia She was hospitalized 3/12 for dyspnea, CT angiogram was negative for pulmonary bothersome.  She had new onset atrial fibrillation with RVR which was rate controlled with amiodarone and metoprolol, started on Eliquis  Chief Complaint  Patient presents with  . Follow-up    Patient states that hse still has some sob, but its some better than it was at the last visit. She reports that she has had to use her oxygen more in the past week during the day. She reports not having to use her rescue inhaler.    Last visit 10/2018, she has seen Dr. [name] since and was started on Plaquenil and 10 mg of prednisone, this was decreased to 5 mg in April.  She feels significantly improved.  Breathing is better cough has decreased, she is able to stay off oxygen in the daytime and uses it during sleep. 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  Benign stricture   Review of Systems neg for any significant sore throat, dysphagia,  itching, sneezing, nasal congestion or excess/ purulent secretions, fever, chills, sweats, unintended wt loss, pleuritic or exertional cp, hempoptysis, orthopnea pnd or change in chronic leg swelling. Also denies presyncope, palpitations, heartburn, abdominal pain, nausea, vomiting, diarrhea or change in bowel or urinary habits, dysuria,hematuria, rash, arthralgias, visual complaints, headache, numbness weakness or ataxia.     Objective:   Physical Exam   Gen. Pleasant, well-nourished, in no distress ENT - no thrush, no pallor/icterus,no post nasal drip Neck: No JVD, no thyromegaly, no carotid bruits Lungs: no use of accessory muscles, no dullness to percussion, bibasal  rales no rhonchi  Cardiovascular: Rhythm regular, heart sounds  normal, no murmurs or gallops, no peripheral edema Musculoskeletal: No deformities, no cyanosis or clubbing         Assessment & Plan:

## 2019-03-13 NOTE — Patient Instructions (Signed)
HRCT chest in first week of October. Stay on 5 mg of prednisone until then  Based on this, we will decide need for PFTs. Ambulatory saturation checked today Stay on oxygen during sleep

## 2019-03-13 NOTE — Assessment & Plan Note (Signed)
Appears improved on Plaquenil and low-dose prednisone  HRCT chest in first week of October. Stay on 5 mg of prednisone until then  Based on this, we will decide need for PFTs.

## 2019-03-13 NOTE — Assessment & Plan Note (Signed)
Ambulatory saturation checked today Stay on oxygen during sleep

## 2019-03-18 ENCOUNTER — Ambulatory Visit (HOSPITAL_COMMUNITY)
Admission: RE | Admit: 2019-03-18 | Discharge: 2019-03-18 | Disposition: A | Discharge status: Home / Self Care | Payer: Medicare Other | Source: Ambulatory Visit | Attending: Cardiology | Admitting: Cardiology

## 2019-03-18 ENCOUNTER — Other Ambulatory Visit: Payer: Self-pay

## 2019-03-18 ENCOUNTER — Encounter (HOSPITAL_COMMUNITY): Payer: Self-pay

## 2019-03-18 VITALS — BP 128/96 | HR 84 | Wt 151.6 lb

## 2019-03-18 DIAGNOSIS — J9611 Chronic respiratory failure with hypoxia: Secondary | ICD-10-CM | POA: Insufficient documentation

## 2019-03-18 DIAGNOSIS — M351 Other overlap syndromes: Secondary | ICD-10-CM | POA: Diagnosis not present

## 2019-03-18 DIAGNOSIS — I509 Heart failure, unspecified: Secondary | ICD-10-CM | POA: Diagnosis not present

## 2019-03-18 DIAGNOSIS — E785 Hyperlipidemia, unspecified: Secondary | ICD-10-CM | POA: Diagnosis not present

## 2019-03-18 DIAGNOSIS — J8410 Pulmonary fibrosis, unspecified: Secondary | ICD-10-CM | POA: Diagnosis not present

## 2019-03-18 DIAGNOSIS — F329 Major depressive disorder, single episode, unspecified: Secondary | ICD-10-CM | POA: Insufficient documentation

## 2019-03-18 DIAGNOSIS — I272 Pulmonary hypertension, unspecified: Secondary | ICD-10-CM | POA: Diagnosis not present

## 2019-03-18 DIAGNOSIS — R002 Palpitations: Secondary | ICD-10-CM | POA: Insufficient documentation

## 2019-03-18 DIAGNOSIS — Z79899 Other long term (current) drug therapy: Secondary | ICD-10-CM | POA: Insufficient documentation

## 2019-03-18 DIAGNOSIS — I480 Paroxysmal atrial fibrillation: Secondary | ICD-10-CM | POA: Insufficient documentation

## 2019-03-18 DIAGNOSIS — I110 Hypertensive heart disease with heart failure: Secondary | ICD-10-CM | POA: Diagnosis present

## 2019-03-18 DIAGNOSIS — Z7901 Long term (current) use of anticoagulants: Secondary | ICD-10-CM | POA: Diagnosis not present

## 2019-03-18 DIAGNOSIS — F419 Anxiety disorder, unspecified: Secondary | ICD-10-CM | POA: Insufficient documentation

## 2019-03-18 NOTE — Patient Instructions (Signed)
Please follow up with the [facility] in 3 months.  At the [facility], you and your health needs are our priority. As part of our continuing mission to provide you with exceptional heart care, we have created designated Provider Care Teams. These Care Teams include your primary Cardiologist (physician) and Advanced Practice Providers (APPs- Physician Assistants and Nurse Practitioners) who all work together to provide you with the care you need, when you need it.   You may see any of the following providers on your designated Care Team at your next follow up: . Dr Daniel Bensimhon . Dr Dalton McLean . Amy Clegg, NP   Please be sure to bring in all your medications bottles to every appointment.    

## 2019-03-18 NOTE — Progress Notes (Signed)
Date:  03/18/2019   ID:  Sabrina Douglas, Sabrina Douglas January 13, 1941, MRN [id number]  Location: Home  Provider location: [facility] Type of Visit: Established patient  PCP:  Isaac Bliss, Rayford Halsted, MD  Cardiologist:  No primary care provider on file. Primary HF: [place]  Chief Complaint: Heart Failure follow-up   History of Present Illness:  HPI: Sabrina Douglas is a 78 y.o. female with h/ohypertension, hyperlipidemia, anxiety/depression, and pulmonary hypertension.   Patient seen for initial consult with Dr. [name] on 08/28/18. CXR showed mildly worsened bibasilar pneumonia, suggestive of atypical infection d/t waxing/weaning symptoms. 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 Ordered for HRCT, concern for ILD. RX short course prednisone. ANA positive, CCP negative. 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  Admitted 2/26-2/28/20 with severe weakness. Fount to havehyponatremia at114,dysphagia and about 20 pounds weight loss in the months. Treated with 3% saline and eventually demeclocycline. Prior to d/c had VQ and echo as above. Tested for need for home O2 and was told she didn't need it.   Pt admitted 3/10 - 10/17/18 with new onset Afib. Converted back to NSR with amiodarone drip and was discontinued. Toprol resumed for home and Holter monitor ordered. Eliquis started.  She returns for HF follow up. Overall feeling fine. Mild dyspnea with exertion. She continues to use oxygen at night and occasionally during the day. Denies PND/Orthopnea.No bleeding issues.  Appetite ok. No fever or chills. Weight at home 148-151 pounds. Taking all medications.  Zio Patch 11/28/18  1. Predominant rhythm was sinus. Patient had a min HR of 54 bpm, max HR of 214 bpm, and avg HR of 96 bpm. 2. Two brief runs of NSVT - longest was 8 beats 3. Forty five brief runs of SVT. The run with the fastest interval lasting 5  beats with a max rate of 214 bpm, the longest lasting 15.7 secs with an avg rate of 154 bpm. 4. Rare PVCs   RHC 10/15/18 RA = 3 RV = 39/4 PA = 39/7 (18) PCW = 4 Fick cardiac output/index = 6.5/3.8 PVR = 2.1 WU Ao sat = 100% PA sat = 81%, 82% High SVC and axillary sats both 81%  Assessment: 1. Normal pulmonary pressures 2. High output physiology without evidence of intracardiac shunting   Labs  ANA1:1280(pos) ESR 104  RF 55(pos) RNP > 8.0(pos) AntiJo negative CCP < 16 Anti-Smith 2.0 (pos) Scl-70 < 1.0 ANCA negative  HRCT 09/10/18 1. 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 Repeat high-resolution chest CT is recommended in 12 months to assess for temporal changes in the appearance of the lung parenchyma. 2. Aortic atherosclerosis.  Echo 09/30/18 1. The left ventricle has hyperdynamic systolic function, with an ejection fraction of >65%. The cavity size was normal. Left ventricular diastolic parameters were normal No evidence of left ventricular regional wall motion abnormalities. 2. No evidence of left ventricular regional wall motion abnormalities. 3. The right ventricle has normal systolic function. The cavity was grossly normal. RA-RV peak gradient increased at 66.9 mmHg consistent with probable severe pulmonary hypertension. Could not estimate CVP however. 4. The aortic valve is tricuspid Mild aortic annular calcification noted. 5. The mitral valve is normal in structure. 6. The tricuspid valve is normal in structure. Tricuspid valve regurgitation is  PFTs (10/02/18) FEV1 1.13 (66%) FVC 1.23 (56%) DLCO 16%  VQ  scan 10/03/18: Negative    Sabrina Douglas denies symptoms worrisome for COVID 19.   Past Medical History:  Diagnosis Date  . Anxiety   . Depression   . Hyperlipidemia    Past Surgical History:  Procedure Laterality  Date  . BALLOON DILATION N/A 10/04/2018   Procedure: BALLOON DILATION;  Surgeon: Otis Brace, MD;  Location: [facility];  Service: Gastroenterology;  Laterality: N/A;  . BIOPSY  10/04/2018   Procedure: BIOPSY;  Surgeon: Otis Brace, MD;  Location: [facility];  Service: Gastroenterology;;  . ESOPHAGOGASTRODUODENOSCOPY (EGD) WITH PROPOFOL N/A 10/04/2018   Procedure: ESOPHAGOGASTRODUODENOSCOPY (EGD) WITH PROPOFOL;  Surgeon: Otis Brace, MD;  Location: [facility];  Service: Gastroenterology;  Laterality: N/A;  . RIGHT HEART CATH N/A 10/15/2018   Procedure: RIGHT HEART CATH;  Surgeon: Jolaine Artist, MD;  Location: [facility];  Service: Cardiovascular;  Laterality: N/A;     Current Outpatient Medications  Medication Sig Dispense Refill  . albuterol (PROVENTIL HFA;VENTOLIN HFA) 108 (90 Base) MCG/ACT inhaler Inhale 2 puffs into the lungs every 6 (six) hours as needed for wheezing or shortness of breath. 1 Inhaler 6  . ALPRAZolam (XANAX) 0.25 MG tablet Take 0.125-0.25 mg by mouth 2 (two) times daily as needed for anxiety.     Marland Kitchen apixaban (ELIQUIS) 5 MG TABS tablet Take 1 tablet (5 mg total) by mouth 2 (two) times daily. 60 tablet 3  . furosemide (LASIX) 40 MG tablet TAKE 1 TABLET BY MOUTH DAILY 90 tablet 1  . Glycerin-Hypromellose-PEG 400 ([employer] DRY EYE RELIEF) 0.2-0.2-1 % SOLN Place 2 drops into both eyes as needed (Dry eyes).    . hydroxychloroquine (PLAQUENIL) 200 MG tablet Take 1 tablet by mouth 2 (two) times a day.    . metoprolol tartrate (LOPRESSOR) 25 MG tablet Take 0.5 tablets (12.5 mg total) by mouth 2 (two) times daily. 180 tablet 1  . Multiple Vitamin (MULTIVITAMIN WITH MINERALS) TABS tablet Take 1 tablet by mouth daily.    . pantoprazole (PROTONIX) 40 MG tablet Take 40 mg by mouth daily.    . potassium chloride SA (K-DUR,KLOR-CON) 20 MEQ tablet Take 1 tablet (20 mEq total) by mouth daily. 30 tablet 0  . predniSONE (DELTASONE) 5 MG tablet Take 5 mg by mouth  daily with breakfast.    . Respiratory Therapy Supplies (FLUTTER) DEVI 1 Device by Does not apply route as needed. 1 each 0   No current facility-administered medications for this encounter.     Allergies:   Patient has no known allergies.   Social History:  The patient  reports that she has never smoked. She has never used smokeless tobacco. She reports that she does not drink alcohol.   Family History:  The patient's family history includes Diabetes in her father; Prostate cancer in her father; Sudden death (age of onset: 44) in her mother.   ROS:  Please see the history of present illness.   All other systems are personally reviewed and negative.  Vitals:   03/18/19 1042  BP: (!) 128/96  Pulse: 84  SpO2: 98%   Exam:   General:  Well appearing. No resp difficulty HEENT: normal Neck: supple. no JVD. Carotids 2+ bilat; no bruits. No lymphadenopathy or thryomegaly appreciated. Cor: PMI nondisplaced. Regular rate & rhythm. No rubs, gallops or murmurs. Lungs: clear Abdomen: soft, nontender, nondistended. No hepatosplenomegaly. No bruits or masses. Good bowel sounds. Extremities: no cyanosis, clubbing, rash, R and LLE trace edema Neuro: alert & orientedx3, cranial nerves grossly  intact. moves all 4 extremities w/o difficulty. Affect pleasant  EKG: NSR 87 bpm  Recent Labs: 09/29/2018: TSH 2.154 10/16/2018: B Natriuretic Peptide 38.4 02/05/2019: ALT 10; BUN 10; Creatinine, Ser 0.78; Hemoglobin 12.8; Magnesium 2.1; Platelets 347.0; Potassium 4.7; Sodium 141  Personally reviewed   Wt Readings from Last 3 Encounters:  03/18/19 68.8 kg (151 lb 9.6 oz)  03/13/19 67.9 kg (149 lb 12.8 oz)  02/05/19 68.6 kg (151 lb 3.2 oz)     ASSESSMENT AND PLAN:  1. 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 - VQ is negative. - RHC 10/15/18 with normal pulmonary pressures and high output physiology without evidence of intracardiac shunting so not candidate for selective pulmonary vasodilators  - Volume status stable. Continue lasix 40 mg daily.   2. Chronic hypoxic respiratory failure - likely due to [facility] and ILD  - Improved with treatment of MCTD - Referred to Pulmonary rehab.  -Followed closely by Dr [name]. Planning on HRCT in October.   3. Afib, paroxysmal - Continue Eliquis 5 mg BID - likely can decrease to 2.5 BID when she is > 84 y/o if weight continues to drop  - EKG today. Maintaining NSR.  - This patients CHA2DS2-VASc is 4.   4. Palpitations - ZioPatch with brief runs NSVT/SVT - Continue lopressor 12.5 mg twice a day.   Follow up in 3 months with Dr Haroldine Laws.  Jeanmarie Hubert, NP  03/18/2019 10:44 AM  [facility] [place] [address] [phone] (office) [phone] (fax)

## 2019-03-20 ENCOUNTER — Other Ambulatory Visit (HOSPITAL_COMMUNITY): Payer: Self-pay | Admitting: Internal Medicine

## 2019-04-10 ENCOUNTER — Encounter: Payer: Self-pay | Admitting: Internal Medicine

## 2019-04-16 ENCOUNTER — Other Ambulatory Visit (HOSPITAL_COMMUNITY): Payer: Self-pay | Admitting: *Deleted

## 2019-04-16 MED ORDER — APIXABAN 5 MG PO TABS: ORAL_TABLET | ORAL | 3 refills | Status: DC

## 2019-04-27 ENCOUNTER — Other Ambulatory Visit: Payer: Self-pay | Admitting: Internal Medicine

## 2019-04-30 ENCOUNTER — Other Ambulatory Visit: Payer: Self-pay | Admitting: *Deleted

## 2019-04-30 DIAGNOSIS — J849 Interstitial pulmonary disease, unspecified: Secondary | ICD-10-CM

## 2019-04-30 NOTE — Telephone Encounter (Signed)
Could you guys please schedule the pt's CT?

## 2019-04-30 NOTE — Telephone Encounter (Signed)
Looks like the last ct ordered was scheduled in Jan 2020 and then there wasn't anything else ordered

## 2019-05-02 ENCOUNTER — Ambulatory Visit (INDEPENDENT_AMBULATORY_CARE_PROVIDER_SITE_OTHER): Payer: Medicare Other

## 2019-05-02 ENCOUNTER — Other Ambulatory Visit: Payer: Self-pay

## 2019-05-02 DIAGNOSIS — Z23 Encounter for immunization: Secondary | ICD-10-CM | POA: Diagnosis not present

## 2019-05-22 ENCOUNTER — Other Ambulatory Visit: Payer: Self-pay

## 2019-05-22 ENCOUNTER — Ambulatory Visit (INDEPENDENT_AMBULATORY_CARE_PROVIDER_SITE_OTHER)
Admission: RE | Admit: 2019-05-22 | Discharge: 2019-05-22 | Disposition: A | Discharge status: Home / Self Care | Payer: Medicare Other | Source: Ambulatory Visit | Attending: Pulmonary Disease | Admitting: Pulmonary Disease

## 2019-05-22 DIAGNOSIS — J849 Interstitial pulmonary disease, unspecified: Secondary | ICD-10-CM | POA: Diagnosis not present

## 2019-06-05 ENCOUNTER — Encounter: Payer: Self-pay | Admitting: Pulmonary Disease

## 2019-06-05 ENCOUNTER — Other Ambulatory Visit: Payer: Self-pay

## 2019-06-05 ENCOUNTER — Ambulatory Visit: Payer: Medicare Other | Admitting: Pulmonary Disease

## 2019-06-05 DIAGNOSIS — J9611 Chronic respiratory failure with hypoxia: Secondary | ICD-10-CM

## 2019-06-05 DIAGNOSIS — J849 Interstitial pulmonary disease, unspecified: Secondary | ICD-10-CM | POA: Diagnosis not present

## 2019-06-05 NOTE — Patient Instructions (Addendum)
CT scan appears much improved.  Decrease prednisone to 5 mg Monday/Wednesday/Friday  Call me if worse  PFTs next visit

## 2019-06-05 NOTE — Assessment & Plan Note (Signed)
CT scan appears much improved.  Symptomatically improved  Decrease prednisone to 5 mg Monday/Wednesday/Friday  Call me if worse  PFTs next visit-we will consider stopping prednisone if she is doing well. Differential diagnosis for her shortness of breath here is anxiety -since she has an oximeter, she will keep track of her saturations

## 2019-06-05 NOTE — Progress Notes (Signed)
   Subjective:    Patient ID: Sabrina Douglas, female    DOB: [birth year], 78 y.o.   MRN: [id number]  HPI  78 year old never smoker for follow-up of CT -ILD Symptom onset around 05/2018    HRCT confirmed ILD probable UIP pattern. 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 Echo showed pulmonary hypertension and she was admitted 10/15/18 and underwent right heart cath   She was hospitalized 10/2018 for dyspnea, . She had new onset atrial fibrillation with RVR which was rate controlled with amiodarone and metoprolol,started on Eliquis Sees Dr. [name] -initially started on Plaquenil and 10 mg of prednisone, this was decreased to 5 mg in April  Stable interval for her breathing is mostly improved continues on 5 mg of prednisone. Coughing is resolved. We reviewed HRCT results today which shows marked improvement in infiltrates and bibasilar bronchiectasis, she just had a visit with Dr. [name] which we reviewed, no signs of arthritis or skin rash She is using oxygen as needed in the daytime and compliant during sleep She admits to having anxiety on occasion which she has had for many years Accompanied by daughter and all questions answered   Significant tests/ events reviewed  ANA positive1: 1280,Sm Ab 2.0, RNP > 8.0 ,CCP negative 10/02/2018 PFTs ratio 92, moderate to severe restriction FEV1 66%, FVC 56%, TLC 46%, DLCO 17% corrects to 51% for alveolar volume  Echo 2/2020RVSP 67 Cath 3/2020RA 3, PA 39/7 , PVR2.1 Wu  HRCT 05/2019 Bilateral lower lobe predominant bronchiectasis and volume loss may be postinfectious or post inflammatory in etiology 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 Benign stricture  Past Medical History:  Diagnosis Date  . Anxiety   . Depression   . Hyperlipidemia      Review of Systems neg for any significant sore throat, dysphagia, itching,  sneezing, nasal congestion or excess/ purulent secretions, fever, chills, sweats, unintended wt loss, pleuritic or exertional cp, hempoptysis, orthopnea pnd or change in chronic leg swelling. Also denies presyncope, palpitations, heartburn, abdominal pain, nausea, vomiting, diarrhea or change in bowel or urinary habits, dysuria,hematuria, rash, arthralgias, visual complaints, headache, numbness weakness or ataxia.     Objective:   Physical Exam  Gen. Pleasant, elderly, well-nourished, in no distress, normal affect ENT - no pallor,icterus, no post nasal drip Neck: No JVD, no thyromegaly, no carotid bruits Lungs: no use of accessory muscles, no dullness to percussion, right basal rales no rhonchi  Cardiovascular: Rhythm regular, heart sounds  normal, no murmurs or gallops, no peripheral edema Abdomen: soft and non-tender, no hepatosplenomegaly, BS normal. Musculoskeletal: No deformities, no cyanosis or clubbing Neuro:  alert, non focal       Assessment & Plan:

## 2019-06-05 NOTE — Assessment & Plan Note (Signed)
Continue oxygen use during sleep and minimize daytime use. Check: ONO  next visit to see if she still needs this

## 2019-06-11 ENCOUNTER — Ambulatory Visit: Payer: Medicare Other | Admitting: Internal Medicine

## 2019-06-18 ENCOUNTER — Other Ambulatory Visit: Payer: Self-pay

## 2019-06-18 ENCOUNTER — Encounter (HOSPITAL_COMMUNITY): Payer: Self-pay | Admitting: Internal Medicine

## 2019-06-18 ENCOUNTER — Ambulatory Visit (HOSPITAL_COMMUNITY)
Admission: RE | Admit: 2019-06-18 | Discharge: 2019-06-18 | Disposition: A | Discharge status: Home / Self Care | Payer: Medicare Other | Source: Ambulatory Visit | Attending: Internal Medicine | Admitting: Internal Medicine

## 2019-06-18 VITALS — BP 142/84 | HR 89 | Wt 155.0 lb

## 2019-06-18 DIAGNOSIS — I272 Pulmonary hypertension, unspecified: Secondary | ICD-10-CM

## 2019-06-18 DIAGNOSIS — I471 Supraventricular tachycardia: Secondary | ICD-10-CM | POA: Insufficient documentation

## 2019-06-18 DIAGNOSIS — I509 Heart failure, unspecified: Secondary | ICD-10-CM | POA: Diagnosis present

## 2019-06-18 DIAGNOSIS — I480 Paroxysmal atrial fibrillation: Secondary | ICD-10-CM | POA: Diagnosis not present

## 2019-06-18 DIAGNOSIS — J479 Bronchiectasis, uncomplicated: Secondary | ICD-10-CM | POA: Diagnosis not present

## 2019-06-18 DIAGNOSIS — R002 Palpitations: Secondary | ICD-10-CM | POA: Diagnosis not present

## 2019-06-18 DIAGNOSIS — I472 Ventricular tachycardia: Secondary | ICD-10-CM | POA: Diagnosis not present

## 2019-06-18 DIAGNOSIS — Z79899 Other long term (current) drug therapy: Secondary | ICD-10-CM | POA: Insufficient documentation

## 2019-06-18 DIAGNOSIS — E785 Hyperlipidemia, unspecified: Secondary | ICD-10-CM | POA: Diagnosis not present

## 2019-06-18 DIAGNOSIS — J9611 Chronic respiratory failure with hypoxia: Secondary | ICD-10-CM | POA: Diagnosis not present

## 2019-06-18 DIAGNOSIS — M351 Other overlap syndromes: Secondary | ICD-10-CM

## 2019-06-18 DIAGNOSIS — Z7901 Long term (current) use of anticoagulants: Secondary | ICD-10-CM | POA: Diagnosis not present

## 2019-06-18 DIAGNOSIS — M349 Systemic sclerosis, unspecified: Secondary | ICD-10-CM | POA: Insufficient documentation

## 2019-06-18 DIAGNOSIS — F419 Anxiety disorder, unspecified: Secondary | ICD-10-CM | POA: Insufficient documentation

## 2019-06-18 DIAGNOSIS — F329 Major depressive disorder, single episode, unspecified: Secondary | ICD-10-CM | POA: Diagnosis not present

## 2019-06-18 NOTE — Progress Notes (Signed)
Date:  06/18/2019   ID:  [name], Douglas 07/14/1941, MRN [id number]  Location: Home  Provider location: [facility] Type of Visit: Established patient  PCP:  Isaac Bliss, Rayford Halsted, MD  Cardiologist:  No primary care provider on file. Primary HF: [place]  Chief Complaint: Heart Failure follow-up   History of Present Illness:  HPI: Sabrina Douglas is a 78 y.o. female with h/ohypertension, hyperlipidemia, anxiety/depression, and ILD due MCTD.   Patient seen for initial consult with Dr. [name] on 08/28/18. CXR showed mildly worsened bibasilar pneumonia, suggestive of atypical infection d/t waxing/weaning symptoms. 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 Ordered for HRCT, concern for ILD. RX short course prednisone. ANA positive, CCP negative. 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  Admitted 2/26-2/28/20 with severe weakness. Fount to havehyponatremia at114,dysphagia and about 20 pounds weight loss in the months. Treated with 3% saline and eventually demeclocycline. Prior to d/c had VQ and echo as above. Tested for need for home O2 and was told she didn't need it.   Pt admitted 3/10 - 10/17/18 with new onset Afib. Converted back to NSR with amiodarone drip and was discontinued. Toprol resumed for home and Holter monitor ordered. Eliquis started.  She returns for HF follow up. Here with her daughter. Says her breathing feels better. Follows with Dr. [name] & [name]. 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 Was on plaquenil/prednisone. Now on prednisone 56m qod. Hires CT improving. Wearing oxygen more in the evening and every night. Uses pulse ox 94-99%. Mild LE edema but improved over past few weeks with compression hose. No orthopnea or PND. Rare palpitations. Follows BP and it has been well controlled. On apixaban - no bleeding.   HiRes CT 10/20 1. Bilateral lower lobe  predominant bronchiectasis and volume loss may be postinfectious or post inflammatory in etiology. Findings are suggestive of an alternative diagnosis (not UIP) per consensus guidelines: Diagnosis of Idiopathic Pulmonary Fibrosis   Zio Patch 11/28/18  1. Predominant rhythm was sinus. Patient had a min HR of 54 bpm, max HR of 214 bpm, and avg HR of 96 bpm. 2. Two brief runs of NSVT - longest was 8 beats 3. Forty five brief runs of SVT. The run with the fastest interval lasting 5 beats with a max rate of 214 bpm, the longest lasting 15.7 secs with an avg rate of 154 bpm. 4. Rare PVCs   RHC 10/15/18 RA = 3 RV = 39/4 PA = 39/7 (18) PCW = 4 Fick cardiac output/index = 6.5/3.8 PVR = 2.1 WU Ao sat = 100% PA sat = 81%, 82% High SVC and axillary sats both 81%  Assessment: 1. Normal pulmonary pressures 2. High output physiology without evidence of intracardiac shunting   Labs  ANA1:1280(pos) ESR 104  RF 55(pos) RNP > 8.0(pos) AntiJo negative CCP < 16 Anti-Smith 2.0 (pos) Scl-70 < 1.0 ANCA negative  HRCT 09/10/18 1. 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 Repeat high-resolution chest CT is recommended in 12 months to assess for temporal changes in the appearance of the lung parenchyma. 2. Aortic atherosclerosis.  Echo 09/30/18 1. The left ventricle has hyperdynamic systolic function, with an ejection fraction of >65%. The cavity size was normal. Left ventricular diastolic parameters were normal No evidence of left ventricular regional wall motion abnormalities. 2. No evidence of left  ventricular regional wall motion abnormalities. 3. The right ventricle has normal systolic function. The cavity was grossly normal. RA-RV peak gradient increased at 66.9 mmHg consistent with probable severe pulmonary hypertension. Could not estimate CVP however. 4. The aortic  valve is tricuspid Mild aortic annular calcification noted. 5. The mitral valve is normal in structure. 6. The tricuspid valve is normal in structure. Tricuspid valve regurgitation is  PFTs (10/02/18) FEV1 1.13 (66%) FVC 1.23 (56%) DLCO 16%  VQ scan 10/03/18: Negative    Analeese Andreatta denies symptoms worrisome for COVID 19.   Past Medical History:  Diagnosis Date  . Anxiety   . Depression   . Hyperlipidemia    Past Surgical History:  Procedure Laterality Date  . BALLOON DILATION N/A 10/04/2018   Procedure: BALLOON DILATION;  Surgeon: Otis Brace, MD;  Location: [facility];  Service: Gastroenterology;  Laterality: N/A;  . BIOPSY  10/04/2018   Procedure: BIOPSY;  Surgeon: Otis Brace, MD;  Location: [facility];  Service: Gastroenterology;;  . ESOPHAGOGASTRODUODENOSCOPY (EGD) WITH PROPOFOL N/A 10/04/2018   Procedure: ESOPHAGOGASTRODUODENOSCOPY (EGD) WITH PROPOFOL;  Surgeon: Otis Brace, MD;  Location: [facility];  Service: Gastroenterology;  Laterality: N/A;  . RIGHT HEART CATH N/A 10/15/2018   Procedure: RIGHT HEART CATH;  Surgeon: Jolaine Artist, MD;  Location: [facility];  Service: Cardiovascular;  Laterality: N/A;     Current Outpatient Medications  Medication Sig Dispense Refill  . albuterol (PROVENTIL HFA;VENTOLIN HFA) 108 (90 Base) MCG/ACT inhaler Inhale 2 puffs into the lungs every 6 (six) hours as needed for wheezing or shortness of breath. 1 Inhaler 6  . ALPRAZolam (XANAX) 0.25 MG tablet Take 0.125-0.25 mg by mouth 2 (two) times daily as needed for anxiety.     Marland Kitchen apixaban (ELIQUIS) 5 MG TABS tablet TAKE 1 TABLET(5 MG) BY MOUTH TWICE DAILY 60 tablet 3  . furosemide (LASIX) 40 MG tablet TAKE 1 TABLET BY MOUTH DAILY 90 tablet 1  . Glycerin-Hypromellose-PEG 400 ([employer] DRY EYE RELIEF) 0.2-0.2-1 % SOLN Place 2 drops into both eyes as needed (Dry eyes).    . hydroxychloroquine (PLAQUENIL) 200 MG tablet Take 1 tablet by mouth 2 (two)  times a day.    . metoprolol tartrate (LOPRESSOR) 25 MG tablet Take 0.5 tablets (12.5 mg total) by mouth 2 (two) times daily. 180 tablet 1  . Multiple Vitamin (MULTIVITAMIN WITH MINERALS) TABS tablet Take 1 tablet by mouth daily.    . pantoprazole (PROTONIX) 20 MG tablet TK 1 T PO QAM    . predniSONE (DELTASONE) 5 MG tablet Take 5 mg by mouth every other day.     Marland Kitchen Respiratory Therapy Supplies (FLUTTER) DEVI 1 Device by Does not apply route as needed. 1 each 0   No current facility-administered medications for this encounter.     Allergies:   Patient has no known allergies.   Social History:  The patient  reports that she is a non-smoker but has been exposed to tobacco smoke. She has never used smokeless tobacco. She reports that she does not drink alcohol.   Family History:  The patient's family history includes Diabetes in her father; Prostate cancer in her father; Sudden death (age of onset: 74) in her mother.   ROS:  Please see the history of present illness.   All other systems are personally reviewed and negative.  Vitals:   06/18/19 1122  BP: (!) 142/84  Pulse: 89  SpO2: 98%   Weight 155 pounds   Exam:  General:  Well appearing. No resp difficulty HEENT: normal Neck: supple. no JVD. Carotids 2+ bilat; no bruits. No lymphadenopathy or thryomegaly appreciated. Cor: PMI nondisplaced. Regular rate & rhythm. No rubs, gallops or murmurs. Lungs: diffuse dry crackles. Worse at bases Abdomen: soft, nontender, nondistended. No hepatosplenomegaly. No bruits or masses. Good bowel sounds. Extremities: no cyanosis, clubbing, rash, edema Neuro: alert & orientedx3, cranial nerves grossly intact. moves all 4 extremities w/o difficulty. Affect pleasant   EKG: NSR 77 bpm occ PVC Personally reviewed  Recent Labs: 09/29/2018: TSH 2.154 10/16/2018: B Natriuretic Peptide 38.4 02/05/2019: ALT 10; BUN 10; Creatinine, Ser 0.78; Hemoglobin 12.8; Magnesium 2.1; Platelets 347.0; Potassium 4.7;  Sodium 141  Personally reviewed   Wt Readings from Last 3 Encounters:  06/18/19 70.3 kg (155 lb)  06/05/19 70.6 kg (155 lb 9.6 oz)  03/18/19 68.8 kg (151 lb 9.6 oz)     ASSESSMENT AND PLAN:  1. 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 [name] in Pulmonary - Much improved with steroids. - VQ is negative. - RHC 10/15/18 with normal pulmonary pressures and high output physiology without evidence of intracardiac shunting so not candidate for selective pulmonary vasodilators  - Volume status stable. Continue lasix 40 mg daily.   2. Chronic hypoxic respiratory failure - likely due to ILD & less PAH - Much improved with treatment of MCTD - Referred to Pulmonary rehab.  - Followed closely by Dr [name]. Repeat PFTs pending  3. Afib, paroxysmal - In NSR today - Continue Eliquis 5 mg BID. She will be 78 y/o tomorrow but weight and renal function ok so continue 5 bid - This patients CHA2DS2-VASc is 4.   4. Palpitations - ZioPatch with brief runs NSVT/SVT - Continue lopressor 12.5 mg twice a day. Can take extra if having palpitations.    Signed, Glori Bickers, MD  06/18/2019 11:53 AM  Advanced [facility] Failure [place] [address] [phone] (office) [phone] (fax)

## 2019-06-18 NOTE — Patient Instructions (Signed)
Please call our office in early 2021 to schedule your follow up appointment for May 2021.  If you have any questions or concerns before your next appointment please send [place] a message through [place] or call our office at [phone].  At the [facility], you and your health needs are our priority. As part of our continuing mission to provide you with exceptional heart care, we have created designated Provider Care Teams. These Care Teams include your primary Cardiologist (physician) and Advanced Practice Providers (APPs- Physician Assistants and Nurse Practitioners) who all work together to provide you with the care you need, when you need it.   You may see any of the following providers on your designated Care Team at your next follow up: Marland Kitchen Dr Glori Bickers . Dr Loralie Champagne . Darrick Grinder, NP . Lyda Jester, PA   Please be sure to bring in all your medications bottles to every appointment.

## 2019-06-26 ENCOUNTER — Other Ambulatory Visit: Payer: Self-pay

## 2019-06-27 ENCOUNTER — Ambulatory Visit: Payer: Medicare Other | Admitting: Internal Medicine

## 2019-06-27 ENCOUNTER — Encounter: Payer: Self-pay | Admitting: Internal Medicine

## 2019-06-27 VITALS — BP 124/84 | HR 72 | Temp 97.3°F | Wt 155.1 lb

## 2019-06-27 DIAGNOSIS — I10 Essential (primary) hypertension: Secondary | ICD-10-CM

## 2019-06-27 DIAGNOSIS — K219 Gastro-esophageal reflux disease without esophagitis: Secondary | ICD-10-CM

## 2019-06-27 DIAGNOSIS — I4891 Unspecified atrial fibrillation: Secondary | ICD-10-CM | POA: Diagnosis not present

## 2019-06-27 DIAGNOSIS — J9611 Chronic respiratory failure with hypoxia: Secondary | ICD-10-CM

## 2019-06-27 DIAGNOSIS — I272 Pulmonary hypertension, unspecified: Secondary | ICD-10-CM | POA: Diagnosis not present

## 2019-06-27 DIAGNOSIS — E871 Hypo-osmolality and hyponatremia: Secondary | ICD-10-CM

## 2019-06-27 DIAGNOSIS — B372 Candidiasis of skin and nail: Secondary | ICD-10-CM

## 2019-06-27 DIAGNOSIS — E440 Moderate protein-calorie malnutrition: Secondary | ICD-10-CM

## 2019-06-27 DIAGNOSIS — J849 Interstitial pulmonary disease, unspecified: Secondary | ICD-10-CM

## 2019-06-27 LAB — BASIC METABOLIC PANEL
BUN: 12 mg/dL (ref 6–23)
CO2: 32 mEq/L (ref 19–32)
Calcium: 10.4 mg/dL (ref 8.4–10.5)
Chloride: 103 mEq/L (ref 96–112)
Creatinine, Ser: 0.86 mg/dL (ref 0.40–1.20)
GFR: 76.82 mL/min (ref >60.00)
Glucose, Bld: 73 mg/dL (ref 70–99)
Potassium: 4.4 mEq/L (ref 3.5–5.1)
Sodium: 143 mEq/L (ref 135–145)

## 2019-06-27 LAB — MAGNESIUM: Magnesium: 2.1 mg/dL (ref 1.5–2.5)

## 2019-06-27 MED ORDER — NYSTATIN 100000 UNIT/GM EX POWD: Freq: Four times a day (QID) | CUTANEOUS | 1 refills | Status: DC

## 2019-06-27 MED ORDER — PANTOPRAZOLE SODIUM 20 MG PO TBEC: DELAYED_RELEASE_TABLET | ORAL | 1 refills | Status: DC

## 2019-06-27 NOTE — Patient Instructions (Signed)
-  Lab work today; will notify you once results are available.  -Nice seeing you today!!  -Apply nystatin powder to clean, dry skin under breasts.  -Schedule follow up in 3-4 months.

## 2019-06-27 NOTE — Progress Notes (Signed)
Established Patient Office Visit     CC/Reason for Visit: She is here for her 50-month follow-up visit.  HPI: Sabrina Douglas is a 78 y.o. female who is coming in today for the above mentioned reasons. Past Medical History is significant for: Hypertension that has been well controlled, well-controlled hyperlipidemia, history of atrial fibrillation that has been rate controlled and followed by Dr. Jolaine Artist on.  She also has a history of interstitial lung disease and pulmonary hypertension followed by Dr. Cloyd Stagers and Dr. [name] with rheumatology.  They have been able to titrate her prednisone down to 5 mg every other day she seems to be doing well.  Her appetite has been waning a little bit now that her prednisone is being titrated, however her weight has remained stable.  Daughter is interested in getting lab work today to follow her electrolytes due to diuretic therapy.  She feels like she is doing well.  She would like me to evaluate medication under her left breast: Sometimes it hurts sometimes it is itchy.   Past Medical/Surgical History: Past Medical History:  Diagnosis Date  . Anxiety   . Depression   . Hyperlipidemia     Past Surgical History:  Procedure Laterality Date  . BALLOON DILATION N/A 10/04/2018   Procedure: BALLOON DILATION;  Surgeon: Otis Brace, MD;  Location: [facility];  Service: Gastroenterology;  Laterality: N/A;  . BIOPSY  10/04/2018   Procedure: BIOPSY;  Surgeon: Otis Brace, MD;  Location: [facility];  Service: Gastroenterology;;  . ESOPHAGOGASTRODUODENOSCOPY (EGD) WITH PROPOFOL N/A 10/04/2018   Procedure: ESOPHAGOGASTRODUODENOSCOPY (EGD) WITH PROPOFOL;  Surgeon: Otis Brace, MD;  Location: [facility];  Service: Gastroenterology;  Laterality: N/A;  . RIGHT HEART CATH N/A 10/15/2018   Procedure: RIGHT HEART CATH;  Surgeon: Jolaine Artist, MD;  Location: [facility];  Service: Cardiovascular;  Laterality: N/A;    Social  History:  reports that she is a non-smoker but has been exposed to tobacco smoke. She has never used smokeless tobacco. She reports that she does not drink alcohol. No history on file for drug.  Allergies: No Known Allergies  Family History:  Family History  Problem Relation Age of Onset  . Sudden death Mother 67       sudden death post hysterectomy  . Prostate cancer Father   . Diabetes Father      Current Outpatient Medications:  .  albuterol (PROVENTIL HFA;VENTOLIN HFA) 108 (90 Base) MCG/ACT inhaler, Inhale 2 puffs into the lungs every 6 (six) hours as needed for wheezing or shortness of breath., Disp: 1 Inhaler, Rfl: 6 .  ALPRAZolam (XANAX) 0.25 MG tablet, Take 0.125-0.25 mg by mouth 2 (two) times daily as needed for anxiety. , Disp: , Rfl:  .  apixaban (ELIQUIS) 5 MG TABS tablet, TAKE 1 TABLET(5 MG) BY MOUTH TWICE DAILY, Disp: 60 tablet, Rfl: 3 .  furosemide (LASIX) 40 MG tablet, TAKE 1 TABLET BY MOUTH DAILY, Disp: 90 tablet, Rfl: 1 .  Glycerin-Hypromellose-PEG 400 ([facility] DRY EYE RELIEF) 0.2-0.2-1 % SOLN, Place 2 drops into both eyes as needed (Dry eyes)., Disp: , Rfl:  .  hydroxychloroquine (PLAQUENIL) 200 MG tablet, Take 1 tablet by mouth 2 (two) times a day., Disp: , Rfl:  .  metoprolol tartrate (LOPRESSOR) 25 MG tablet, Take 0.5 tablets (12.5 mg total) by mouth 2 (two) times daily., Disp: 180 tablet, Rfl: 1 .  Multiple Vitamin (MULTIVITAMIN WITH MINERALS) TABS tablet, Take 1 tablet by mouth daily., Disp: ,  Rfl:  .  pantoprazole (PROTONIX) 20 MG tablet, TK 1 T PO QAM, Disp: 90 tablet, Rfl: 1 .  predniSONE (DELTASONE) 5 MG tablet, Take 5 mg by mouth every other day. , Disp: , Rfl:  .  Respiratory Therapy Supplies (FLUTTER) DEVI, 1 Device by Does not apply route as needed., Disp: 1 each, Rfl: 0 .  nystatin (MYCOSTATIN/NYSTOP) powder, Apply topically 4 (four) times daily., Disp: 15 g, Rfl: 1  Review of Systems:  Constitutional: Denies fever, chills, diaphoresis, appetite change and  fatigue.  HEENT: Denies photophobia, eye pain, redness, hearing loss, ear pain, congestion, sore throat, rhinorrhea, sneezing, mouth sores, trouble swallowing, neck pain, neck stiffness and tinnitus.   Respiratory: Denies SOB, DOE, cough, chest tightness,  and wheezing.   Cardiovascular: Denies chest pain, palpitations and leg swelling.  Gastrointestinal: Denies nausea, vomiting, abdominal pain, diarrhea, constipation, blood in stool and abdominal distention.  Genitourinary: Denies dysuria, urgency, frequency, hematuria, flank pain and difficulty urinating.  Endocrine: Denies: hot or cold intolerance, sweats, changes in hair or nails, polyuria, polydipsia. Musculoskeletal: Denies myalgias, back pain, joint swelling, arthralgias and gait problem.  Skin: Denies pallor and wound.  Neurological: Denies dizziness, seizures, syncope, weakness, light-headedness, numbness and headaches.  Hematological: Denies adenopathy. Easy bruising, personal or family bleeding history  Psychiatric/Behavioral: Denies suicidal ideation, mood changes, confusion, nervousness, sleep disturbance and agitation    Physical Exam: Vitals:   06/27/19 1051  BP: 124/84  Pulse: 72  Temp: (!) 97.3 F (36.3 C)  TempSrc: Temporal  SpO2: 94%  Weight: 155 lb 1.6 oz (70.4 kg)    Body mass index is 26.62 kg/m.   Constitutional: NAD, calm, comfortable Eyes: PERRL, lids and conjunctivae normal, wears corrective lenses ENMT: Mucous membranes are moist.  Respiratory: clear to auscultation bilaterally, no wheezing, no crackles. Normal respiratory effort. No accessory muscle use.  Cardiovascular: Irregular rhythm, regular rate, no murmurs / rubs / gallops. No extremity edema. 2+ pedal pulses. .  Abdomen: no tenderness, no masses palpated. No hepatosplenomegaly. Bowel sounds positive.  Musculoskeletal: no clubbing / cyanosis. No joint deformity upper and lower extremities. Good ROM, no contractures. Normal muscle tone.  Skin:  Erythematous, "slimy" rash under her left breast. Neurologic: Grossly intact and nonfocal Psychiatric: Normal judgment and insight. Alert and oriented x 3. Normal mood.    Impression and Plan:  Chronic respiratory failure with hypoxia (HCC) Pulmonary hypertension, unspecified (HCC) ILD (interstitial lung disease) (HCC) -Followed by rheumatology and pulmonology. -Prednisone is being titrated, she has been weaned off oxygen.  She is doing well, has had improved exercise tolerance.  New onset a-fib [facility]) -She is anticoagulated on Eliquis and rate controlled on metoprolol.  Essential hypertension  -Well-controlled, check basic metabolic profile and magnesium today due to diuretic therapy.  Malnutrition of moderate degree -Her weight remains stable at 156 pounds.  Hyponatremia -Check sodium levels today.  Gastroesophageal reflux disease without esophagitis  -Well-controlled on PPI therapy.  Yeast dermatitis  -She will apply nystatin powder twice a day to clean dry skin.    Patient Instructions  -Lab work today; will notify you once results are available.  -Nice seeing you today!!  -Apply nystatin powder to clean, dry skin under breasts.  -Schedule follow up in 3-4 months.      Chaya Jan, MD [facility]

## 2019-08-09 IMAGING — RF DG ESOPHAGUS
9 series · 22 of 24 positions shown · non-contrast
Comparison: None.

CLINICAL DATA: Dysphagia.

EXAM:
ESOPHOGRAM/BARIUM SWALLOW
TECHNIQUE: Single contrast examination was performed using thin barium or water
soluble.
FLUOROSCOPY TIME:  Fluoroscopy Time:  1 minutes 36 seconds
Radiation Exposure Index (if provided by the fluoroscopic device):
16.5
Number of Acquired Spot Images: 0

[Series 1: cp_standard · 0.53mm/px · 3 of 155 frames shown (1 of 9)]
[frame 24/155]
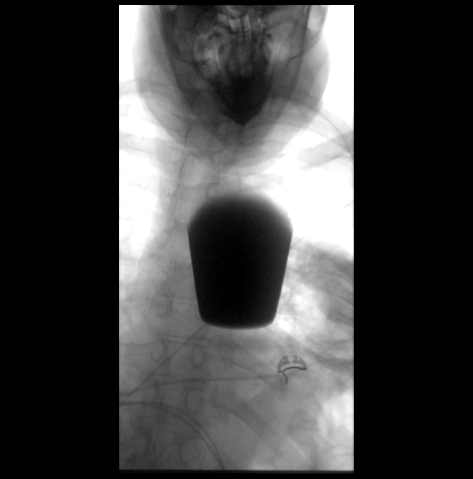
[frame 91/155]
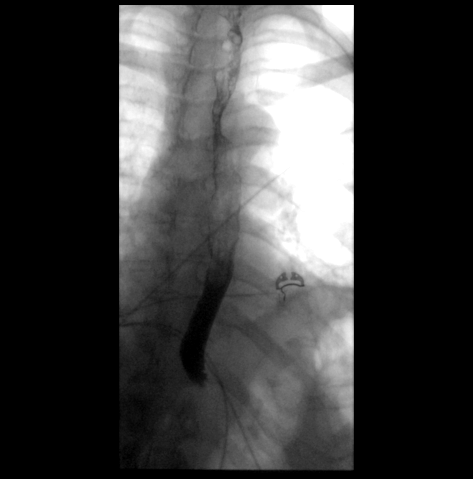
[frame 132/155]
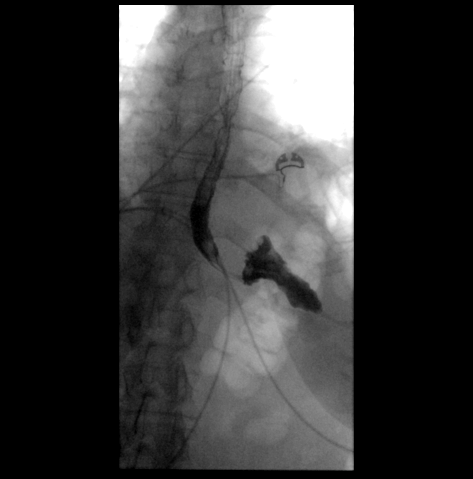

[Series 2: cp_standard · 0.53mm/px · 2 of 87 frames shown (2 of 9)]
[frame 26/87]
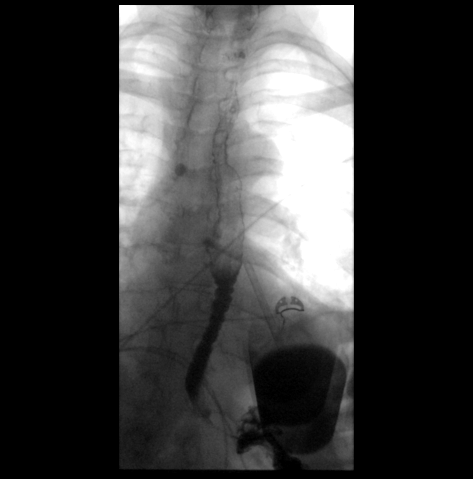
[frame 44/87]
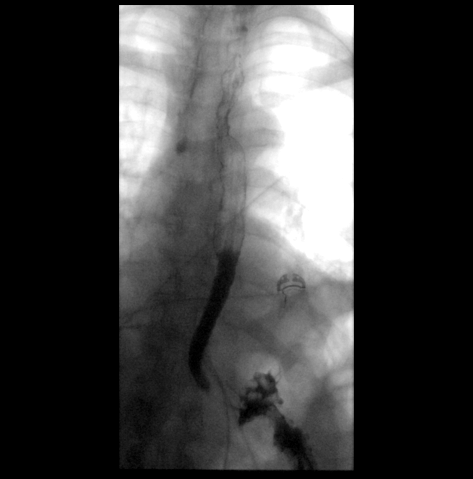

[Series 3: cp_standard · 0.53mm/px · 2 of 12 frames shown (3 of 9)]
[frame 7/12]
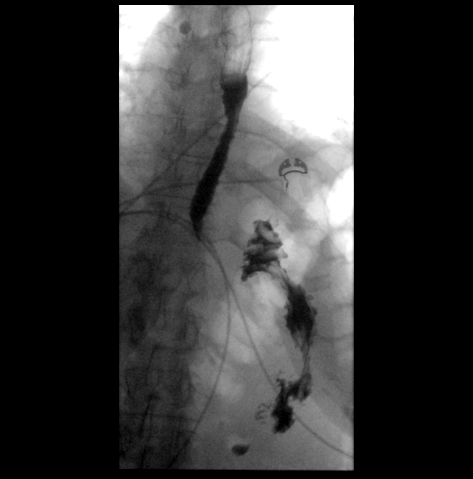
[frame 11/12]
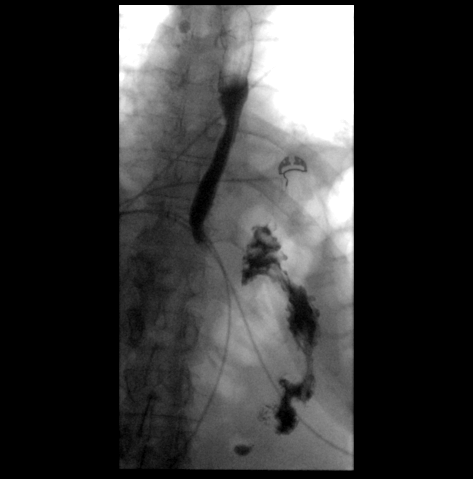

[Series 4: cp_standard · 0.53mm/px · 3 of 48 frames shown (4 of 9)]
[frame 1/48]
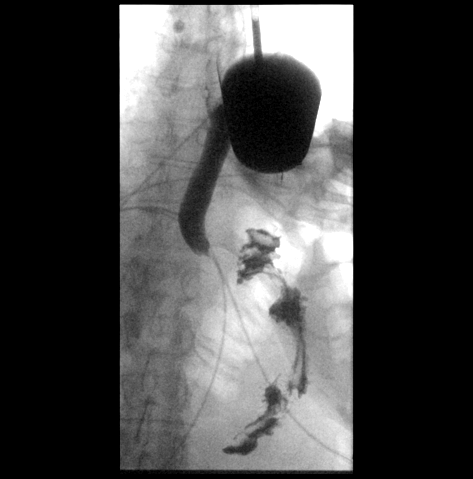
[frame 25/48]
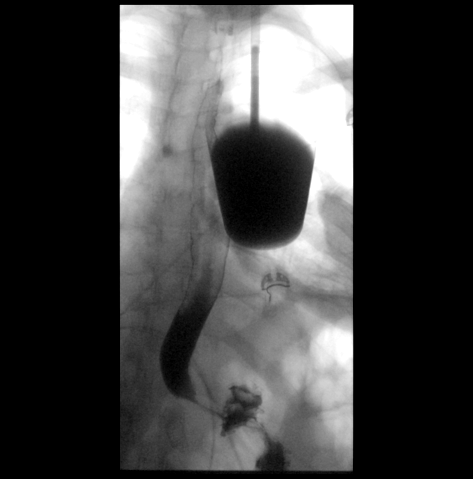
[frame 41/48]
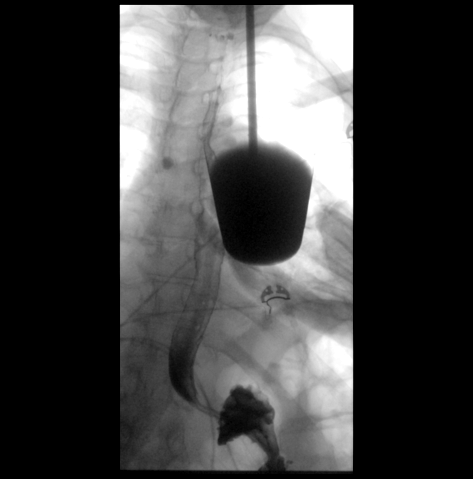

[Series 5: cp_standard · 0.53mm/px · 2 of 131 frames shown (5 of 9)]
[frame 62/131]
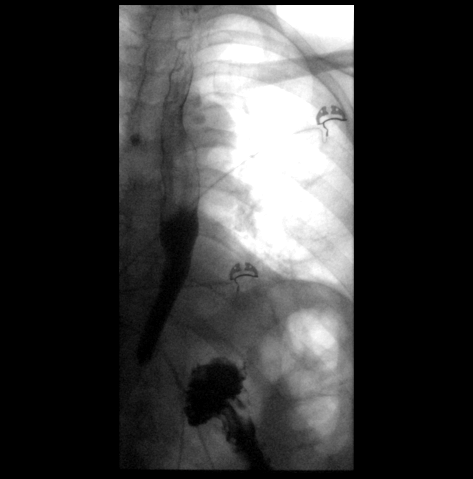
[frame 66/131]
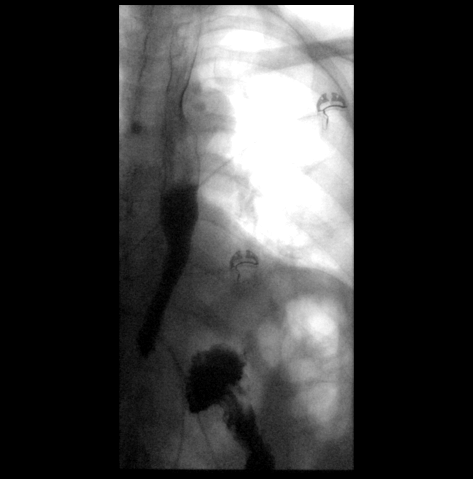

[Series 6: cp_standard · 0.54mm/px · 3 of 168 frames shown (6 of 9)]
[frame 26/168]
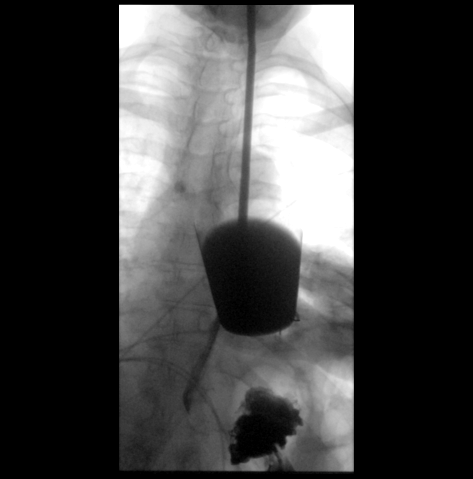
[frame 85/168]
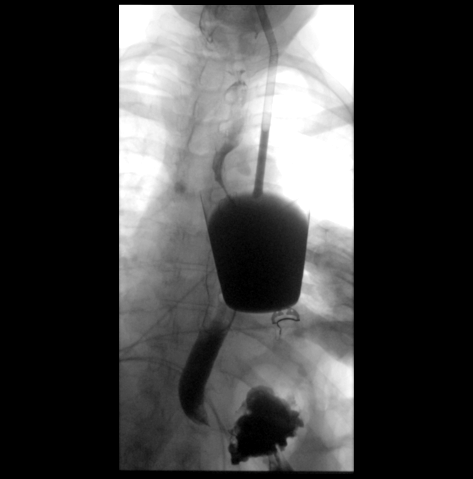
[frame 152/168]
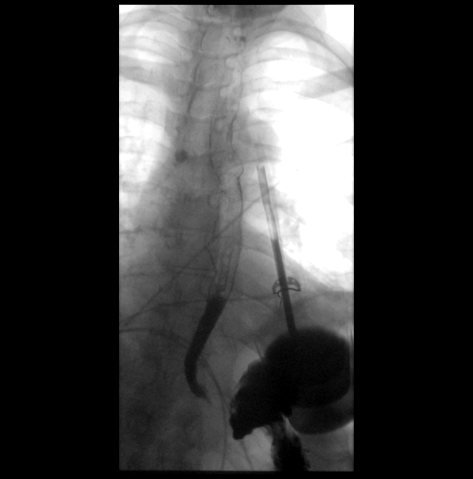

[Series 7: cp_standard · 0.54mm/px · 2 of 71 frames shown (7 of 9)]
[frame 1/71]
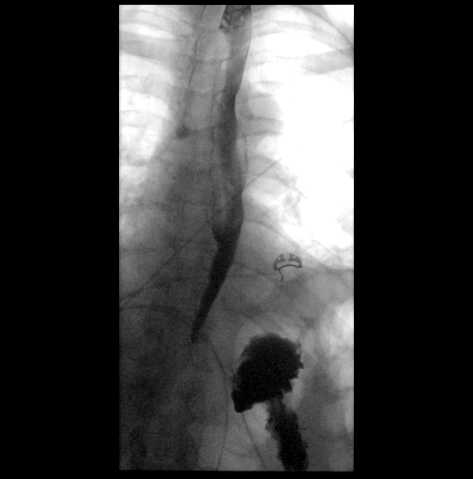
[frame 61/71]
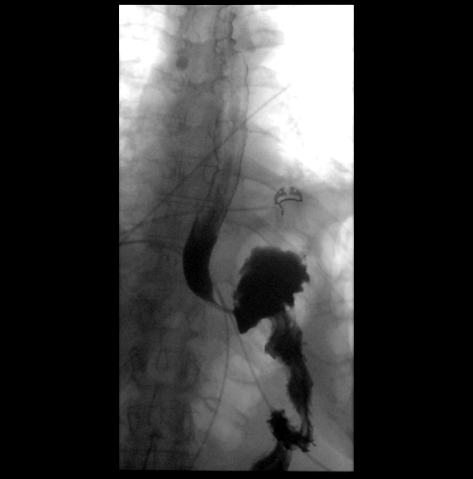

[Series 8: cp_standard · 0.55mm/px · 2 of 161 frames shown (8 of 9)]
[frame 68/161]
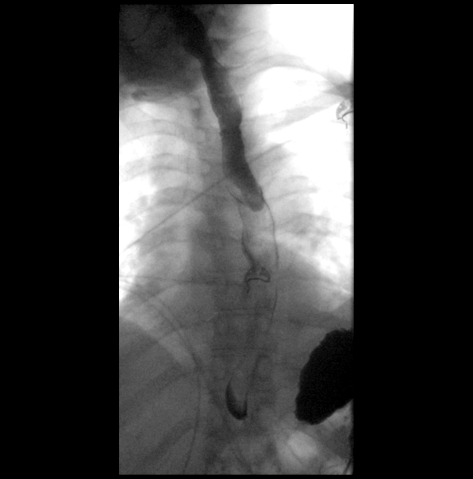
[frame 81/161]
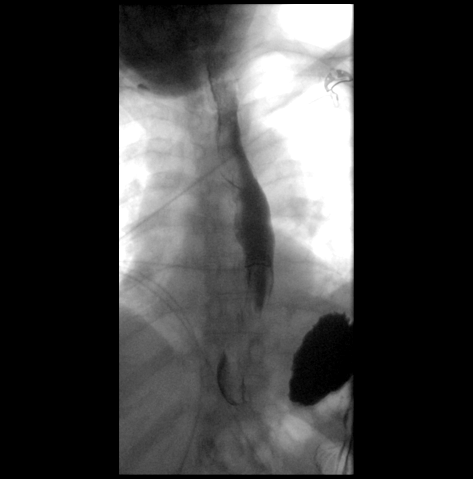

[Series 9: cp_standard · 0.55mm/px · 3 of 15 frames shown (9 of 9)]
[frame 3/15]
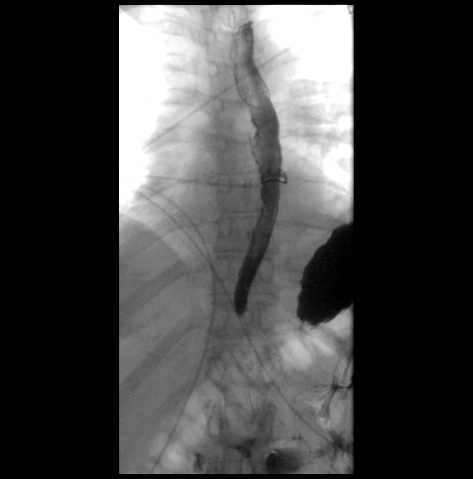
[frame 7/15]
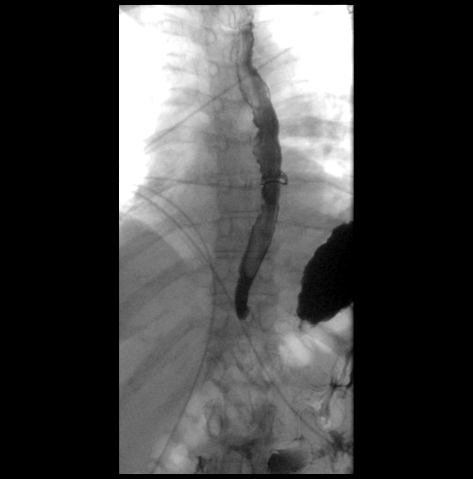
[frame 13/15]
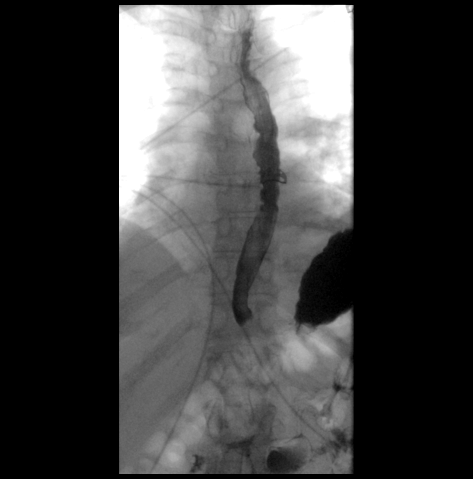

[22 of 24 positions shown; findings below may reference images not displayed]

FINDINGS: The study is limited due to patient condition. The patient could not
fully stand and could not drink quickly.

There is altered peristalsis with slow transit of the barium through
the esophagus. There is smooth narrowing of the distal esophagus
throughout the study is which never completely opens up. No obvious
mass. Limited views in the mucosa are unremarkable. Limited views of
the stomach are normal.

The patient could not tolerate swelling and tablet.
IMPRESSION: 1. There is smooth narrowing of the distal esophagus which never
opens up. The findings are suspicious or a benign
stricture/narrowing, resulting in the patient's symptoms.
Peristalsis is considered less likely as the finding is seen on
multiple occasions and the distal esophagus did not open up despite
multiple attempts. However, the patient could not tolerate a tablet
for confirmation of a distal stricture.

## 2019-08-12 ENCOUNTER — Other Ambulatory Visit (HOSPITAL_COMMUNITY): Payer: Self-pay

## 2019-08-12 MED ORDER — APIXABAN 5 MG PO TABS: ORAL_TABLET | ORAL | 3 refills | Status: DC

## 2019-08-20 DIAGNOSIS — J849 Interstitial pulmonary disease, unspecified: Secondary | ICD-10-CM | POA: Diagnosis not present

## 2019-08-20 DIAGNOSIS — J9611 Chronic respiratory failure with hypoxia: Secondary | ICD-10-CM | POA: Diagnosis not present

## 2019-08-20 DIAGNOSIS — I270 Primary pulmonary hypertension: Secondary | ICD-10-CM | POA: Diagnosis not present

## 2019-08-20 DIAGNOSIS — I272 Pulmonary hypertension, unspecified: Secondary | ICD-10-CM | POA: Diagnosis not present

## 2019-09-17 DIAGNOSIS — J849 Interstitial pulmonary disease, unspecified: Secondary | ICD-10-CM | POA: Diagnosis not present

## 2019-09-17 DIAGNOSIS — I272 Pulmonary hypertension, unspecified: Secondary | ICD-10-CM | POA: Diagnosis not present

## 2019-09-17 DIAGNOSIS — Z6827 Body mass index (BMI) 27.0-27.9, adult: Secondary | ICD-10-CM | POA: Diagnosis not present

## 2019-09-17 DIAGNOSIS — M351 Other overlap syndromes: Secondary | ICD-10-CM | POA: Diagnosis not present

## 2019-09-17 DIAGNOSIS — I7300 Raynaud's syndrome without gangrene: Secondary | ICD-10-CM | POA: Diagnosis not present

## 2019-09-17 DIAGNOSIS — E663 Overweight: Secondary | ICD-10-CM | POA: Diagnosis not present

## 2019-09-17 DIAGNOSIS — I4891 Unspecified atrial fibrillation: Secondary | ICD-10-CM | POA: Diagnosis not present

## 2019-09-20 DIAGNOSIS — J849 Interstitial pulmonary disease, unspecified: Secondary | ICD-10-CM | POA: Diagnosis not present

## 2019-09-20 DIAGNOSIS — I270 Primary pulmonary hypertension: Secondary | ICD-10-CM | POA: Diagnosis not present

## 2019-09-20 DIAGNOSIS — J9611 Chronic respiratory failure with hypoxia: Secondary | ICD-10-CM | POA: Diagnosis not present

## 2019-09-20 DIAGNOSIS — I272 Pulmonary hypertension, unspecified: Secondary | ICD-10-CM | POA: Diagnosis not present

## 2019-09-27 ENCOUNTER — Ambulatory Visit: Payer: Medicare Other | Admitting: Internal Medicine

## 2019-09-30 ENCOUNTER — Other Ambulatory Visit: Payer: Self-pay

## 2019-10-01 ENCOUNTER — Ambulatory Visit: Payer: Medicare PPO | Admitting: Family

## 2019-10-01 ENCOUNTER — Other Ambulatory Visit: Payer: Self-pay | Admitting: Internal Medicine

## 2019-10-01 ENCOUNTER — Encounter: Payer: Self-pay | Admitting: Family

## 2019-10-01 VITALS — BP 110/72 | Temp 97.1°F | Wt 161.1 lb

## 2019-10-01 DIAGNOSIS — R609 Edema, unspecified: Secondary | ICD-10-CM

## 2019-10-01 DIAGNOSIS — R202 Paresthesia of skin: Secondary | ICD-10-CM | POA: Diagnosis not present

## 2019-10-01 DIAGNOSIS — R300 Dysuria: Secondary | ICD-10-CM | POA: Diagnosis not present

## 2019-10-01 DIAGNOSIS — E559 Vitamin D deficiency, unspecified: Secondary | ICD-10-CM

## 2019-10-01 DIAGNOSIS — E440 Moderate protein-calorie malnutrition: Secondary | ICD-10-CM | POA: Diagnosis not present

## 2019-10-01 DIAGNOSIS — E871 Hypo-osmolality and hyponatremia: Secondary | ICD-10-CM

## 2019-10-01 DIAGNOSIS — K219 Gastro-esophageal reflux disease without esophagitis: Secondary | ICD-10-CM

## 2019-10-01 LAB — CBC WITH DIFFERENTIAL/PLATELET
Basophils Absolute: 0 10*3/uL (ref 0.0–0.1)
Basophils Relative: 1.1 % (ref 0.0–3.0)
Eosinophils Absolute: 0.1 10*3/uL (ref 0.0–0.7)
Eosinophils Relative: 1.8 % (ref 0.0–5.0)
HCT: 40.7 % (ref 36.0–46.0)
Hemoglobin: 13.5 g/dL (ref 12.0–15.0)
Lymphocytes Relative: 18.8 % (ref 12.0–46.0)
Lymphs Abs: 0.8 10*3/uL (ref 0.7–4.0)
MCHC: 33.1 g/dL (ref 30.0–36.0)
MCV: 94.7 fl (ref 78.0–100.0)
Monocytes Absolute: 0.5 10*3/uL (ref 0.1–1.0)
Monocytes Relative: 11.7 % (ref 3.0–12.0)
Neutro Abs: 2.9 10*3/uL (ref 1.4–7.7)
Neutrophils Relative %: 66.6 % (ref 43.0–77.0)
Platelets: 321 10*3/uL (ref 150.0–400.0)
RBC: 4.29 Mil/uL (ref 3.87–5.11)
RDW: 11.8 % (ref 11.5–15.5)
WBC: 4.3 10*3/uL (ref 4.0–10.5)

## 2019-10-01 LAB — COMPREHENSIVE METABOLIC PANEL
ALT: 9 U/L (ref 0–35)
AST: 24 U/L (ref 0–37)
Albumin: 4.2 g/dL (ref 3.5–5.2)
Alkaline Phosphatase: 82 U/L (ref 39–117)
BUN: 15 mg/dL (ref 6–23)
CO2: 33 mEq/L — ABNORMAL HIGH (ref 19–32)
Calcium: 10.3 mg/dL (ref 8.4–10.5)
Chloride: 101 mEq/L (ref 96–112)
Creatinine, Ser: 0.86 mg/dL (ref 0.40–1.20)
GFR: 76.77 mL/min (ref >60.00)
Glucose, Bld: 82 mg/dL (ref 70–99)
Potassium: 4.7 mEq/L (ref 3.5–5.1)
Sodium: 139 mEq/L (ref 135–145)
Total Bilirubin: 0.4 mg/dL (ref 0.2–1.2)
Total Protein: 7.5 g/dL (ref 6.0–8.3)

## 2019-10-01 LAB — LIPID PANEL
Cholesterol: 189 mg/dL (ref 0–200)
HDL: 48.4 mg/dL (ref >39.00)
LDL Cholesterol: 114 mg/dL — ABNORMAL HIGH (ref 0–99)
NonHDL: 140.26
Total CHOL/HDL Ratio: 4
Triglycerides: 130 mg/dL (ref 0.0–149.0)
VLDL: 26 mg/dL (ref 0.0–40.0)

## 2019-10-01 LAB — VITAMIN B12: Vitamin B-12: 599 pg/mL (ref 211–911)

## 2019-10-01 LAB — MAGNESIUM: Magnesium: 2.3 mg/dL (ref 1.5–2.5)

## 2019-10-01 LAB — VITAMIN D 25 HYDROXY (VIT D DEFICIENCY, FRACTURES): VITD: 25.53 ng/mL — ABNORMAL LOW (ref 30.00–100.00)

## 2019-10-02 ENCOUNTER — Encounter: Payer: Self-pay | Admitting: Family

## 2019-10-02 ENCOUNTER — Telehealth: Payer: Self-pay | Admitting: Internal Medicine

## 2019-10-02 NOTE — Telephone Encounter (Signed)
Pt's daughter, [name], stated that they have question regarding her lab results and would like a call back at [phone]   Her question: "The vitamin D, Hyman Hopes wants her to take, will that be something she will prescribe and call in or is that something you can get over the counter?"

## 2019-10-02 NOTE — Progress Notes (Signed)
Established Patient Office Visit  Subjective:  Patient ID: Sabrina Douglas, female    DOB: February 15, 1941  Age: 79 y.o. MRN: [id number]  CC:  Chief Complaint  Patient presents with  . Follow-up  . Leg Swelling    HPI Sabrina Douglas presents for a follow-up of Pulmonary Hypertension, Malnutrition, HTN, and Afib. She has concerns that her legs are swollen, feet cold, and tingling x 3 weeks. She has chronic lower extremity edema bilaterally. She denies any pain with walking. Swelling is worse at the end of the day. She is concerned that her toes are beginning to turn blue and her legs are discolored.   She has received her 1st does of the COVID-19 vaccine and tolerated well.  Past Medical History:  Diagnosis Date  . Anxiety   . Depression   . Hyperlipidemia     Past Surgical History:  Procedure Laterality Date  . BALLOON DILATION N/A 10/04/2018   Procedure: BALLOON DILATION;  Surgeon: Otis Brace, MD;  [facility]: [facility];  Service: Gastroenterology;  Laterality: N/A;  . BIOPSY  10/04/2018   Procedure: BIOPSY;  Surgeon: Otis Brace, MD;  Location: [facility];  Service: Gastroenterology;;  . ESOPHAGOGASTRODUODENOSCOPY (EGD) WITH PROPOFOL N/A 10/04/2018   Procedure: ESOPHAGOGASTRODUODENOSCOPY (EGD) WITH PROPOFOL;  Surgeon: Otis Brace, MD;  [facility];  Service: Gastroenterology;  Laterality: N/A;  . RIGHT HEART CATH N/A 10/15/2018   Procedure: RIGHT HEART CATH;  Surgeon: Jolaine Artist, MD;  Location: [facility];  Service: Cardiovascular;  Laterality: N/A;    Family History  Problem Relation Age of Onset  . Sudden death Mother 86       sudden death post hysterectomy  . Prostate cancer Father   . Diabetes Father     Social History   Socioeconomic History  . Marital status: Widowed    Spouse name: Not on file  . Number of children: Not on file  . Years of education: Not on file  . Highest education level: Not on file   Occupational History  . Not on file  Tobacco Use  . Smoking status: Passive Smoke Exposure - Never Smoker  . Smokeless tobacco: Never Used  Substance and Sexual Activity  . Alcohol use: Never  . Drug use: Not on file  . Sexual activity: Not on file  Other Topics Concern  . Not on file  Social History Narrative  . Not on file   Social Determinants of Health   Financial Resource Strain:   . Difficulty of Paying Living Expenses: Not on file  Food Insecurity:   . Worried About Charity fundraiser in the Last Year: Not on file  . Ran Out of Food in the Last Year: Not on file  Transportation Needs:   . Lack of Transportation (Medical): Not on file  . Lack of Transportation (Non-Medical): Not on file  Physical Activity:   . Days of Exercise per Week: Not on file  . Minutes of Exercise per Session: Not on file  Stress:   . Feeling of Stress : Not on file  Social Connections:   . Frequency of Communication with Friends and Family: Not on file  . Frequency of Social Gatherings with Friends and Family: Not on file  . Attends Religious Services: Not on file  . Active Member of Clubs or Organizations: Not on file  . Attends Archivist Meetings: Not on file  . Marital Status: Not on file  Intimate Partner Violence:   .  Fear of Current or Ex-Partner: Not on file  . Emotionally Abused: Not on file  . Physically Abused: Not on file  . Sexually Abused: Not on file    Outpatient Medications Prior to Visit  Medication Sig Dispense Refill  . albuterol (PROVENTIL HFA;VENTOLIN HFA) 108 (90 Base) MCG/ACT inhaler Inhale 2 puffs into the lungs every 6 (six) hours as needed for wheezing or shortness of breath. 1 Inhaler 6  . ALPRAZolam (XANAX) 0.25 MG tablet Take 0.125-0.25 mg by mouth 2 (two) times daily as needed for anxiety.     Marland Kitchen apixaban (ELIQUIS) 5 MG TABS tablet TAKE 1 TABLET(5 MG) BY MOUTH TWICE DAILY 60 tablet 3  . furosemide (LASIX) 40 MG tablet TAKE 1 TABLET BY MOUTH DAILY  90 tablet 1  . Glycerin-Hypromellose-PEG 400 ([facility] DRY EYE RELIEF) 0.2-0.2-1 % SOLN Place 2 drops into both eyes as needed (Dry eyes).    . hydroxychloroquine (PLAQUENIL) 200 MG tablet Take 1 tablet by mouth 2 (two) times a day.    . metoprolol tartrate (LOPRESSOR) 25 MG tablet Take 0.5 tablets (12.5 mg total) by mouth 2 (two) times daily. 180 tablet 1  . Multiple Vitamin (MULTIVITAMIN WITH MINERALS) TABS tablet Take 1 tablet by mouth daily.    Marland Kitchen nystatin (MYCOSTATIN/NYSTOP) powder Apply topically 4 (four) times daily. 15 g 1  . predniSONE (DELTASONE) 5 MG tablet Take 5 mg by mouth every other day.     Marland Kitchen Respiratory Therapy Supplies (FLUTTER) DEVI 1 Device by Does not apply route as needed. 1 each 0  . pantoprazole (PROTONIX) 20 MG tablet TK 1 T PO QAM 90 tablet 1   No facility-administered medications prior to visit.    No Known Allergies  ROS Review of Systems    Objective:    Physical Exam  BP 110/72   Temp (!) 97.1 F (36.2 C) (Temporal)   Wt 161 lb 1.6 oz (73.1 kg)   BMI 27.65 kg/m  Wt Readings from Last 3 Encounters:  10/01/19 161 lb 1.6 oz (73.1 kg)  06/27/19 155 lb 1.6 oz (70.4 kg)  06/18/19 155 lb (70.3 kg)     Health Maintenance Due  Topic Date Due  . TETANUS/TDAP  06/18/1958    There are no preventive care reminders to display for this patient.  Lab Results  Component Value Date   TSH 2.154 09/29/2018   Lab Results  Component Value Date   WBC 4.3 10/01/2019   HGB 13.5 10/01/2019   HCT 40.7 10/01/2019   MCV 94.7 10/01/2019   PLT 321.0 10/01/2019   Lab Results  Component Value Date   NA 139 10/01/2019   K 4.7 10/01/2019   CO2 33 (H) 10/01/2019   GLUCOSE 82 10/01/2019   BUN 15 10/01/2019   CREATININE 0.86 10/01/2019   BILITOT 0.4 10/01/2019   ALKPHOS 82 10/01/2019   AST 24 10/01/2019   ALT 9 10/01/2019   PROT 7.5 10/01/2019   ALBUMIN 4.2 10/01/2019   CALCIUM 10.3 10/01/2019   ANIONGAP 10 10/24/2018   GFR 76.77 10/01/2019   Lab Results   Component Value Date   CHOL 189 10/01/2019   Lab Results  Component Value Date   HDL 48.40 10/01/2019   Lab Results  Component Value Date   LDLCALC 114 (H) 10/01/2019   Lab Results  Component Value Date   TRIG 130.0 10/01/2019   Lab Results  Component Value Date   CHOLHDL 4 10/01/2019   No results found for: HGBA1C  Assessment & Plan:   Problem List Items Addressed This Visit    Malnutrition of moderate degree   Relevant Orders   Magnesium (Completed)   CBC with Differential/Platelets (Completed)   Vitamin D, 25-hydroxy (Completed)   Hyponatremia   Relevant Orders   Magnesium (Completed)   CBC with Differential/Platelets (Completed)   Vitamin D, 25-hydroxy (Completed)    Other Visit Diagnoses    Edema, peripheral    -  Primary   Relevant Orders   CBC with Differential/Platelets (Completed)   Vitamin D, 25-hydroxy (Completed)   VAS [place] LE ART SEG MULTI (Segm&LE Reynauds)   Vitamin D deficiency       Relevant Orders   CBC with Differential/Platelets (Completed)   Vitamin D, 25-hydroxy (Completed)   Tingling in extremities       Relevant Orders   Vitamin B12 (Completed)   CBC with Differential/Platelets (Completed)   Vitamin D, 25-hydroxy (Completed)   VAS [place] LE ART SEG MULTI (Segm&LE Reynauds)   Dysuria       Relevant Orders   CBC with Differential/Platelets (Completed)   Vitamin D, 25-hydroxy (Completed)      Sabrina Douglas was seen today for follow-up and leg swelling.  Diagnoses and all orders for this visit:  Edema, peripheral -     Cancel: Basic Metabolic Panel; Future -     CBC with Differential/Platelets -     Cancel: Vitamin D, 25-hydroxy; Future -     Vitamin D, 25-hydroxy; Future -     Vitamin D, 25-hydroxy -     VAS [place] LE ART SEG MULTI (Segm&LE Reynauds); Future  Vitamin D deficiency -     Cancel: Vitamin D, 1,25-dihydroxy; Future -     Cancel: Vitamin D, 1,25-dihydroxy -     CBC with Differential/Platelets -     Cancel: Vitamin D,  25-hydroxy; Future -     Vitamin D, 25-hydroxy; Future -     Vitamin D, 25-hydroxy  Malnutrition of moderate degree -     Cancel: Basic Metabolic Panel; Future -     Magnesium; Future -     CMP -     Magnesium -     Lipid panel -     CBC with Differential/Platelets -     Cancel: Vitamin D, 25-hydroxy; Future -     Vitamin D, 25-hydroxy; Future -     Vitamin D, 25-hydroxy  Hyponatremia -     Cancel: Basic Metabolic Panel; Future -     Magnesium; Future -     CMP -     Magnesium -     Lipid panel -     CBC with Differential/Platelets -     Cancel: Vitamin D, 25-hydroxy; Future -     Vitamin D, 25-hydroxy; Future -     Vitamin D, 25-hydroxy  Tingling in extremities -     Vitamin B12; Future -     Vitamin B12 -     CBC with Differential/Platelets -     Cancel: Vitamin D, 25-hydroxy; Future -     Vitamin D, 25-hydroxy; Future -     Vitamin D, 25-hydroxy -     VAS [place] LE ART SEG MULTI (Segm&LE Reynauds); Future  Dysuria -     CMP -     Lipid panel -     CBC with Differential/Platelets -     Cancel: Vitamin D, 25-hydroxy; Future -     Vitamin D, 25-hydroxy; Future -     Vitamin  D, 25-hydroxy    Follow-up: Pending doppler study, 3 months and sooner as needed.   Eulis Foster, FNP

## 2019-10-02 NOTE — Patient Instructions (Signed)
Edema  Edema is when you have too much fluid in your body or under your skin. Edema may make your legs, feet, and ankles swell up. Swelling is also common in looser tissues, like around your eyes. This is a common condition. It gets more common as you get older. There are many possible causes of edema. Eating too much salt (sodium) and being on your feet or sitting for a long time can cause edema in your legs, feet, and ankles. Hot weather may make edema worse. Edema is usually painless. Your skin may look swollen or shiny. Follow these instructions at home:  Keep the swollen body part raised (elevated) above the level of your heart when you are sitting or lying down.  Do not sit still or stand for a long time.  Do not wear tight clothes. Do not wear garters on your upper legs.  Exercise your legs. This can help the swelling go down.  Wear elastic bandages or support stockings as told by your doctor.  Eat a low-salt (low-sodium) diet to reduce fluid as told by your doctor.  Depending on the cause of your swelling, you may need to limit how much fluid you drink (fluid restriction).  Take over-the-counter and prescription medicines only as told by your doctor. Contact a doctor if:  Treatment is not working.  You have heart, liver, or kidney disease and have symptoms of edema.  You have sudden and unexplained weight gain. Get help right away if:  You have shortness of breath or chest pain.  You cannot breathe when you lie down.  You have pain, redness, or warmth in the swollen areas.  You have heart, liver, or kidney disease and get edema all of a sudden.  You have a fever and your symptoms get worse all of a sudden. Summary  Edema is when you have too much fluid in your body or under your skin.  Edema may make your legs, feet, and ankles swell up. Swelling is also common in looser tissues, like around your eyes.  Raise (elevate) the swollen body part above the level of your  heart when you are sitting or lying down.  Follow your doctor's instructions about diet and how much fluid you can drink (fluid restriction). This information is not intended to replace advice given to you by your health care provider. Make sure you discuss any questions you have with your health care provider. Document Revised: 07/28/2017 Document Reviewed: 08/12/2016 Elsevier Patient Education  2020 [employer]  

## 2019-10-03 ENCOUNTER — Other Ambulatory Visit: Payer: Self-pay | Admitting: Family

## 2019-10-03 DIAGNOSIS — E559 Vitamin D deficiency, unspecified: Secondary | ICD-10-CM

## 2019-10-03 NOTE — Telephone Encounter (Signed)
Spoke with daughter and review lab results.  See lab result note.

## 2019-10-08 ENCOUNTER — Telehealth: Payer: Self-pay | Admitting: Internal Medicine

## 2019-10-08 DIAGNOSIS — R609 Edema, unspecified: Secondary | ICD-10-CM

## 2019-10-08 DIAGNOSIS — R600 Localized edema: Secondary | ICD-10-CM

## 2019-10-08 NOTE — Telephone Encounter (Signed)
Is this the order you would like?

## 2019-10-08 NOTE — Telephone Encounter (Signed)
Sabrina Douglas called in reference to a scan that was supposed to have done on the patients foot. She hasn't received a call to have this scan done. I did not see any order for a scan.   Please advise

## 2019-10-09 ENCOUNTER — Encounter (HOSPITAL_COMMUNITY): Payer: Medicare PPO

## 2019-10-14 ENCOUNTER — Other Ambulatory Visit: Payer: Self-pay

## 2019-10-14 ENCOUNTER — Ambulatory Visit (HOSPITAL_COMMUNITY)
Admission: RE | Admit: 2019-10-14 | Discharge: 2019-10-14 | Disposition: A | Discharge status: Home / Self Care | Payer: Medicare PPO | Source: Ambulatory Visit | Attending: Cardiology | Admitting: Cardiology

## 2019-10-14 DIAGNOSIS — R609 Edema, unspecified: Secondary | ICD-10-CM

## 2019-10-14 DIAGNOSIS — R600 Localized edema: Secondary | ICD-10-CM | POA: Diagnosis not present

## 2019-10-18 DIAGNOSIS — J849 Interstitial pulmonary disease, unspecified: Secondary | ICD-10-CM | POA: Diagnosis not present

## 2019-10-18 DIAGNOSIS — I272 Pulmonary hypertension, unspecified: Secondary | ICD-10-CM | POA: Diagnosis not present

## 2019-11-11 ENCOUNTER — Other Ambulatory Visit: Payer: Self-pay | Admitting: *Deleted

## 2019-11-11 ENCOUNTER — Other Ambulatory Visit: Payer: Self-pay | Admitting: Internal Medicine

## 2019-11-11 MED ORDER — METOPROLOL TARTRATE 25 MG PO TABS: 25.0000 mg | ORAL_TABLET | Freq: Two times a day (BID) | ORAL | 0 refills | Status: DC

## 2019-11-11 MED ORDER — METOPROLOL TARTRATE 25 MG PO TABS: 12.5000 mg | ORAL_TABLET | Freq: Two times a day (BID) | ORAL | 0 refills | Status: DC

## 2019-11-11 NOTE — Telephone Encounter (Signed)
Rx refilled until OV 12/31/2019

## 2019-11-11 NOTE — Telephone Encounter (Signed)
metoprolol tartrate (LOPRESSOR) 25 MG tablet    Patient needs today.  [id number] [place], [place] - [phone] AT [facility] OF [facility] & [address] Phone:  [phone]  Fax:  [phone]

## 2019-11-11 NOTE — Telephone Encounter (Signed)
Rx done. 

## 2019-11-18 DIAGNOSIS — I272 Pulmonary hypertension, unspecified: Secondary | ICD-10-CM | POA: Diagnosis not present

## 2019-11-18 DIAGNOSIS — J849 Interstitial pulmonary disease, unspecified: Secondary | ICD-10-CM | POA: Diagnosis not present

## 2019-12-12 ENCOUNTER — Encounter (HOSPITAL_COMMUNITY): Payer: Self-pay | Admitting: Internal Medicine

## 2019-12-12 ENCOUNTER — Ambulatory Visit (HOSPITAL_COMMUNITY)
Admission: RE | Admit: 2019-12-12 | Discharge: 2019-12-12 | Disposition: A | Discharge status: Home / Self Care | Payer: Medicare PPO | Source: Ambulatory Visit | Attending: Internal Medicine | Admitting: Internal Medicine

## 2019-12-12 ENCOUNTER — Other Ambulatory Visit: Payer: Self-pay

## 2019-12-12 VITALS — BP 128/84 | HR 76 | Wt 159.4 lb

## 2019-12-12 DIAGNOSIS — I472 Ventricular tachycardia: Secondary | ICD-10-CM | POA: Diagnosis not present

## 2019-12-12 DIAGNOSIS — J479 Bronchiectasis, uncomplicated: Secondary | ICD-10-CM | POA: Diagnosis not present

## 2019-12-12 DIAGNOSIS — I509 Heart failure, unspecified: Secondary | ICD-10-CM | POA: Insufficient documentation

## 2019-12-12 DIAGNOSIS — Z79899 Other long term (current) drug therapy: Secondary | ICD-10-CM | POA: Diagnosis not present

## 2019-12-12 DIAGNOSIS — J8410 Pulmonary fibrosis, unspecified: Secondary | ICD-10-CM | POA: Insufficient documentation

## 2019-12-12 DIAGNOSIS — J9611 Chronic respiratory failure with hypoxia: Secondary | ICD-10-CM | POA: Diagnosis not present

## 2019-12-12 DIAGNOSIS — I10 Essential (primary) hypertension: Secondary | ICD-10-CM | POA: Insufficient documentation

## 2019-12-12 DIAGNOSIS — E785 Hyperlipidemia, unspecified: Secondary | ICD-10-CM | POA: Insufficient documentation

## 2019-12-12 DIAGNOSIS — I480 Paroxysmal atrial fibrillation: Secondary | ICD-10-CM | POA: Insufficient documentation

## 2019-12-12 DIAGNOSIS — R079 Chest pain, unspecified: Secondary | ICD-10-CM | POA: Diagnosis not present

## 2019-12-12 DIAGNOSIS — M351 Other overlap syndromes: Secondary | ICD-10-CM | POA: Insufficient documentation

## 2019-12-12 DIAGNOSIS — R1310 Dysphagia, unspecified: Secondary | ICD-10-CM | POA: Diagnosis not present

## 2019-12-12 DIAGNOSIS — K219 Gastro-esophageal reflux disease without esophagitis: Secondary | ICD-10-CM | POA: Diagnosis not present

## 2019-12-12 DIAGNOSIS — M349 Systemic sclerosis, unspecified: Secondary | ICD-10-CM | POA: Insufficient documentation

## 2019-12-12 DIAGNOSIS — Z7901 Long term (current) use of anticoagulants: Secondary | ICD-10-CM | POA: Insufficient documentation

## 2019-12-12 DIAGNOSIS — M329 Systemic lupus erythematosus, unspecified: Secondary | ICD-10-CM | POA: Diagnosis not present

## 2019-12-12 DIAGNOSIS — K209 Esophagitis, unspecified without bleeding: Secondary | ICD-10-CM | POA: Diagnosis not present

## 2019-12-12 DIAGNOSIS — I471 Supraventricular tachycardia: Secondary | ICD-10-CM | POA: Insufficient documentation

## 2019-12-12 LAB — BASIC METABOLIC PANEL
Anion gap: 9 (ref 5–15)
BUN: 12 mg/dL (ref 8–23)
CO2: 28 mmol/L (ref 22–32)
Calcium: 9.6 mg/dL (ref 8.9–10.3)
Chloride: 102 mmol/L (ref 98–111)
Creatinine, Ser: 0.85 mg/dL (ref 0.44–1.00)
GFR calc Af Amer: >60 mL/min (ref >60)
GFR calc non Af Amer: >60 mL/min (ref >60)
Glucose, Bld: 112 mg/dL — ABNORMAL HIGH (ref 70–99)
Potassium: 4.3 mmol/L (ref 3.5–5.1)
Sodium: 139 mmol/L (ref 135–145)

## 2019-12-12 MED ORDER — PANTOPRAZOLE SODIUM 40 MG PO TBEC: DELAYED_RELEASE_TABLET | ORAL | 6 refills | Status: DC

## 2019-12-12 NOTE — Progress Notes (Addendum)
ADVANCED HF CLINIC NOTE  Date:  12/12/2019   ID:  Sabrina, Douglas [birth year], MRN [id number]  Location: Home  Provider location: [facility] Type of Visit: Established patient  PCP:  Sabrina Douglas, Sabrina Halsted, MD  Cardiologist:  No primary care provider on file. Primary HF: Sabrina Douglas  Chief Complaint: Heart Failure follow-up   History of Present Illness:  HPI: Sabrina Douglas is a 79 y.o. female with h/ohypertension, hyperlipidemia, anxiety/depression, and ILD due MCTD.   Patient seen for initial consult with Dr. Elsworth Douglas on 08/28/18. CXR showed mildly worsened bibasilar pneumonia, suggestive of atypical infection d/t waxing/weaning symptoms. 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 Ordered for HRCT, concern for ILD. RX short course prednisone. ANA positive, CCP negative. 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  Admitted 2/26-2/28/20 with severe weakness. Fount to havehyponatremia at114,dysphagia and about 20 pounds weight loss in the months. Treated with 3% saline and eventually demeclocycline. Prior to d/c had VQ and echo as above. Tested for need for home O2 and was told she didn't need it.   Pt admitted 3/10 - 10/17/18 with new onset Afib. Converted back to NSR with amiodarone drip and was discontinued. Toprol resumed for home and Holter monitor ordered. Eliquis started.  She returns for HF follow up. Here with her daughter. Follows with Dr. Elsworth Douglas & Sabrina Douglas. 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 Was on plaquenil/prednisone. Now on prednisone 56m qod. Hires CT improving.  Says overall doing ok but has multiple non-specific complaints. Main complaint is that she has epigastric pain which can radiate into her chest. Still with burping. And some dysphagia. Had EGD 3/20 with esophagitis and a benign stricture that was dilated. No edema, orthopnea or PND. + cough. CT chest in  3/20 no coronary calcification. No palpitations. No bleeding on Eliquis.     HiRes CT 10/20 1. Bilateral lower lobe predominant bronchiectasis and volume loss may be postinfectious or post inflammatory in etiology. Findings are suggestive of an alternative diagnosis (not UIP) per consensus guidelines: Diagnosis of Idiopathic Pulmonary Fibrosis   Zio Patch 11/28/18  1. Predominant rhythm was sinus. Patient had a min HR of 54 bpm, max HR of 214 bpm, and avg HR of 96 bpm. 2. Two brief runs of NSVT - longest was 8 beats 3. Forty five brief runs of SVT. The run with the fastest interval lasting 5 beats with a max rate of 214 bpm, the longest lasting 15.7 secs with an avg rate of 154 bpm. 4. Rare PVCs   RHC 10/15/18 RA = 3 RV = 39/4 PA = 39/7 (18) PCW = 4 Fick cardiac output/index = 6.5/3.8 PVR = 2.1 WU Ao sat = 100% PA sat = 81%, 82% High SVC and axillary sats both 81%  Assessment: 1. Normal pulmonary pressures 2. High output physiology without evidence of intracardiac shunting   Labs  ANA1:1280(pos) ESR 104  RF 55(pos) RNP > 8.0(pos) AntiJo negative CCP < 16 Anti-Smith 2.0 (pos) Scl-70 < 1.0 ANCA negative  HRCT 09/10/18 1. 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 Repeat high-resolution chest CT is recommended in 12 months to assess for temporal changes in the appearance of the lung parenchyma. 2. Aortic atherosclerosis.  Echo 09/30/18 1. The left ventricle has hyperdynamic systolic function, with an ejection fraction of >65%. The cavity size was  normal. Left ventricular diastolic parameters were normal No evidence of left ventricular regional wall motion abnormalities. 2. No evidence of left ventricular regional wall motion abnormalities. 3. The right ventricle has normal systolic function. The cavity was grossly normal. RA-RV peak gradient increased  at 66.9 mmHg consistent with probable severe pulmonary hypertension. Could not estimate CVP however. 4. The aortic valve is tricuspid Mild aortic annular calcification noted. 5. The mitral valve is normal in structure. 6. The tricuspid valve is normal in structure. Tricuspid valve regurgitation is  PFTs (10/02/18) FEV1 1.13 (66%) FVC 1.23 (56%) DLCO 16%  VQ scan 10/03/18: Negative    Anisa Leanos denies symptoms worrisome for COVID 19.   Past Medical History:  Diagnosis Date  . Anxiety   . Depression   . Hyperlipidemia    Past Surgical History:  Procedure Laterality Date  . BALLOON DILATION N/A 10/04/2018   Procedure: BALLOON DILATION;  Surgeon: Sabrina Brace, MD;  Location: [facility];  Service: Gastroenterology;  Laterality: N/A;  . BIOPSY  10/04/2018   Procedure: BIOPSY;  Surgeon: Sabrina Brace, MD;  Location: [facility];  Service: Gastroenterology;;  . ESOPHAGOGASTRODUODENOSCOPY (EGD) WITH PROPOFOL N/A 10/04/2018   Procedure: ESOPHAGOGASTRODUODENOSCOPY (EGD) WITH PROPOFOL;  Surgeon: Sabrina Brace, MD;  Location: [facility];  Service: Gastroenterology;  Laterality: N/A;  . RIGHT HEART CATH N/A 10/15/2018   Procedure: RIGHT HEART CATH;  Surgeon: Sabrina Artist, MD;  Location: [facility];  Service: Cardiovascular;  Laterality: N/A;     Current Outpatient Medications  Medication Sig Dispense Refill  . albuterol (PROVENTIL HFA;VENTOLIN HFA) 108 (90 Base) MCG/ACT inhaler Inhale 2 puffs into the lungs every 6 (six) hours as needed for wheezing or shortness of breath. 1 Inhaler 6  . apixaban (ELIQUIS) 5 MG TABS tablet TAKE 1 TABLET(5 MG) BY MOUTH TWICE DAILY 60 tablet 3  . furosemide (LASIX) 40 MG tablet TAKE 1 TABLET BY MOUTH DAILY 90 tablet 1  . Glycerin-Hypromellose-PEG 400 ([employer] DRY EYE RELIEF) 0.2-0.2-1 % SOLN Place 2 drops into both eyes as needed (Dry eyes).    . hydroxychloroquine (PLAQUENIL) 200 MG tablet Take 1 tablet by mouth 2  (two) times a day.    . metoprolol tartrate (LOPRESSOR) 25 MG tablet TAKE 1/2 TABLET(12.5 MG) BY MOUTH TWICE DAILY 90 tablet 1  . Multiple Vitamin (MULTIVITAMIN WITH MINERALS) TABS tablet Take 1 tablet by mouth daily.    Marland Kitchen nystatin (MYCOSTATIN/NYSTOP) powder Apply topically 4 (four) times daily. 15 g 1  . pantoprazole (PROTONIX) 20 MG tablet TAKE 1 TABLET BY MOUTH EVERY MORNING 90 tablet 1  . predniSONE (DELTASONE) 5 MG tablet Take 5 mg by mouth every other day.     Marland Kitchen Respiratory Therapy Supplies (FLUTTER) DEVI 1 Device by Does not apply route as needed. 1 each 0   No current facility-administered medications for this encounter.    Allergies:   Patient has no known allergies.   Social History:  The patient  reports that she is a non-smoker but has been exposed to tobacco smoke. She has never used smokeless tobacco. She reports that she does not drink alcohol.   Family History:  The patient's family history includes Diabetes in her father; Prostate cancer in her father; Sudden death (age of onset: 57) in her mother.   ROS:  Please see the history of present illness.   All other systems are personally reviewed and negative.   Vitals:   12/12/19 1143  BP: 128/84  Pulse: 76  SpO2: 98%  Weight: 72.3 kg (159 lb 6.4 oz)     Exam:   General:  Well appearing. No resp difficulty HEENT: normal Neck: supple. no JVD. Carotids 2+ bilat; no bruits. No lymphadenopathy or thryomegaly appreciated. Cor: PMI nondisplaced. Regular rate & rhythm. No rubs, gallops or murmurs. Lungs: minimal fine crackles Abdomen: soft, nontender, nondistended. No hepatosplenomegaly. No bruits or masses. Good bowel sounds. Extremities: no cyanosis, clubbing, rash, edema Neuro: alert & orientedx3, cranial nerves grossly intact. moves all 4 extremities w/o difficulty. Affect pleasant   Recent Labs: 10/01/2019: ALT 9; BUN 15; Creatinine, Ser 0.86; Hemoglobin 13.5; Magnesium 2.3; Platelets 321.0; Potassium 4.7; Sodium 139   Personally reviewed   Wt Readings from Last 3 Encounters:  12/12/19 72.3 kg (159 lb 6.4 oz)  10/01/19 73.1 kg (161 lb 1.6 oz)  06/27/19 70.4 kg (155 lb 1.6 oz)     ASSESSMENT AND PLAN:  1. Epigastric and chest pain with radiation to left arm - typical and atypical features.  - With h/o scleroderma and previous esophageal stricture/esophagitis suspect may be GI but has multiple CRFs - previous chest CT reviewed and no coronary calcifications - we discussed options for more definitive eval and will proceed with coronary CTA - refer back to [facility] - Continue PPI  2. 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 Elsworth Douglas in Pulmonary - Much improved with steroids. - VQ is negative. - RHC 10/15/18 with normal pulmonary pressures and high output physiology without evidence of intracardiac shunting so not candidate for selective pulmonary vasodilators  - Volume status looks good. Continue lasix  3. Chronic hypoxic respiratory failure - likely due to ILD & less PAH - Much improved with treatment of MCTD - Followed closely by Dr Sabrina Douglas.  - continue current regimen  2. Afib, paroxysmal - In NSR today - Continue Eliquis 5 mg BID. Continue 5 bid If weigh < 60 kg or cr >= 1.5 will decrease dose.  - This patients CHA2DS2-VASc is 4.   4. Palpitations - ZioPatch with brief runs NSVT/SVT - Continue lopressor 12.5 mg twice a day. Can take extra if having palpitations.  - No change  Signed, Glori Bickers, MD  12/12/2019 12:11 PM  [facility] [place] [address] [facility] and [place] [address] [phone] (office) [phone] (fax)

## 2019-12-12 NOTE — Patient Instructions (Signed)
Increase Pantoprozole to 40 mg daily  Your physician has requested that you have cardiac CT. Cardiac computed tomography (CT) is a painless test that uses an x-ray machine to take clear, detailed pictures of your heart. For further information please visit [email or web address] Please follow instruction sheet as given.  ONCE THIS IS APPROVED WITH YOUR INSURANCE COMPANY YOU WILL BE CONTACTED TO SCHEDULE  Please follow up with [facility]  Please call our office in 1 year to schedule your follow up appintment  If you have any questions or concerns before your next appointment please send [place] a message through [place] or call our office at [phone].

## 2019-12-18 DIAGNOSIS — Z6827 Body mass index (BMI) 27.0-27.9, adult: Secondary | ICD-10-CM | POA: Diagnosis not present

## 2019-12-18 DIAGNOSIS — I7300 Raynaud's syndrome without gangrene: Secondary | ICD-10-CM | POA: Diagnosis not present

## 2019-12-18 DIAGNOSIS — E663 Overweight: Secondary | ICD-10-CM | POA: Diagnosis not present

## 2019-12-18 DIAGNOSIS — J849 Interstitial pulmonary disease, unspecified: Secondary | ICD-10-CM | POA: Diagnosis not present

## 2019-12-18 DIAGNOSIS — I4891 Unspecified atrial fibrillation: Secondary | ICD-10-CM | POA: Diagnosis not present

## 2019-12-18 DIAGNOSIS — M351 Other overlap syndromes: Secondary | ICD-10-CM | POA: Diagnosis not present

## 2019-12-18 DIAGNOSIS — I272 Pulmonary hypertension, unspecified: Secondary | ICD-10-CM | POA: Diagnosis not present

## 2019-12-19 ENCOUNTER — Other Ambulatory Visit (HOSPITAL_COMMUNITY): Payer: Self-pay

## 2019-12-19 MED ORDER — APIXABAN 5 MG PO TABS: ORAL_TABLET | ORAL | 11 refills | Status: DC

## 2019-12-23 ENCOUNTER — Other Ambulatory Visit: Payer: Self-pay

## 2019-12-23 ENCOUNTER — Other Ambulatory Visit: Payer: Self-pay | Admitting: Internal Medicine

## 2019-12-23 ENCOUNTER — Encounter: Payer: Self-pay | Admitting: Pulmonary Disease

## 2019-12-23 ENCOUNTER — Ambulatory Visit: Payer: Medicare PPO | Admitting: Pulmonary Disease

## 2019-12-23 DIAGNOSIS — J9611 Chronic respiratory failure with hypoxia: Secondary | ICD-10-CM | POA: Diagnosis not present

## 2019-12-23 DIAGNOSIS — J849 Interstitial pulmonary disease, unspecified: Secondary | ICD-10-CM

## 2019-12-23 NOTE — Assessment & Plan Note (Signed)
Much improved since initial onset Okay to taper prednisone to off Okay to stop using prednisone starting July 1  She will continue on hydroxychloroquine -no other systemic symptoms, mild cough persists Schedule PFTs in September and office visit after

## 2019-12-23 NOTE — Patient Instructions (Signed)
  Okay to stop using prednisone starting July 1 Schedule PFTs in September and office visit after  Okay to keep using oxygen only on an as-needed basis

## 2019-12-23 NOTE — Assessment & Plan Note (Signed)
Much improved, she would like to hold onto her oxygen and will continue using this on an as-needed basis

## 2019-12-23 NOTE — Progress Notes (Signed)
   Subjective:    Patient ID: Sabrina Douglas, female    DOB: 1940/09/20, 79 y.o.   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   Sees Dr. Dierdre Forth -initially started on Plaquenil and 10 mg of prednisone, gradually tapered and now after her last visit 2 weeks ago is down to 2.5 mg every other day  She complains of a chronic mild cough with occasional mucus production, she complained of chest pain to Dr. Gala Romney and he is ordered a cardiac CT. She does report significant anxiety and continues to use oxygen even though oximetry repeatedly has shown normal oxygen levels.  She is compliant with oxygen during sleep  Accompanied by her daughter who independently confirms history Is received Covid vaccines  Significant tests/ events reviewed ANA positive1: 1280,Sm Ab 2.0, RNP > 8.0 ,CCP negative 10/02/2018 PFTs ratio 92, moderate to severe restriction FEV1 66%, FVC 56%, TLC 46%, DLCO 17% corrects to 51% for alveolar volume  Echo 2/2020RVSP 67 Cath 3/2020RA 3, PA 39/7 , PVR2.1 Wu  HRCT 05/2019 Bilateral lower lobe predominant bronchiectasis and volume loss may be postinfectious or post inflammatory in etiology 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 Benign stricture   Review of Systems Patient denies significant dyspnea,cough, hemoptysis,  chest pain, palpitations, pedal edema, orthopnea, paroxysmal nocturnal dyspnea, lightheadedness, nausea, vomiting, abdominal or  leg pains      Objective:   Physical Exam   Gen. Pleasant, well-nourished, in no distress ENT - no thrush, no pallor/icterus,no post nasal drip Neck: No JVD, no thyromegaly, no carotid bruits Lungs: no use of accessory muscles, no dullness to percussion, clear without rales  or rhonchi  Cardiovascular: Rhythm regular, heart sounds  normal, no murmurs or gallops, no peripheral edema Musculoskeletal: No deformities, no cyanosis or clubbing         Assessment & Plan:

## 2019-12-30 ENCOUNTER — Other Ambulatory Visit: Payer: Self-pay

## 2019-12-31 ENCOUNTER — Encounter: Payer: Self-pay | Admitting: Internal Medicine

## 2019-12-31 ENCOUNTER — Ambulatory Visit: Payer: Medicare PPO | Admitting: Internal Medicine

## 2019-12-31 VITALS — BP 120/80 | HR 74 | Temp 97.2°F | Ht 64.0 in | Wt 158.0 lb

## 2019-12-31 DIAGNOSIS — E871 Hypo-osmolality and hyponatremia: Secondary | ICD-10-CM | POA: Diagnosis not present

## 2019-12-31 DIAGNOSIS — I272 Pulmonary hypertension, unspecified: Secondary | ICD-10-CM | POA: Diagnosis not present

## 2019-12-31 DIAGNOSIS — R1310 Dysphagia, unspecified: Secondary | ICD-10-CM | POA: Diagnosis not present

## 2019-12-31 DIAGNOSIS — R1319 Other dysphagia: Secondary | ICD-10-CM

## 2019-12-31 DIAGNOSIS — J9611 Chronic respiratory failure with hypoxia: Secondary | ICD-10-CM | POA: Diagnosis not present

## 2019-12-31 DIAGNOSIS — I10 Essential (primary) hypertension: Secondary | ICD-10-CM

## 2019-12-31 DIAGNOSIS — Z23 Encounter for immunization: Secondary | ICD-10-CM

## 2019-12-31 DIAGNOSIS — J849 Interstitial pulmonary disease, unspecified: Secondary | ICD-10-CM

## 2019-12-31 DIAGNOSIS — E559 Vitamin D deficiency, unspecified: Secondary | ICD-10-CM | POA: Diagnosis not present

## 2019-12-31 LAB — BASIC METABOLIC PANEL
BUN: 10 mg/dL (ref 6–23)
CO2: 32 mEq/L (ref 19–32)
Calcium: 10.3 mg/dL (ref 8.4–10.5)
Chloride: 100 mEq/L (ref 96–112)
Creatinine, Ser: 0.84 mg/dL (ref 0.40–1.20)
GFR: 78.83 mL/min (ref >60.00)
Glucose, Bld: 96 mg/dL (ref 70–99)
Potassium: 4.7 mEq/L (ref 3.5–5.1)
Sodium: 140 mEq/L (ref 135–145)

## 2019-12-31 LAB — VITAMIN D 25 HYDROXY (VIT D DEFICIENCY, FRACTURES): VITD: 33.82 ng/mL (ref 30.00–100.00)

## 2019-12-31 LAB — MAGNESIUM: Magnesium: 2.3 mg/dL (ref 1.5–2.5)

## 2019-12-31 NOTE — Progress Notes (Signed)
Established Patient Office Visit     This visit occurred during the SARS-CoV-2 public health emergency.  Safety protocols were in place, including screening questions prior to the visit, additional usage of staff PPE, and extensive cleaning of exam room while observing appropriate contact time as indicated for disinfecting solutions.    CC/Reason for Visit: 84-month follow-up chronic conditions  HPI: Sabrina Douglas is a 79 y.o. female who is coming in today for the above mentioned reasons. Past Medical History is significant for: Hypertension that has been well controlled, well-controlled hyperlipidemia, history of atrial fibrillation that has been rate controlled and followed by Dr. Haroldine Laws.  She also has a history of interstitial lung disease and pulmonary hypertension followed by Dr. [name] and Dr. [name] with rheumatology.    Her prednisone has been titrated down to 2.5 mg every other day.  She is due for Prevnar, Tdap and shingles.  She has completed her Covid vaccines.  She is again complaining of food getting stuck in her esophagus, has follow-up with GI this week, she had an esophageal dilatation last year.   Past Medical/Surgical History: Past Medical History:  Diagnosis Date  . Anxiety   . Depression   . Hyperlipidemia     Past Surgical History:  Procedure Laterality Date  . BALLOON DILATION N/A 10/04/2018   Procedure: BALLOON DILATION;  Surgeon: Otis Brace, MD;  Location: [facility];  Service: Gastroenterology;  Laterality: N/A;  . BIOPSY  10/04/2018   Procedure: BIOPSY;  Surgeon: Otis Brace, MD;  Location: [facility];  Service: Gastroenterology;;  . ESOPHAGOGASTRODUODENOSCOPY (EGD) WITH PROPOFOL N/A 10/04/2018   Procedure: ESOPHAGOGASTRODUODENOSCOPY (EGD) WITH PROPOFOL;  Surgeon: Otis Brace, MD;  Location: [facility];  Service: Gastroenterology;  Laterality: N/A;  . RIGHT HEART CATH N/A 10/15/2018   Procedure: RIGHT HEART CATH;  Surgeon:  Jolaine Artist, MD;  Location: [facility];  Service: Cardiovascular;  Laterality: N/A;    Social History:  reports that she is a non-smoker but has been exposed to tobacco smoke. She has never used smokeless tobacco. She reports that she does not drink alcohol. No history on file for drug.  Allergies: No Known Allergies  Family History:  Family History  Problem Relation Age of Onset  . Sudden death Mother 100       sudden death post hysterectomy  . Prostate cancer Father   . Diabetes Father      Current Outpatient Medications:  .  albuterol (PROVENTIL HFA;VENTOLIN HFA) 108 (90 Base) MCG/ACT inhaler, Inhale 2 puffs into the lungs every 6 (six) hours as needed for wheezing or shortness of breath., Disp: 1 Inhaler, Rfl: 6 .  apixaban (ELIQUIS) 5 MG TABS tablet, TAKE 1 TABLET(5 MG) BY MOUTH TWICE DAILY, Disp: 60 tablet, Rfl: 11 .  furosemide (LASIX) 40 MG tablet, TAKE 1 TABLET BY MOUTH DAILY, Disp: 90 tablet, Rfl: 1 .  Glycerin-Hypromellose-PEG 400 ([employer] DRY EYE RELIEF) 0.2-0.2-1 % SOLN, Place 2 drops into both eyes as needed (Dry eyes)., Disp: , Rfl:  .  hydroxychloroquine (PLAQUENIL) 200 MG tablet, Take 1 tablet by mouth 2 (two) times a day., Disp: , Rfl:  .  metoprolol tartrate (LOPRESSOR) 25 MG tablet, TAKE 1 TABLET(25 MG) BY MOUTH TWICE DAILY, Disp: 180 tablet, Rfl: 1 .  Multiple Vitamin (MULTIVITAMIN WITH MINERALS) TABS tablet, Take 1 tablet by mouth daily., Disp: , Rfl:  .  nystatin (MYCOSTATIN/NYSTOP) powder, Apply topically 4 (four) times daily., Disp: 15 g, Rfl: 1 .  pantoprazole (PROTONIX) 40 MG tablet, TAKE 1 TABLET BY MOUTH EVERY MORNING, Disp: 30 tablet, Rfl: 6 .  predniSONE (DELTASONE) 5 MG tablet, Take 5 mg by mouth every other day. , Disp: , Rfl:  .  Respiratory Therapy Supplies (FLUTTER) DEVI, 1 Device by Does not apply route as needed., Disp: 1 each, Rfl: 0  Review of Systems:  Constitutional: Denies fever, chills, diaphoresis, appetite change and fatigue.   HEENT: Denies photophobia, eye pain, redness, hearing loss, ear pain, congestion, sore throat, rhinorrhea, sneezing, mouth sores, trouble swallowing, neck pain, neck stiffness and tinnitus.   Respiratory: Denies SOB, DOE, cough, chest tightness,  and wheezing.   Cardiovascular: Denies chest pain, palpitations and leg swelling.  Gastrointestinal: Denies nausea, vomiting, abdominal pain, diarrhea, constipation, blood in stool and abdominal distention.  Genitourinary: Denies dysuria, urgency, frequency, hematuria, flank pain and difficulty urinating.  Endocrine: Denies: hot or cold intolerance, sweats, changes in hair or nails, polyuria, polydipsia. Musculoskeletal: Denies myalgias, back pain, joint swelling, arthralgias and gait problem.  Skin: Denies pallor, rash and wound.  Neurological: Denies dizziness, seizures, syncope, weakness, light-headedness, numbness and headaches.  Hematological: Denies adenopathy. Easy bruising, personal or family bleeding history  Psychiatric/Behavioral: Denies suicidal ideation, mood changes, confusion, nervousness, sleep disturbance and agitation    Physical Exam: Vitals:   12/31/19 1057  BP: 120/80  Pulse: 74  Temp: (!) 97.2 F (36.2 C)  TempSrc: Temporal  SpO2: 96%  Weight: 158 lb (71.7 kg)  Height: 5\' 4"  (1.626 m)    Body mass index is 27.12 kg/m.   Constitutional: NAD, calm, comfortable Eyes: PERRL, lids and conjunctivae normal, wears corrective lenses ENMT: Mucous membranes are moist. Respiratory: clear to auscultation bilaterally, no wheezing, no crackles. Normal respiratory effort. No accessory muscle use.  Cardiovascular: Regular rate and irregular rhythm, no murmurs / rubs / gallops. No extremity edema.  Neurologic: Grossly intact and nonfocal Psychiatric: Normal judgment and insight. Alert and oriented x 3. Normal mood.    Impression and Plan:  Chronic respiratory failure with hypoxia (HCC) ILD (interstitial lung disease)  (HCC) Pulmonary hypertension, unspecified (HCC) -Followed by pulmonary and rheumatology, prednisone is being titrated down, currently 2.5 mg every other day.  Esophageal dysphagia -This is her main concern today, has follow-up with GI next couple days. -Would not be surprised if she requires another EGD with dilatation.  Essential hypertension -Well-controlled  Hyponatremia - Plan: Basic metabolic panel  Vitamin D deficiency  - Plan: VITAMIN D 25 Hydroxy (Vit-D Deficiency, Fractures)   Patient Instructions  -Nice seeing you today!!  -Lab work today; will notify you once results are available.  -Prevnar vaccine today.  -Schedule follow up in 4 months for your physical. Please come in fasting that day.     , MD [facility]

## 2019-12-31 NOTE — Addendum Note (Signed)
Addended by: Kern Reap B on: 12/31/2019 05:11 PM   Modules accepted: Orders

## 2019-12-31 NOTE — Patient Instructions (Signed)
-  Nice seeing you today!!  -Lab work today; will notify you once results are available.  -Prevnar vaccine today.  -Schedule follow up in 4 months for your physical. Please come in fasting that day.

## 2020-01-03 ENCOUNTER — Encounter: Payer: Self-pay | Admitting: Internal Medicine

## 2020-01-03 ENCOUNTER — Other Ambulatory Visit: Payer: Self-pay | Admitting: Physician Assistant

## 2020-01-03 DIAGNOSIS — Z8719 Personal history of other diseases of the digestive system: Secondary | ICD-10-CM | POA: Diagnosis not present

## 2020-01-03 DIAGNOSIS — R1310 Dysphagia, unspecified: Secondary | ICD-10-CM

## 2020-01-03 DIAGNOSIS — K219 Gastro-esophageal reflux disease without esophagitis: Secondary | ICD-10-CM | POA: Diagnosis not present

## 2020-01-07 ENCOUNTER — Other Ambulatory Visit: Payer: Self-pay | Admitting: Physician Assistant

## 2020-01-07 ENCOUNTER — Ambulatory Visit
Admission: RE | Admit: 2020-01-07 | Discharge: 2020-01-07 | Disposition: A | Discharge status: Home / Self Care | Payer: Medicare PPO | Source: Ambulatory Visit | Attending: Physician Assistant | Admitting: Physician Assistant

## 2020-01-07 DIAGNOSIS — R1310 Dysphagia, unspecified: Secondary | ICD-10-CM

## 2020-01-07 DIAGNOSIS — K224 Dyskinesia of esophagus: Secondary | ICD-10-CM | POA: Diagnosis not present

## 2020-01-17 ENCOUNTER — Other Ambulatory Visit: Payer: Self-pay | Admitting: Internal Medicine

## 2020-01-18 DIAGNOSIS — I272 Pulmonary hypertension, unspecified: Secondary | ICD-10-CM | POA: Diagnosis not present

## 2020-01-18 DIAGNOSIS — J849 Interstitial pulmonary disease, unspecified: Secondary | ICD-10-CM | POA: Diagnosis not present

## 2020-01-27 ENCOUNTER — Telehealth (HOSPITAL_COMMUNITY): Payer: Self-pay | Admitting: Vascular Surgery

## 2020-01-27 NOTE — Telephone Encounter (Signed)
Pt called to cancel CT 02/11/20, PT does not want to reschedule

## 2020-02-11 ENCOUNTER — Ambulatory Visit (HOSPITAL_COMMUNITY): Payer: Medicare PPO

## 2020-02-17 DIAGNOSIS — I272 Pulmonary hypertension, unspecified: Secondary | ICD-10-CM | POA: Diagnosis not present

## 2020-02-17 DIAGNOSIS — J849 Interstitial pulmonary disease, unspecified: Secondary | ICD-10-CM | POA: Diagnosis not present

## 2020-03-19 DIAGNOSIS — J849 Interstitial pulmonary disease, unspecified: Secondary | ICD-10-CM | POA: Diagnosis not present

## 2020-03-19 DIAGNOSIS — I272 Pulmonary hypertension, unspecified: Secondary | ICD-10-CM | POA: Diagnosis not present

## 2020-03-20 DIAGNOSIS — E663 Overweight: Secondary | ICD-10-CM | POA: Diagnosis not present

## 2020-03-20 DIAGNOSIS — J849 Interstitial pulmonary disease, unspecified: Secondary | ICD-10-CM | POA: Diagnosis not present

## 2020-03-20 DIAGNOSIS — I4891 Unspecified atrial fibrillation: Secondary | ICD-10-CM | POA: Diagnosis not present

## 2020-03-20 DIAGNOSIS — I272 Pulmonary hypertension, unspecified: Secondary | ICD-10-CM | POA: Diagnosis not present

## 2020-03-20 DIAGNOSIS — I7300 Raynaud's syndrome without gangrene: Secondary | ICD-10-CM | POA: Diagnosis not present

## 2020-03-20 DIAGNOSIS — M351 Other overlap syndromes: Secondary | ICD-10-CM | POA: Diagnosis not present

## 2020-03-20 DIAGNOSIS — Z6826 Body mass index (BMI) 26.0-26.9, adult: Secondary | ICD-10-CM | POA: Diagnosis not present

## 2020-03-25 ENCOUNTER — Other Ambulatory Visit: Payer: Self-pay

## 2020-03-25 ENCOUNTER — Ambulatory Visit (INDEPENDENT_AMBULATORY_CARE_PROVIDER_SITE_OTHER): Payer: Medicare PPO | Admitting: Internal Medicine

## 2020-03-25 ENCOUNTER — Encounter: Payer: Self-pay | Admitting: Internal Medicine

## 2020-03-25 VITALS — BP 118/78 | HR 73 | Temp 98.1°F | Ht 64.0 in | Wt 157.1 lb

## 2020-03-25 DIAGNOSIS — I10 Essential (primary) hypertension: Secondary | ICD-10-CM | POA: Diagnosis not present

## 2020-03-25 DIAGNOSIS — Z0000 Encounter for general adult medical examination without abnormal findings: Secondary | ICD-10-CM | POA: Diagnosis not present

## 2020-03-25 DIAGNOSIS — I272 Pulmonary hypertension, unspecified: Secondary | ICD-10-CM

## 2020-03-25 DIAGNOSIS — J849 Interstitial pulmonary disease, unspecified: Secondary | ICD-10-CM | POA: Diagnosis not present

## 2020-03-25 DIAGNOSIS — R7302 Impaired glucose tolerance (oral): Secondary | ICD-10-CM

## 2020-03-25 DIAGNOSIS — R6889 Other general symptoms and signs: Secondary | ICD-10-CM | POA: Diagnosis not present

## 2020-03-25 DIAGNOSIS — I4891 Unspecified atrial fibrillation: Secondary | ICD-10-CM | POA: Diagnosis not present

## 2020-03-25 DIAGNOSIS — E559 Vitamin D deficiency, unspecified: Secondary | ICD-10-CM | POA: Diagnosis not present

## 2020-03-25 NOTE — Progress Notes (Signed)
Established Patient Office Visit     This visit occurred during the SARS-CoV-2 public health emergency.  Safety protocols were in place, including screening questions prior to the visit, additional usage of staff PPE, and extensive cleaning of exam room while observing appropriate contact time as indicated for disinfecting solutions.    CC/Reason for Visit: Annual preventive exam and subsequent Medicare wellness visit  HPI: Sabrina Douglas is a 79 y.o. female who is coming in today for the above mentioned reasons. Past Medical History is significant for:  Hypertension that has been well controlled, well-controlled hyperlipidemia, history of atrial fibrillation that has been rate controlled and followed by Dr. Haroldine Laws. She also has a history of interstitial lung disease and pulmonary hypertension followed by Dr. [name] and Dr. [name] with rheumatology. She has no acute complaints today.  She has routine eye and dental care.  She does not do much in the way of exercise.  She is due for Tdap and shingles vaccines.  She is requesting a letter to excuse her from jury duty.  She is electing to no longer pursue cancer screening due to her age.   Past Medical/Surgical History: Past Medical History:  Diagnosis Date  . Anxiety   . Depression   . Hyperlipidemia     Past Surgical History:  Procedure Laterality Date  . BALLOON DILATION N/A 10/04/2018   Procedure: BALLOON DILATION;  Surgeon: Otis Brace, MD;  Location: [facility];  Service: Gastroenterology;  Laterality: N/A;  . BIOPSY  10/04/2018   Procedure: BIOPSY;  Surgeon: Otis Brace, MD;  Location: [facility];  Service: Gastroenterology;;  . ESOPHAGOGASTRODUODENOSCOPY (EGD) WITH PROPOFOL N/A 10/04/2018   Procedure: ESOPHAGOGASTRODUODENOSCOPY (EGD) WITH PROPOFOL;  Surgeon: Otis Brace, MD;  Location: [facility];  Service: Gastroenterology;  Laterality: N/A;  . RIGHT HEART CATH N/A 10/15/2018   Procedure:  RIGHT HEART CATH;  Surgeon: Jolaine Artist, MD;  Location: [facility];  Service: Cardiovascular;  Laterality: N/A;    Social History:  reports that she is a non-smoker but has been exposed to tobacco smoke. She has never used smokeless tobacco. She reports that she does not drink alcohol. No history on file for drug use.  Allergies: No Known Allergies  Family History:  Family History  Problem Relation Age of Onset  . Sudden death Mother 3       sudden death post hysterectomy  . Prostate cancer Father   . Diabetes Father      Current Outpatient Medications:  .  albuterol (PROVENTIL HFA;VENTOLIN HFA) 108 (90 Base) MCG/ACT inhaler, Inhale 2 puffs into the lungs every 6 (six) hours as needed for wheezing or shortness of breath., Disp: 1 Inhaler, Rfl: 6 .  apixaban (ELIQUIS) 5 MG TABS tablet, TAKE 1 TABLET(5 MG) BY MOUTH TWICE DAILY, Disp: 60 tablet, Rfl: 11 .  furosemide (LASIX) 40 MG tablet, TAKE 1 TABLET BY MOUTH DAILY, Disp: 90 tablet, Rfl: 1 .  Glycerin-Hypromellose-PEG 400 ([facility] DRY EYE RELIEF) 0.2-0.2-1 % SOLN, Place 2 drops into both eyes as needed (Dry eyes)., Disp: , Rfl:  .  hydroxychloroquine (PLAQUENIL) 200 MG tablet, Take 1 tablet by mouth 2 (two) times a day., Disp: , Rfl:  .  metoprolol tartrate (LOPRESSOR) 25 MG tablet, TAKE 1 TABLET(25 MG) BY MOUTH TWICE DAILY, Disp: 180 tablet, Rfl: 1 .  Multiple Vitamin (MULTIVITAMIN WITH MINERALS) TABS tablet, Take 1 tablet by mouth daily., Disp: , Rfl:  .  nystatin (MYCOSTATIN/NYSTOP) powder, Apply topically 4 (four)  times daily., Disp: 15 g, Rfl: 1 .  pantoprazole (PROTONIX) 40 MG tablet, TAKE 1 TABLET BY MOUTH EVERY MORNING, Disp: 30 tablet, Rfl: 6 .  Respiratory Therapy Supplies (FLUTTER) DEVI, 1 Device by Does not apply route as needed., Disp: 1 each, Rfl: 0  Review of Systems:  Constitutional: Denies fever, chills, diaphoresis, appetite change and fatigue.  HEENT: Denies photophobia, eye pain, redness, hearing loss,  ear pain, congestion, sore throat, rhinorrhea, sneezing, mouth sores, trouble swallowing, neck pain, neck stiffness and tinnitus.   Respiratory: Denies SOB, DOE, cough, chest tightness,  and wheezing.   Cardiovascular: Denies chest pain, palpitations and leg swelling.  Gastrointestinal: Denies nausea, vomiting, abdominal pain, diarrhea, constipation, blood in stool and abdominal distention.  Genitourinary: Denies dysuria, urgency, frequency, hematuria, flank pain and difficulty urinating.  Endocrine: Denies: hot or cold intolerance, sweats, changes in hair or nails, polyuria, polydipsia. Musculoskeletal: Denies myalgias, back pain, joint swelling, arthralgias and gait problem.  Skin: Denies pallor, rash and wound.  Neurological: Denies dizziness, seizures, syncope, weakness, light-headedness, numbness and headaches.  Hematological: Denies adenopathy. Easy bruising, personal or family bleeding history  Psychiatric/Behavioral: Denies suicidal ideation, mood changes, confusion, nervousness, sleep disturbance and agitation    Physical Exam: Vitals:   03/25/20 0836  BP: 118/78  Pulse: 73  Temp: 98.1 F (36.7 C)  TempSrc: Oral  Weight: 157 lb 1.6 oz (71.3 kg)  Height: 5' 4"  (1.626 m)    Body mass index is 26.97 kg/m.   Constitutional: NAD, calm, comfortable Eyes: PERRL, lids and conjunctivae normal ENMT: Mucous membranes are moist. Tympanic membrane is pearly white, no erythema or bulging. Neck: normal, supple, no masses, no thyromegaly Respiratory: clear to auscultation bilaterally, no wheezing, no crackles. Normal respiratory effort. No accessory muscle use.  Cardiovascular: Regular rate and rhythm, no murmurs / rubs / gallops. No extremity edema. 2+ pedal pulses.  Abdomen: no tenderness, no masses palpated. No hepatosplenomegaly. Bowel sounds positive.  Musculoskeletal: no clubbing / cyanosis. No joint deformity upper and lower extremities. Good ROM, no contractures. Normal muscle  tone.  Skin: no rashes, lesions, ulcers. No induration Neurologic: CN 2-12 grossly intact. Sensation intact, DTR normal. Strength 5/5 in all 4.  Psychiatric: Normal judgment and insight. Alert and oriented x 3. Normal mood.    Subsequent Medicare wellness visit   1. Risk factors, based on past  M,S,F -cardiovascular disease risk factors include age, history of hypertension   2.  Physical activities: Very sedentary   3.  Depression/mood:  Stable, not depressed   4.  Hearing:  No perceived issues   5.  ADL's: Independent in all ADLs   6.  Fall risk:  Low fall risk   7.  Home safety: No problems identified   8.  Height weight, and visual acuity: Height and weight as above, visual acuity is 20/30 with eyes together and independently   9.  Counseling:  Advised to get Tdap and shingles vaccine at her pharmacy, I will write an excuse letter for jury duty due to her age and comorbidities   110. Lab orders based on risk factors: Laboratory update will be reviewed   11. Referral :  None today   12. Care plan:  Follow-up with me in 6 months   13. Cognitive assessment:  No cognitive impairment   14. Screening: Patient provided with a written and personalized 5-10 year screening schedule in the AVS.   yes   15. Provider List Update:   PCP, cardiologist Dr. Haroldine Laws, rheumatologist Dr.  [name], pulmonologist Dr. [name]  16. Advance Directives: Full code     Office Visit from 03/25/2020 in [facility] [place]  PHQ-9 Total Score 2      Fall Risk  03/25/2020 02/05/2019 10/10/2018  Falls in the past year? 0 0 0  Number falls in past yr: 0 0 0  Injury with Fall? 0 0 0     Impression and Plan:  Encounter for preventive health examination -She has routine eye and dental care. -Needs Tdap and shingles vaccines, otherwise immunizations are up-to-date including Covid x2. -Screening labs today. -Healthy lifestyle discussed in detail. -She has elected to defer further cancer  screening due to age. -Repeat DEXA scan in 2022.  ILD (interstitial lung disease) ([place])  Pulmonary hypertension, unspecified ([place]) -Followed by pulmonary and rheumatology  Atrial fibrillation with RVR ([place])  -Rate controlled on metoprolol, anticoagulated on Eliquis.  IGT (impaired glucose tolerance)  - Plan: Hemoglobin A1c    Patient Instructions  -Nice seeing you today!!  -Lab work today; will notify you once results are available.  -Remember your tetanus and shingles vaccines at the drug store. Flu shot in September.  -Schedule follow up in 4 months.   Preventive Care 25 Years and Older, Female Preventive care refers to lifestyle choices and visits with your health care provider that can promote health and wellness. This includes:  A yearly physical exam. This is also called an annual well check.  Regular dental and eye exams.  Immunizations.  Screening for certain conditions.  Healthy lifestyle choices, such as diet and exercise. What can I expect for my preventive care visit? Physical exam Your health care provider will check:  Height and weight. These may be used to calculate body mass index (BMI), which is a measurement that tells if you are at a healthy weight.  Heart rate and blood pressure.  Your skin for abnormal spots. Counseling Your health care provider may ask you questions about:  Alcohol, tobacco, and drug use.  Emotional well-being.  Home and relationship well-being.  Sexual activity.  Eating habits.  History of falls.  Memory and ability to understand (cognition).  Work and work Statistician.  Pregnancy and menstrual history. What immunizations do I need?  Influenza (flu) vaccine  This is recommended every year. Tetanus, diphtheria, and pertussis (Tdap) vaccine  You may need a Td booster every 10 years. Varicella (chickenpox) vaccine  You may need this vaccine if you have not already been vaccinated. Zoster (shingles)  vaccine  You may need this after age 82. Pneumococcal conjugate (PCV13) vaccine  One dose is recommended after age 69. Pneumococcal polysaccharide (PPSV23) vaccine  One dose is recommended after age 29. Measles, mumps, and rubella (MMR) vaccine  You may need at least one dose of MMR if you were born in 1957 or later. You may also need a second dose. Meningococcal conjugate (MenACWY) vaccine  You may need this if you have certain conditions. Hepatitis A vaccine  You may need this if you have certain conditions or if you travel or work in places where you may be exposed to hepatitis A. Hepatitis B vaccine  You may need this if you have certain conditions or if you travel or work in places where you may be exposed to hepatitis B. Haemophilus influenzae type b (Hib) vaccine  You may need this if you have certain conditions. You may receive vaccines as individual doses or as more than one vaccine together in one shot (combination vaccines). Talk with your  health care provider about the risks and benefits of combination vaccines. What tests do I need? Blood tests  Lipid and cholesterol levels. These may be checked every 5 years, or more frequently depending on your overall health.  Hepatitis C test.  Hepatitis B test. Screening  Lung cancer screening. You may have this screening every year starting at age 96 if you have a 30-pack-year history of smoking and currently smoke or have quit within the past 15 years.  Colorectal cancer screening. All adults should have this screening starting at age 25 and continuing until age 85. Your health care provider may recommend screening at age 38 if you are at increased risk. You will have tests every 1-10 years, depending on your results and the type of screening test.  Diabetes screening. This is done by checking your blood sugar (glucose) after you have not eaten for a while (fasting). You may have this done every 1-3 years.  Mammogram. This  may be done every 1-2 years. Talk with your health care provider about how often you should have regular mammograms.  BRCA-related cancer screening. This may be done if you have a family history of breast, ovarian, tubal, or peritoneal cancers. Other tests  Sexually transmitted disease (STD) testing.  Bone density scan. This is done to screen for osteoporosis. You may have this done starting at age 5. Follow these instructions at home: Eating and drinking  Eat a diet that includes fresh fruits and vegetables, whole grains, lean protein, and low-fat dairy products. Limit your intake of foods with high amounts of sugar, saturated fats, and salt.  Take vitamin and mineral supplements as recommended by your health care provider.  Do not drink alcohol if your health care provider tells you not to drink.  If you drink alcohol: ? Limit how much you have to 0-1 drink a day. ? Be aware of how much alcohol is in your drink. In the [place] one drink equals one 12 oz bottle of beer (355 mL), one 5 oz glass of wine (148 mL), or one 1 oz glass of hard liquor (44 mL). Lifestyle  Take daily care of your teeth and gums.  Stay active. Exercise for at least 30 minutes on 5 or more days each week.  Do not use any products that contain nicotine or tobacco, such as cigarettes, e-cigarettes, and chewing tobacco. If you need help quitting, ask your health care provider.  If you are sexually active, practice safe sex. Use a condom or other form of protection in order to prevent STIs (sexually transmitted infections).  Talk with your health care provider about taking a low-dose aspirin or statin. What's next?  Go to your health care provider once a year for a well check visit.  Ask your health care provider how often you should have your eyes and teeth checked.  Stay up to date on all vaccines. This information is not intended to replace advice given to you by your health care provider. Make sure you  discuss any questions you have with your health care provider. Document Revised: 07/19/2018 Document Reviewed: 07/19/2018 Elsevier Patient Education  2020 [place], [place] [facility]

## 2020-03-25 NOTE — Patient Instructions (Signed)
-Nice seeing you today!!  -Lab work today; will notify you once results are available.  -Remember your tetanus and shingles vaccines at the drug store. Flu shot in September.  -Schedule follow up in 4 months.   Preventive Care 81 Years and Older, Female Preventive care refers to lifestyle choices and visits with your health care provider that can promote health and wellness. This includes:  A yearly physical exam. This is also called an annual well check.  Regular dental and eye exams.  Immunizations.  Screening for certain conditions.  Healthy lifestyle choices, such as diet and exercise. What can I expect for my preventive care visit? Physical exam Your health care provider will check:  Height and weight. These may be used to calculate body mass index (BMI), which is a measurement that tells if you are at a healthy weight.  Heart rate and blood pressure.  Your skin for abnormal spots. Counseling Your health care provider may ask you questions about:  Alcohol, tobacco, and drug use.  Emotional well-being.  Home and relationship well-being.  Sexual activity.  Eating habits.  History of falls.  Memory and ability to understand (cognition).  Work and work Statistician.  Pregnancy and menstrual history. What immunizations do I need?  Influenza (flu) vaccine  This is recommended every year. Tetanus, diphtheria, and pertussis (Tdap) vaccine  You may need a Td booster every 10 years. Varicella (chickenpox) vaccine  You may need this vaccine if you have not already been vaccinated. Zoster (shingles) vaccine  You may need this after age 81. Pneumococcal conjugate (PCV13) vaccine  One dose is recommended after age 7. Pneumococcal polysaccharide (PPSV23) vaccine  One dose is recommended after age 81. Measles, mumps, and rubella (MMR) vaccine  You may need at least one dose of MMR if you were born in 1957 or later. You may also need a second  dose. Meningococcal conjugate (MenACWY) vaccine  You may need this if you have certain conditions. Hepatitis A vaccine  You may need this if you have certain conditions or if you travel or work in places where you may be exposed to hepatitis A. Hepatitis B vaccine  You may need this if you have certain conditions or if you travel or work in places where you may be exposed to hepatitis B. Haemophilus influenzae type b (Hib) vaccine  You may need this if you have certain conditions. You may receive vaccines as individual doses or as more than one vaccine together in one shot (combination vaccines). Talk with your health care provider about the risks and benefits of combination vaccines. What tests do I need? Blood tests  Lipid and cholesterol levels. These may be checked every 5 years, or more frequently depending on your overall health.  Hepatitis C test.  Hepatitis B test. Screening  Lung cancer screening. You may have this screening every year starting at age 81 if you have a 30-pack-year history of smoking and currently smoke or have quit within the past 15 years.  Colorectal cancer screening. All adults should have this screening starting at age 81 and continuing until age 62. Your health care provider may recommend screening at age 81 if you are at increased risk. You will have tests every 1-10 years, depending on your results and the type of screening test.  Diabetes screening. This is done by checking your blood sugar (glucose) after you have not eaten for a while (fasting). You may have this done every 1-3 years.  Mammogram. This may be  done every 1-2 years. Talk with your health care provider about how often you should have regular mammograms.  BRCA-related cancer screening. This may be done if you have a family history of breast, ovarian, tubal, or peritoneal cancers. Other tests  Sexually transmitted disease (STD) testing.  Bone density scan. This is done to screen for  osteoporosis. You may have this done starting at age 81. Follow these instructions at home: Eating and drinking  Eat a diet that includes fresh fruits and vegetables, whole grains, lean protein, and low-fat dairy products. Limit your intake of foods with high amounts of sugar, saturated fats, and salt.  Take vitamin and mineral supplements as recommended by your health care provider.  Do not drink alcohol if your health care provider tells you not to drink.  If you drink alcohol: ? Limit how much you have to 0-1 drink a day. ? Be aware of how much alcohol is in your drink. In the [place] one drink equals one 12 oz bottle of beer (355 mL), one 5 oz glass of wine (148 mL), or one 1 oz glass of hard liquor (44 mL). Lifestyle  Take daily care of your teeth and gums.  Stay active. Exercise for at least 30 minutes on 5 or more days each week.  Do not use any products that contain nicotine or tobacco, such as cigarettes, e-cigarettes, and chewing tobacco. If you need help quitting, ask your health care provider.  If you are sexually active, practice safe sex. Use a condom or other form of protection in order to prevent STIs (sexually transmitted infections).  Talk with your health care provider about taking a low-dose aspirin or statin. What's next?  Go to your health care provider once a year for a well check visit.  Ask your health care provider how often you should have your eyes and teeth checked.  Stay up to date on all vaccines. This information is not intended to replace advice given to you by your health care provider. Make sure you discuss any questions you have with your health care provider. Document Revised: 07/19/2018 Document Reviewed: 07/19/2018 Elsevier Patient Education  2020 [employer].

## 2020-03-26 ENCOUNTER — Other Ambulatory Visit: Payer: Self-pay | Admitting: Internal Medicine

## 2020-03-26 ENCOUNTER — Encounter: Payer: Self-pay | Admitting: Internal Medicine

## 2020-03-26 DIAGNOSIS — E559 Vitamin D deficiency, unspecified: Secondary | ICD-10-CM | POA: Insufficient documentation

## 2020-03-26 LAB — COMPREHENSIVE METABOLIC PANEL
AG Ratio: 1.2 (calc) (ref 1.0–2.5)
ALT: 9 U/L (ref 6–29)
AST: 24 U/L (ref 10–35)
Albumin: 4.1 g/dL (ref 3.6–5.1)
Alkaline phosphatase (APISO): 77 U/L (ref 37–153)
BUN/Creatinine Ratio: 18 (calc) (ref 6–22)
BUN: 16 mg/dL (ref 7–25)
CO2: 31 mmol/L (ref 20–32)
Calcium: 9.9 mg/dL (ref 8.6–10.4)
Chloride: 101 mmol/L (ref 98–110)
Creat: 0.89 mg/dL — ABNORMAL HIGH (ref 0.60–0.88)
Globulin: 3.3 g/dL (calc) (ref 1.9–3.7)
Glucose, Bld: 93 mg/dL (ref 65–99)
Potassium: 4.6 mmol/L (ref 3.5–5.3)
Sodium: 140 mmol/L (ref 135–146)
Total Bilirubin: 0.4 mg/dL (ref 0.2–1.2)
Total Protein: 7.4 g/dL (ref 6.1–8.1)

## 2020-03-26 LAB — LIPID PANEL
Cholesterol: 189 mg/dL (ref <200)
HDL: 53 mg/dL (ref >=50)
LDL Cholesterol (Calc): 114 mg/dL (calc) — ABNORMAL HIGH
Non-HDL Cholesterol (Calc): 136 mg/dL (calc) — ABNORMAL HIGH (ref <130)
Total CHOL/HDL Ratio: 3.6 (calc) (ref <5.0)
Triglycerides: 109 mg/dL (ref <150)

## 2020-03-26 LAB — CBC WITH DIFFERENTIAL/PLATELET
Absolute Monocytes: 406 cells/uL (ref 200–950)
Basophils Absolute: 41 cells/uL (ref 0–200)
Basophils Relative: 1 %
Eosinophils Absolute: 111 cells/uL (ref 15–500)
Eosinophils Relative: 2.7 %
HCT: 40.5 % (ref 35.0–45.0)
Hemoglobin: 13.3 g/dL (ref 11.7–15.5)
Lymphs Abs: 882 cells/uL (ref 850–3900)
MCH: 31.4 pg (ref 27.0–33.0)
MCHC: 32.8 g/dL (ref 32.0–36.0)
MCV: 95.7 fL (ref 80.0–100.0)
MPV: 9.4 fL (ref 7.5–12.5)
Monocytes Relative: 9.9 %
Neutro Abs: 2661 cells/uL (ref 1500–7800)
Neutrophils Relative %: 64.9 %
Platelets: 325 10*3/uL (ref 140–400)
RBC: 4.23 10*6/uL (ref 3.80–5.10)
RDW: 11.1 % (ref 11.0–15.0)
Total Lymphocyte: 21.5 %
WBC: 4.1 10*3/uL (ref 3.8–10.8)

## 2020-03-26 LAB — VITAMIN D 25 HYDROXY (VIT D DEFICIENCY, FRACTURES): Vit D, 25-Hydroxy: 29 ng/mL — ABNORMAL LOW (ref 30–100)

## 2020-03-26 LAB — TSH: TSH: 2.17 mIU/L (ref 0.40–4.50)

## 2020-03-26 LAB — HEMOGLOBIN A1C
Hgb A1c MFr Bld: 5.6 % of total Hgb (ref <5.7)
Mean Plasma Glucose: 114 (calc)
eAG (mmol/L): 6.3 (calc)

## 2020-03-26 LAB — VITAMIN B12: Vitamin B-12: 602 pg/mL (ref 200–1100)

## 2020-03-26 MED ORDER — VITAMIN D (ERGOCALCIFEROL) 1.25 MG (50000 UNIT) PO CAPS: 50000.0000 [IU] | ORAL_CAPSULE | ORAL | 0 refills | Status: AC

## 2020-03-27 ENCOUNTER — Other Ambulatory Visit: Payer: Self-pay | Admitting: Internal Medicine

## 2020-03-27 DIAGNOSIS — E559 Vitamin D deficiency, unspecified: Secondary | ICD-10-CM

## 2020-04-15 ENCOUNTER — Encounter: Payer: Self-pay | Admitting: Internal Medicine

## 2020-04-19 DIAGNOSIS — I272 Pulmonary hypertension, unspecified: Secondary | ICD-10-CM | POA: Diagnosis not present

## 2020-04-19 DIAGNOSIS — J849 Interstitial pulmonary disease, unspecified: Secondary | ICD-10-CM | POA: Diagnosis not present

## 2020-04-21 ENCOUNTER — Other Ambulatory Visit (HOSPITAL_COMMUNITY)
Admission: RE | Admit: 2020-04-21 | Discharge: 2020-04-21 | Disposition: A | Discharge status: Home / Self Care | Payer: Medicare PPO | Source: Ambulatory Visit | Attending: Pulmonary Disease | Admitting: Pulmonary Disease

## 2020-04-21 DIAGNOSIS — Z20822 Contact with and (suspected) exposure to covid-19: Secondary | ICD-10-CM | POA: Diagnosis not present

## 2020-04-21 DIAGNOSIS — Z01812 Encounter for preprocedural laboratory examination: Secondary | ICD-10-CM | POA: Diagnosis not present

## 2020-04-21 LAB — SARS CORONAVIRUS 2 (TAT 6-24 HRS): SARS Coronavirus 2: NEGATIVE

## 2020-04-24 ENCOUNTER — Other Ambulatory Visit: Payer: Self-pay

## 2020-04-24 ENCOUNTER — Ambulatory Visit (INDEPENDENT_AMBULATORY_CARE_PROVIDER_SITE_OTHER): Payer: Medicare PPO | Admitting: Pulmonary Disease

## 2020-04-24 DIAGNOSIS — J849 Interstitial pulmonary disease, unspecified: Secondary | ICD-10-CM | POA: Diagnosis not present

## 2020-04-24 LAB — PULMONARY FUNCTION TEST
DL/VA % pred: 110 %
DL/VA: 4.45 ml/min/mmHg/L
DLCO cor % pred: 62 %
DLCO cor: 12.06 ml/min/mmHg
DLCO unc % pred: 62 %
DLCO unc: 12.06 ml/min/mmHg
FEF 25-75 Post: 1.74 L/sec
FEF 25-75 Pre: 2.25 L/sec
FEF2575-%Change-Post: -22 %
FEF2575-%Pred-Post: 125 %
FEF2575-%Pred-Pre: 162 %
FEV1-%Change-Post: -3 %
FEV1-%Pred-Post: 89 %
FEV1-%Pred-Pre: 92 %
FEV1-Post: 1.48 L
FEV1-Pre: 1.53 L
FEV1FVC-%Change-Post: 1 %
FEV1FVC-%Pred-Pre: 113 %
FEV6-%Change-Post: -4 %
FEV6-%Pred-Post: 83 %
FEV6-%Pred-Pre: 86 %
FEV6-Post: 1.7 L
FEV6-Pre: 1.77 L
FEV6FVC-%Pred-Post: 104 %
FEV6FVC-%Pred-Pre: 104 %
FVC-%Change-Post: -5 %
FVC-%Pred-Post: 79 %
FVC-%Pred-Pre: 84 %
FVC-Post: 1.7 L
FVC-Pre: 1.8 L
Post FEV1/FVC ratio: 87 %
Post FEV6/FVC ratio: 100 %
Pre FEV1/FVC ratio: 85 %
Pre FEV6/FVC Ratio: 100 %
RV % pred: 60 %
RV: 1.47 L
TLC % pred: 65 %
TLC: 3.41 L

## 2020-04-24 NOTE — Progress Notes (Signed)
Full PFT performed today. °

## 2020-04-28 NOTE — Progress Notes (Signed)
Attempted to call patient about results but there was no answer or voicemail on either phone number listed. Will try again at another time.

## 2020-05-01 ENCOUNTER — Encounter: Payer: Self-pay | Admitting: Internal Medicine

## 2020-05-01 NOTE — Progress Notes (Signed)
Called and spoke with patient about PFT results per Dr Vassie Loll. All questions answered and patient expressed full understanding. Confirmed already scheduled follow up appointment with Dr Vassie Loll on 05/19/2020 at 3pm. Nothing further needed at this time.

## 2020-05-19 ENCOUNTER — Ambulatory Visit: Payer: Medicare PPO | Admitting: Pulmonary Disease

## 2020-05-19 ENCOUNTER — Ambulatory Visit (INDEPENDENT_AMBULATORY_CARE_PROVIDER_SITE_OTHER): Payer: Medicare PPO

## 2020-05-19 ENCOUNTER — Encounter: Payer: Self-pay | Admitting: Pulmonary Disease

## 2020-05-19 ENCOUNTER — Other Ambulatory Visit: Payer: Self-pay

## 2020-05-19 VITALS — BP 118/72 | HR 78 | Temp 96.5°F | Ht 64.0 in | Wt 159.6 lb

## 2020-05-19 DIAGNOSIS — J9611 Chronic respiratory failure with hypoxia: Secondary | ICD-10-CM

## 2020-05-19 DIAGNOSIS — R918 Other nonspecific abnormal finding of lung field: Secondary | ICD-10-CM | POA: Diagnosis not present

## 2020-05-19 DIAGNOSIS — J849 Interstitial pulmonary disease, unspecified: Secondary | ICD-10-CM | POA: Diagnosis not present

## 2020-05-19 DIAGNOSIS — I272 Pulmonary hypertension, unspecified: Secondary | ICD-10-CM | POA: Diagnosis not present

## 2020-05-19 DIAGNOSIS — Z23 Encounter for immunization: Secondary | ICD-10-CM

## 2020-05-19 NOTE — Patient Instructions (Signed)
Flu shot today. Chest x-ray today  Contact Dr. Dierdre Forth if joint symptoms or skin problems  get worse

## 2020-05-19 NOTE — Assessment & Plan Note (Signed)
Okay to continue to use oxygen as needed. Vaccinations are up-to-date

## 2020-05-19 NOTE — Assessment & Plan Note (Signed)
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

## 2020-05-19 NOTE — Progress Notes (Signed)
   Subjective:    Patient ID: Sabrina Douglas, female    DOB: 06/08/1941, 79 y.o.   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   SeesDr. Beekman-initiallystarted on Plaquenil and 10 mg of prednisone, gradually tapered to off 02/2020  Breathing is stable, cough has not returned.  She reports changes in her skin and some pitting on her nails, she was asked by PCP to apply Lamisil. Mild pedal edema She has had her eye exam. Wants to hold onto her oxygen  Chest x-ray was obtained today and independently reviewed shows mild interstitial prominence Significant tests/ events reviewed  PFTs 04/2020 -mild restriction, ratio 85, FEV1 92%, FVC 84%, TLC 65%, DLCO 62% >> much improved compared to prior   ANA positive1: 1280,Sm Ab 2.0, RNP > 8.0 ,CCP negative  10/02/2018 PFTs ratio 92, moderate to severe restriction FEV1 66%, FVC 56%, TLC 46%, DLCO 17% corrects to 51% for alveolar volume  Echo 2/2020RVSP 67 Cath 3/2020RA 3, PA 39/7 , PVR2.1 Wu  HRCT 10/2020Bilateral lower lobe predominant bronchiectasis and volume loss may be postinfectious or post inflammatory in etiology 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 Pleasant, elderly,obese, in no distress ENT - no lesions, no post nasal drip Neck: No JVD, no thyromegaly, no carotid  bruits Lungs: no use of accessory muscles, no dullness to percussion, RT basal rales no rhonchi  Cardiovascular: Rhythm regular, heart sounds  normal, no murmurs or gallops, no peripheral edema Musculoskeletal: No deformities, no cyanosis or clubbing , no tremors       Assessment & Plan:

## 2020-05-25 ENCOUNTER — Encounter: Payer: Self-pay | Admitting: Pulmonary Disease

## 2020-06-04 ENCOUNTER — Other Ambulatory Visit: Payer: Self-pay | Admitting: Internal Medicine

## 2020-06-08 ENCOUNTER — Encounter: Payer: Self-pay | Admitting: Internal Medicine

## 2020-06-19 DIAGNOSIS — J849 Interstitial pulmonary disease, unspecified: Secondary | ICD-10-CM | POA: Diagnosis not present

## 2020-06-19 DIAGNOSIS — I272 Pulmonary hypertension, unspecified: Secondary | ICD-10-CM | POA: Diagnosis not present

## 2020-06-23 DIAGNOSIS — I4891 Unspecified atrial fibrillation: Secondary | ICD-10-CM | POA: Diagnosis not present

## 2020-06-23 DIAGNOSIS — I272 Pulmonary hypertension, unspecified: Secondary | ICD-10-CM | POA: Diagnosis not present

## 2020-06-23 DIAGNOSIS — I7300 Raynaud's syndrome without gangrene: Secondary | ICD-10-CM | POA: Diagnosis not present

## 2020-06-23 DIAGNOSIS — M351 Other overlap syndromes: Secondary | ICD-10-CM | POA: Diagnosis not present

## 2020-06-23 DIAGNOSIS — J849 Interstitial pulmonary disease, unspecified: Secondary | ICD-10-CM | POA: Diagnosis not present

## 2020-06-23 DIAGNOSIS — Z6827 Body mass index (BMI) 27.0-27.9, adult: Secondary | ICD-10-CM | POA: Diagnosis not present

## 2020-06-23 DIAGNOSIS — E663 Overweight: Secondary | ICD-10-CM | POA: Diagnosis not present

## 2020-07-08 DIAGNOSIS — I4891 Unspecified atrial fibrillation: Secondary | ICD-10-CM | POA: Diagnosis not present

## 2020-07-08 DIAGNOSIS — J849 Interstitial pulmonary disease, unspecified: Secondary | ICD-10-CM | POA: Diagnosis not present

## 2020-07-08 DIAGNOSIS — E663 Overweight: Secondary | ICD-10-CM | POA: Diagnosis not present

## 2020-07-08 DIAGNOSIS — Z6827 Body mass index (BMI) 27.0-27.9, adult: Secondary | ICD-10-CM | POA: Diagnosis not present

## 2020-07-08 DIAGNOSIS — I272 Pulmonary hypertension, unspecified: Secondary | ICD-10-CM | POA: Diagnosis not present

## 2020-07-08 DIAGNOSIS — I7300 Raynaud's syndrome without gangrene: Secondary | ICD-10-CM | POA: Diagnosis not present

## 2020-07-08 DIAGNOSIS — M351 Other overlap syndromes: Secondary | ICD-10-CM | POA: Diagnosis not present

## 2020-07-11 ENCOUNTER — Ambulatory Visit: Payer: Medicare PPO | Attending: Internal Medicine

## 2020-07-11 DIAGNOSIS — Z23 Encounter for immunization: Secondary | ICD-10-CM

## 2020-07-11 NOTE — Progress Notes (Signed)
   Covid-19 Vaccination Clinic  Name:  Sabrina Douglas    MRN: [id number] DOB: 08/12/1940  07/11/2020  [name] was observed post Covid-19 immunization for 15 minutes without incident. She was provided with Vaccine Information Sheet and instruction to access the V-Safe system.   [name] was instructed to call 911 with any severe reactions post vaccine: Marland Kitchen Difficulty breathing  . Swelling of face and throat  . A fast heartbeat  . A bad rash all over body  . Dizziness and weakness   Immunizations Administered    No immunizations on file.

## 2020-07-17 ENCOUNTER — Ambulatory Visit: Payer: Medicare PPO | Admitting: Internal Medicine

## 2020-07-19 DIAGNOSIS — I272 Pulmonary hypertension, unspecified: Secondary | ICD-10-CM | POA: Diagnosis not present

## 2020-07-19 DIAGNOSIS — J849 Interstitial pulmonary disease, unspecified: Secondary | ICD-10-CM | POA: Diagnosis not present

## 2020-07-22 ENCOUNTER — Ambulatory Visit: Payer: Medicare PPO | Admitting: Internal Medicine

## 2020-07-22 ENCOUNTER — Encounter: Payer: Self-pay | Admitting: Internal Medicine

## 2020-07-22 ENCOUNTER — Other Ambulatory Visit: Payer: Self-pay

## 2020-07-22 VITALS — BP 102/68 | HR 82 | Temp 98.1°F | Wt 160.7 lb

## 2020-07-22 DIAGNOSIS — M351 Other overlap syndromes: Secondary | ICD-10-CM | POA: Diagnosis not present

## 2020-07-22 DIAGNOSIS — J9611 Chronic respiratory failure with hypoxia: Secondary | ICD-10-CM | POA: Diagnosis not present

## 2020-07-22 DIAGNOSIS — E559 Vitamin D deficiency, unspecified: Secondary | ICD-10-CM

## 2020-07-22 DIAGNOSIS — I10 Essential (primary) hypertension: Secondary | ICD-10-CM

## 2020-07-22 DIAGNOSIS — J849 Interstitial pulmonary disease, unspecified: Secondary | ICD-10-CM | POA: Diagnosis not present

## 2020-07-22 DIAGNOSIS — I272 Pulmonary hypertension, unspecified: Secondary | ICD-10-CM

## 2020-07-22 NOTE — Progress Notes (Signed)
Established Patient Office Visit     This visit occurred during the SARS-CoV-2 public health emergency.  Safety protocols were in place, including screening questions prior to the visit, additional usage of staff PPE, and extensive cleaning of exam room while observing appropriate contact time as indicated for disinfecting solutions.    CC/Reason for Visit: Follow-up chronic medical conditions  HPI: Sabrina Douglas is a 79 y.o. female who is coming in today for the above mentioned reasons. Past Medical History is significant for: Well-controlled hypertension and hyperlipidemia, history of A. fib that is rate controlled followed by cardiology.  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 Vassie Loll and Dr. Dierdre Forth.  She tells me that there are plans to discontinue her hydroxychloroquine and place her on mycophenolate.  She makes that transition this weekend.  She has otherwise been doing well.  Minor aches and pains that she associates stage.   Past Medical/Surgical History: Past Medical History:  Diagnosis Date  . Anxiety   . Depression   . Hyperlipidemia     Past Surgical History:  Procedure Laterality Date  . BALLOON DILATION N/A 10/04/2018   Procedure: BALLOON DILATION;  Surgeon: Kathi Der, MD;  Location: [facility];  Service: Gastroenterology;  Laterality: N/A;  . BIOPSY  10/04/2018   Procedure: BIOPSY;  Surgeon: Kathi Der, MD;  Location: [facility];  Service: Gastroenterology;;  . ESOPHAGOGASTRODUODENOSCOPY (EGD) WITH PROPOFOL N/A 10/04/2018   Procedure: ESOPHAGOGASTRODUODENOSCOPY (EGD) WITH PROPOFOL;  Surgeon: Kathi Der, MD;  Location: [facility];  Service: Gastroenterology;  Laterality: N/A;  . RIGHT HEART CATH N/A 10/15/2018   Procedure: RIGHT HEART CATH;  Surgeon: Dolores Patty, MD;  Location: [facility];  Service: Cardiovascular;  Laterality:  N/A;    Social History:  reports that she is a non-smoker but has been exposed to tobacco smoke. She has never used smokeless tobacco. She reports that she does not drink alcohol. No history on file for drug use.  Allergies: No Known Allergies  Family History:  Family History  Problem Relation Age of Onset  . Sudden death Mother 41       sudden death post hysterectomy  . Prostate cancer Father   . Diabetes Father      Current Outpatient Medications:  .  albuterol (PROVENTIL HFA;VENTOLIN HFA) 108 (90 Base) MCG/ACT inhaler, Inhale 2 puffs into the lungs every 6 (six) hours as needed for wheezing or shortness of breath., Disp: 1 Inhaler, Rfl: 6 .  apixaban (ELIQUIS) 5 MG TABS tablet, TAKE 1 TABLET(5 MG) BY MOUTH TWICE DAILY, Disp: 60 tablet, Rfl: 11 .  furosemide (LASIX) 40 MG tablet, TAKE 1 TABLET BY MOUTH DAILY, Disp: 90 tablet, Rfl: 1 .  Glycerin-Hypromellose-PEG 400 0.2-0.2-1 % SOLN, Place 2 drops into both eyes as needed (Dry eyes)., Disp: , Rfl:  .  hydroxychloroquine (PLAQUENIL) 200 MG tablet, Take 1 tablet by mouth 2 (two) times a day., Disp: , Rfl:  .  metoprolol tartrate (LOPRESSOR) 25 MG tablet, TAKE 1 TABLET(25 MG) BY MOUTH TWICE DAILY, Disp: 180 tablet, Rfl: 1 .  Multiple Vitamin (MULTIVITAMIN WITH MINERALS) TABS tablet, Take 1 tablet by mouth daily., Disp: , Rfl:  .  pantoprazole (PROTONIX) 40 MG tablet, TAKE 1 TABLET BY MOUTH EVERY MORNING, Disp: 30 tablet, Rfl: 6 .  Respiratory Therapy Supplies (FLUTTER) DEVI, 1 Device by Does not apply route as needed., Disp: 1 each, Rfl: 0 .  mycophenolate (CELLCEPT) 250 MG capsule, Take 250 mg by mouth in the morning and at bedtime. (Patient not taking: Reported on 07/22/2020), Disp: , Rfl:   Review of Systems:  Constitutional: Denies fever, chills, diaphoresis, appetite change and fatigue.  HEENT: Denies photophobia, eye pain, redness, hearing loss, ear pain, congestion, sore throat, rhinorrhea, sneezing, mouth sores, trouble  swallowing, neck pain, neck stiffness and tinnitus.   Respiratory: Denies SOB, DOE, cough, chest tightness,  and wheezing.   Cardiovascular: Denies chest pain, palpitations and leg swelling.  Gastrointestinal: Denies nausea, vomiting, abdominal pain, diarrhea, constipation, blood in stool and abdominal distention.  Genitourinary: Denies dysuria, urgency, frequency, hematuria, flank pain and difficulty urinating.  Endocrine: Denies: hot or cold intolerance, sweats, changes in hair or nails, polyuria, polydipsia. Musculoskeletal: Denies myalgias, back pain, joint swelling, arthralgias and gait problem.  Skin: Denies pallor, rash and wound.  Neurological: Denies dizziness, seizures, syncope, weakness, light-headedness, numbness and headaches.  Hematological: Denies adenopathy. Easy bruising, personal or family bleeding history  Psychiatric/Behavioral: Denies suicidal ideation, mood changes, confusion, nervousness, sleep disturbance and agitation    Physical Exam: Vitals:   07/22/20 1127  BP: 102/68  Pulse: 82  Temp: 98.1 F (36.7 C)  TempSrc: Oral  SpO2: 97%  Weight: 160 lb 11.2 oz (72.9 kg)    Body mass index is 27.58 kg/m.   Constitutional: NAD, calm, comfortable Eyes: PERRL, lids and conjunctivae normal, wears corrective lenses ENMT: Mucous membranes are moist.  Respiratory: clear to auscultation bilaterally, no wheezing, no crackles. Normal respiratory effort. No accessory muscle use.  Cardiovascular: Regular rate and rhythm, no murmurs / rubs / gallops. No extremity edema. Neurologic: Grossly intact and nonfocal Psychiatric: Normal judgment and insight. Alert and oriented x 3. Normal mood.    Impression and Plan:  MCTD (mixed connective tissue disease) (HCC) Chronic respiratory failure with hypoxia (HCC) Pulmonary hypertension, unspecified (HCC) ILD (interstitial lung disease) (HCC) -She is doing well, medication changes as noted.  She follows routinely with  rheumatology and pulmonary.  Vitamin D deficiency -Recheck vitamin D when she returns for physical.  Hypertension -Well-controlled on metoprolol.  GERD -Well-controlled on daily Protonix.  Atrial fibrillation -Rate controlled on beta-blocker, follows annually with Dr. Gala Romney.      Chaya Jan, MD [facility] at [facility]

## 2020-07-24 ENCOUNTER — Ambulatory Visit: Payer: Medicare PPO | Admitting: Internal Medicine

## 2020-08-19 DIAGNOSIS — I272 Pulmonary hypertension, unspecified: Secondary | ICD-10-CM | POA: Diagnosis not present

## 2020-08-19 DIAGNOSIS — J849 Interstitial pulmonary disease, unspecified: Secondary | ICD-10-CM | POA: Diagnosis not present

## 2020-09-16 ENCOUNTER — Telehealth: Payer: Self-pay | Admitting: Internal Medicine

## 2020-09-16 NOTE — Telephone Encounter (Signed)
Patient's daughter [name] is requesting that her mother's visit tomorrow be in person.  She has received her PCR Covid test results back and they are negative.  She would like Dr. Ardyth Harps to listen to her lungs and do some blood work.   She says that cough that she has is the same cough that she had 2 years ago. Her medicine was recently changes by her Rheumatologist.   Please advise.

## 2020-09-16 NOTE — Telephone Encounter (Signed)
Ok to see in office if she can present proof of a recent negative COVID PCR.

## 2020-09-17 ENCOUNTER — Encounter: Payer: Self-pay | Admitting: Internal Medicine

## 2020-09-17 ENCOUNTER — Ambulatory Visit: Payer: Medicare PPO | Admitting: Internal Medicine

## 2020-09-17 ENCOUNTER — Other Ambulatory Visit: Payer: Self-pay

## 2020-09-17 VITALS — BP 140/90 | HR 90 | Temp 98.3°F | Wt 159.0 lb

## 2020-09-17 DIAGNOSIS — H66002 Acute suppurative otitis media without spontaneous rupture of ear drum, left ear: Secondary | ICD-10-CM | POA: Diagnosis not present

## 2020-09-17 MED ORDER — AMOXICILLIN-POT CLAVULANATE 500-125 MG PO TABS: 1.0000 | ORAL_TABLET | Freq: Two times a day (BID) | ORAL | 0 refills | Status: AC

## 2020-09-17 NOTE — Progress Notes (Signed)
Acute office Visit     This visit occurred during the SARS-CoV-2 public health emergency.  Safety protocols were in place, including screening questions prior to the visit, additional usage of staff PPE, and extensive cleaning of exam room while observing appropriate contact time as indicated for disinfecting solutions.    CC/Reason for Visit: Left ear pain, left jaw pain, sore throat, sinus drainage.  HPI: Sabrina Douglas is a 80 y.o. female who is coming in today for the above mentioned reasons.  She has a past medical history significant for interstitial lung disease and is on mycophenolate.  For the past 2 weeks she describes a significantly sore throat, hoarse voice, hurting jaw and pain behind her ears especially worse on the left side, she uses oxygen at nighttime and has noticed some mild wheezing at times although she does not feel short of breath.  She denies fever.  She has no recent travel or known sick contacts, no known Covid exposures.  At my urging she did get a Covid PCR test that was negative on February 8.  She brings proof of that test today.   Past Medical/Surgical History: Past Medical History:  Diagnosis Date  . Anxiety   . Depression   . Hyperlipidemia     Past Surgical History:  Procedure Laterality Date  . BALLOON DILATION N/A 10/04/2018   Procedure: BALLOON DILATION;  Surgeon: Kathi Der, MD;  Location: [facility];  Service: Gastroenterology;  Laterality: N/A;  . BIOPSY  10/04/2018   Procedure: BIOPSY;  Surgeon: Kathi Der, MD;  Location: [facility];  Service: Gastroenterology;;  . ESOPHAGOGASTRODUODENOSCOPY (EGD) WITH PROPOFOL N/A 10/04/2018   Procedure: ESOPHAGOGASTRODUODENOSCOPY (EGD) WITH PROPOFOL;  Surgeon: Kathi Der, MD;  Location: [facility];  Service: Gastroenterology;  Laterality: N/A;  . RIGHT HEART CATH N/A 10/15/2018   Procedure: RIGHT HEART CATH;  Surgeon: Dolores Patty, MD;  [facility]: [facility];   Service: Cardiovascular;  Laterality: N/A;    Social History:  reports that she is a non-smoker but has been exposed to tobacco smoke. She has never used smokeless tobacco. She reports that she does not drink alcohol. No history on file for drug use.  Allergies: No Known Allergies  Family History:  Family History  Problem Relation Age of Onset  . Sudden death Mother 60       sudden death post hysterectomy  . Prostate cancer Father   . Diabetes Father      Current Outpatient Medications:  .  amoxicillin-clavulanate (AUGMENTIN) 500-125 MG tablet, Take 1 tablet (500 mg total) by mouth in the morning and at bedtime for 7 days., Disp: 14 tablet, Rfl: 0 .  albuterol (PROVENTIL HFA;VENTOLIN HFA) 108 (90 Base) MCG/ACT inhaler, Inhale 2 puffs into the lungs every 6 (six) hours as needed for wheezing or shortness of breath., Disp: 1 Inhaler, Rfl: 6 .  apixaban (ELIQUIS) 5 MG TABS tablet, TAKE 1 TABLET(5 MG) BY MOUTH TWICE DAILY, Disp: 60 tablet, Rfl: 11 .  furosemide (LASIX) 40 MG tablet, TAKE 1 TABLET BY MOUTH DAILY, Disp: 90 tablet, Rfl: 1 .  Glycerin-Hypromellose-PEG 400 0.2-0.2-1 % SOLN, Place 2 drops into both eyes as needed (Dry eyes)., Disp: , Rfl:  .  metoprolol tartrate (LOPRESSOR) 25 MG tablet, TAKE 1 TABLET(25 MG) BY MOUTH TWICE DAILY, Disp: 180 tablet, Rfl: 1 .  Multiple Vitamin (MULTIVITAMIN WITH MINERALS) TABS tablet, Take 1 tablet by mouth daily., Disp: , Rfl:  .  mycophenolate (CELLCEPT) 250 MG capsule,  Take 250 mg by mouth in the morning and at bedtime. (Patient not taking: Reported on 07/22/2020), Disp: , Rfl:  .  pantoprazole (PROTONIX) 40 MG tablet, TAKE 1 TABLET BY MOUTH EVERY MORNING, Disp: 30 tablet, Rfl: 6 .  Respiratory Therapy Supplies (FLUTTER) DEVI, 1 Device by Does not apply route as needed., Disp: 1 each, Rfl: 0  Review of Systems:  Constitutional: Denies fever, chills, diaphoresis, appetite change. HEENT: Denies photophobia, eye pain, redness, hearing loss, neck  pain, neck stiffness and tinnitus.   Respiratory: Denies SOB, DOE,  chest tightness,  and wheezing.   Cardiovascular: Denies chest pain, palpitations and leg swelling.  Gastrointestinal: Denies nausea, vomiting, abdominal pain, diarrhea, constipation, blood in stool and abdominal distention.  Genitourinary: Denies dysuria, urgency, frequency, hematuria, flank pain and difficulty urinating.  Endocrine: Denies: hot or cold intolerance, sweats, changes in hair or nails, polyuria, polydipsia. Musculoskeletal: Denies myalgias, back pain, joint swelling, arthralgias and gait problem.  Skin: Denies pallor, rash and wound.  Neurological: Denies dizziness, seizures, syncope, weakness, light-headedness, numbness and headaches.  Hematological: Denies adenopathy. Easy bruising, personal or family bleeding history  Psychiatric/Behavioral: Denies suicidal ideation, mood changes, confusion, nervousness, sleep disturbance and agitation    Physical Exam: Vitals:   09/17/20 1609  BP: 140/90  Pulse: 90  Temp: 98.3 F (36.8 C)  TempSrc: Oral  SpO2: 96%  Weight: 159 lb (72.1 kg)    Body mass index is 27.29 kg/m.   Constitutional: NAD, calm, comfortable Eyes: PERRL, lids and conjunctivae normal, wears corrective lenses ENMT: Mucous membranes are moist. Posterior pharynx clear of any exudate or lesions.  Significant pharyngeal erythema normal dentition. Tympanic membrane is pearly white, no erythema or bulging on the right, left is erythematous with air-fluid level. Neck: Anterior cervical chain lymphadenopathy. Respiratory: clear to auscultation bilaterally, no wheezing, no crackles. Normal respiratory effort. No accessory muscle use.  Cardiovascular: Regular rate and rhythm, no murmurs / rubs / gallops. No extremity edema. 2+ pedal pulses.   Neurologic: Grossly intact and nonfocal. Psychiatric: Normal judgment and insight. Alert and oriented x 3. Normal mood.    Impression and  Plan:  Non-recurrent acute suppurative otitis media of left ear without spontaneous rupture of tympanic membrane  - Plan: amoxicillin-clavulanate (AUGMENTIN) 500-125 MG tablet twice daily for 7 days, may also use OTC pain relievers, antihistamines, decongestants, cough suppressants.   Patient Instructions  -Nice seeing you today!!  -Start augmentin 1 tablet twice daily for 7 days.  -May also use mucinex D twice daily.   Otitis Media, Adult  Otitis media is a condition in which the middle ear is red and swollen (inflamed) and full of fluid. The middle ear is the part of the ear that contains bones for hearing as well as air that helps send sounds to the brain. The condition usually goes away on its own. What are the causes? This condition is caused by a blockage in the eustachian tube. The eustachian tube connects the middle ear to the back of the nose. It normally allows air into the middle ear. The blockage is caused by fluid or swelling. Problems that can cause blockage include:  A cold or infection that affects the nose, mouth, or throat.  Allergies.  An irritant, such as tobacco smoke.  Adenoids that have become large. The adenoids are soft tissue located in the back of the throat, behind the nose and the roof of the mouth.  Growth or swelling in the upper part of the throat, just behind the  nose (nasopharynx).  Damage to the ear caused by change in pressure. This is called barotrauma. What are the signs or symptoms? Symptoms of this condition include:  Ear pain.  Fever.  Problems with hearing.  Being tired.  Fluid leaking from the ear.  Ringing in the ear. How is this treated? This condition can go away on its own within 3-5 days. But if the condition is caused by bacteria or does not go away on its own, or if it keeps coming back, your doctor may:  Give you antibiotic medicines.  Give you medicines for pain. Follow these instructions at home:  Take  over-the-counter and prescription medicines only as told by your doctor.  If you were prescribed an antibiotic medicine, take it as told by your doctor. Do not stop taking the antibiotic even if you start to feel better.  Keep all follow-up visits as told by your doctor. This is important. Contact a doctor if:  You have bleeding from your nose.  There is a lump on your neck.  You are not feeling better in 5 days.  You feel worse instead of better. Get help right away if:  You have pain that is not helped with medicine.  You have swelling, redness, or pain around your ear.  You get a stiff neck.  You cannot move part of your face (paralysis).  You notice that the bone behind your ear hurts when you touch it.  You get a very bad headache. Summary  Otitis media means that the middle ear is red, swollen, and full of fluid.  This condition usually goes away on its own.  If the problem does not go away, treatment may be needed. You may be given medicines to treat the infection or to treat your pain.  If you were prescribed an antibiotic medicine, take it as told by your doctor. Do not stop taking the antibiotic even if you start to feel better.  Keep all follow-up visits as told by your doctor. This is important. This information is not intended to replace advice given to you by your health care provider. Make sure you discuss any questions you have with your health care provider. Document Revised: 06/27/2019 Document Reviewed: 06/27/2019 Elsevier Patient Education  2021 [employer]vier Inc.      Chaya Jan, MD [facility] at [facility]

## 2020-09-17 NOTE — Telephone Encounter (Signed)
Daughter is aware 

## 2020-09-17 NOTE — Patient Instructions (Signed)
-  Nice seeing you today!!  -Start augmentin 1 tablet twice daily for 7 days.  -May also use mucinex D twice daily.   Otitis Media, Adult  Otitis media is a condition in which the middle ear is red and swollen (inflamed) and full of fluid. The middle ear is the part of the ear that contains bones for hearing as well as air that helps send sounds to the brain. The condition usually goes away on its own. What are the causes? This condition is caused by a blockage in the eustachian tube. The eustachian tube connects the middle ear to the back of the nose. It normally allows air into the middle ear. The blockage is caused by fluid or swelling. Problems that can cause blockage include:  A cold or infection that affects the nose, mouth, or throat.  Allergies.  An irritant, such as tobacco smoke.  Adenoids that have become large. The adenoids are soft tissue located in the back of the throat, behind the nose and the roof of the mouth.  Growth or swelling in the upper part of the throat, just behind the nose (nasopharynx).  Damage to the ear caused by change in pressure. This is called barotrauma. What are the signs or symptoms? Symptoms of this condition include:  Ear pain.  Fever.  Problems with hearing.  Being tired.  Fluid leaking from the ear.  Ringing in the ear. How is this treated? This condition can go away on its own within 3-5 days. But if the condition is caused by bacteria or does not go away on its own, or if it keeps coming back, your doctor may:  Give you antibiotic medicines.  Give you medicines for pain. Follow these instructions at home:  Take over-the-counter and prescription medicines only as told by your doctor.  If you were prescribed an antibiotic medicine, take it as told by your doctor. Do not stop taking the antibiotic even if you start to feel better.  Keep all follow-up visits as told by your doctor. This is important. Contact a doctor if:  You  have bleeding from your nose.  There is a lump on your neck.  You are not feeling better in 5 days.  You feel worse instead of better. Get help right away if:  You have pain that is not helped with medicine.  You have swelling, redness, or pain around your ear.  You get a stiff neck.  You cannot move part of your face (paralysis).  You notice that the bone behind your ear hurts when you touch it.  You get a very bad headache. Summary  Otitis media means that the middle ear is red, swollen, and full of fluid.  This condition usually goes away on its own.  If the problem does not go away, treatment may be needed. You may be given medicines to treat the infection or to treat your pain.  If you were prescribed an antibiotic medicine, take it as told by your doctor. Do not stop taking the antibiotic even if you start to feel better.  Keep all follow-up visits as told by your doctor. This is important. This information is not intended to replace advice given to you by your health care provider. Make sure you discuss any questions you have with your health care provider. Document Revised: 06/27/2019 Document Reviewed: 06/27/2019 Elsevier Patient Education  2021 [employer].

## 2020-09-19 DIAGNOSIS — J849 Interstitial pulmonary disease, unspecified: Secondary | ICD-10-CM | POA: Diagnosis not present

## 2020-09-19 DIAGNOSIS — I272 Pulmonary hypertension, unspecified: Secondary | ICD-10-CM | POA: Diagnosis not present

## 2020-09-30 DIAGNOSIS — I7300 Raynaud's syndrome without gangrene: Secondary | ICD-10-CM | POA: Diagnosis not present

## 2020-09-30 DIAGNOSIS — Z6827 Body mass index (BMI) 27.0-27.9, adult: Secondary | ICD-10-CM | POA: Diagnosis not present

## 2020-09-30 DIAGNOSIS — I4891 Unspecified atrial fibrillation: Secondary | ICD-10-CM | POA: Diagnosis not present

## 2020-09-30 DIAGNOSIS — J849 Interstitial pulmonary disease, unspecified: Secondary | ICD-10-CM | POA: Diagnosis not present

## 2020-09-30 DIAGNOSIS — I272 Pulmonary hypertension, unspecified: Secondary | ICD-10-CM | POA: Diagnosis not present

## 2020-09-30 DIAGNOSIS — E663 Overweight: Secondary | ICD-10-CM | POA: Diagnosis not present

## 2020-09-30 DIAGNOSIS — M351 Other overlap syndromes: Secondary | ICD-10-CM | POA: Diagnosis not present

## 2020-10-09 ENCOUNTER — Encounter: Payer: Self-pay | Admitting: Family Medicine

## 2020-10-09 ENCOUNTER — Ambulatory Visit: Payer: Medicare PPO | Admitting: Family Medicine

## 2020-10-09 ENCOUNTER — Other Ambulatory Visit: Payer: Self-pay

## 2020-10-09 VITALS — BP 98/68 | HR 64 | Temp 97.7°F | Wt 158.2 lb

## 2020-10-09 DIAGNOSIS — I10 Essential (primary) hypertension: Secondary | ICD-10-CM

## 2020-10-09 DIAGNOSIS — R001 Bradycardia, unspecified: Secondary | ICD-10-CM

## 2020-10-09 DIAGNOSIS — I4891 Unspecified atrial fibrillation: Secondary | ICD-10-CM | POA: Diagnosis not present

## 2020-10-09 DIAGNOSIS — J9611 Chronic respiratory failure with hypoxia: Secondary | ICD-10-CM | POA: Diagnosis not present

## 2020-10-09 LAB — URINALYSIS, ROUTINE W REFLEX MICROSCOPIC
Bilirubin Urine: NEGATIVE
Ketones, ur: NEGATIVE
Nitrite: NEGATIVE
Specific Gravity, Urine: 1.01 (ref 1.000–1.030)
Total Protein, Urine: NEGATIVE
Urine Glucose: NEGATIVE
Urobilinogen, UA: 0.2 (ref 0.0–1.0)
pH: 7 (ref 5.0–8.0)

## 2020-10-09 LAB — CBC WITH DIFFERENTIAL/PLATELET
Basophils Absolute: 0 10*3/uL (ref 0.0–0.1)
Basophils Relative: 0.8 % (ref 0.0–3.0)
Eosinophils Absolute: 0 10*3/uL (ref 0.0–0.7)
Eosinophils Relative: 0.9 % (ref 0.0–5.0)
HCT: 39.2 % (ref 36.0–46.0)
Hemoglobin: 13.2 g/dL (ref 12.0–15.0)
Lymphocytes Relative: 22.6 % (ref 12.0–46.0)
Lymphs Abs: 0.9 10*3/uL (ref 0.7–4.0)
MCHC: 33.5 g/dL (ref 30.0–36.0)
MCV: 94.2 fl (ref 78.0–100.0)
Monocytes Absolute: 0.5 10*3/uL (ref 0.1–1.0)
Monocytes Relative: 12 % (ref 3.0–12.0)
Neutro Abs: 2.6 10*3/uL (ref 1.4–7.7)
Neutrophils Relative %: 63.7 % (ref 43.0–77.0)
Platelets: 320 10*3/uL (ref 150.0–400.0)
RBC: 4.16 Mil/uL (ref 3.87–5.11)
RDW: 12.5 % (ref 11.5–15.5)
WBC: 4.1 10*3/uL (ref 4.0–10.5)

## 2020-10-09 LAB — HEPATIC FUNCTION PANEL
ALT: 7 U/L (ref 0–35)
AST: 22 U/L (ref 0–37)
Albumin: 4.3 g/dL (ref 3.5–5.2)
Alkaline Phosphatase: 82 U/L (ref 39–117)
Bilirubin, Direct: 0.1 mg/dL (ref 0.0–0.3)
Total Bilirubin: 0.5 mg/dL (ref 0.2–1.2)
Total Protein: 7.9 g/dL (ref 6.0–8.3)

## 2020-10-09 LAB — BASIC METABOLIC PANEL
BUN: 11 mg/dL (ref 6–23)
CO2: 31 mEq/L (ref 19–32)
Calcium: 10 mg/dL (ref 8.4–10.5)
Chloride: 102 mEq/L (ref 96–112)
Creatinine, Ser: 0.83 mg/dL (ref 0.40–1.20)
GFR: 66.17 mL/min (ref >60.00)
Glucose, Bld: 95 mg/dL (ref 70–99)
Potassium: 4.3 mEq/L (ref 3.5–5.1)
Sodium: 141 mEq/L (ref 135–145)

## 2020-10-09 LAB — TSH: TSH: 2.47 u[IU]/mL (ref 0.35–4.50)

## 2020-10-09 NOTE — Progress Notes (Signed)
   Subjective:    Patient ID: Sabrina Douglas, female    DOB: 1941-06-17, 80 y.o.   MRN: [id number]  HPI Here with her daughter for several days of fatigue and slow heart rates. Her BP has been stable around the 120s over 60s, but her HR has been as low as the 40s. She denies any other specific symptoms, no chest pain or SOB. No cough or ST or abdominal symtoms. No fever. She has been taking 1/2 tablet (12.5 mg) of metoprolol tartrate BID. She had a negative Covid PCR test 3 days ago. She was recently treated for an otitis media with Augmentin and this has resolved.    Review of Systems     Objective:   Physical Exam Constitutional:      Appearance: Normal appearance. She is not ill-appearing.  HENT:     Right Ear: Tympanic membrane, ear canal and external ear normal.     Left Ear: Tympanic membrane, ear canal and external ear normal.     Nose: Nose normal.     Mouth/Throat:     Pharynx: Oropharynx is clear.  Eyes:     Conjunctiva/sclera: Conjunctivae normal.  Cardiovascular:     Rate and Rhythm: Normal rate and regular rhythm.     Pulses: Normal pulses.     Heart sounds: Normal heart sounds.     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     Breath sounds: Normal breath sounds.  Abdominal:     General: Abdomen is flat. Bowel sounds are normal. There is no distension.     Palpations: Abdomen is soft. There is no mass.     Tenderness: There is no abdominal tenderness. There is no guarding or rebound.     Hernia: No hernia is present.  Musculoskeletal:     Right lower leg: No edema.     Left lower leg: No edema.  Lymphadenopathy:     Cervical: No cervical adenopathy.  Neurological:     General: No focal deficit present.     Mental Status: She is alert and oriented to person, place, and time.           Assessment & Plan:  Recent onset of bradycardia. She will stop the Metoprolol for now.  We  will get labs today and refer her to Dr. Gala Romney soon.  Gershon Crane, MD

## 2020-10-11 NOTE — Progress Notes (Signed)
ADVANCED HF CLINIC NOTE  Date:  10/11/2020   ID:  Lakeria, Starkman March 05, 1941, MRN [id number]  Location: Home  Provider location: [facility] Type of Visit: Established patient  PCP:  Isaac Bliss, Rayford Halsted, MD  Cardiologist:  No primary care provider on file. Primary HF: Bensimhon  Chief Complaint: Heart Failure follow-up   History of Present Illness:  HPI: Sabrina Douglas is a 80 y.o. female with HTN, HL, anxiety/depression, and ILD due MCTD.   Patient seen for initial consult with Dr. [name] on 08/28/18. CXR showed mildly worsened bibasilar pneumonia, suggestive of atypical infection d/t waxing/weaning symptoms. 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  ANA positive, CCP negative. 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  Admitted 2/20 with severe weakness. Fount to havehyponatremia at114,dysphagia and about 20 pounds weight loss in the months. Treated with 3% saline and eventually demeclocycline.   Pt admitted 3/20 with new onset Afib. Converted back to NSR with amiodarone drip and was discontinued. Toprol resumed. Eliquis started. CT chest in 3/20 no coronary calcification. King 3/20 no PAH.   Follows with Dr. [name] & [name]. 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 Was on plaquenil/prednisone. Prednisone stopped in 10/21. Switched to Cellcept. Hires CT improving.   We saw her last in 6/21. C/o chest tightness. Coronary CT ordered but never completed because she did not want to take b-blocker.Clarnce Flock Dr. Sarajane Jews on Friday. Told him that she would see her HR go down to 40s occasionally on her pulse-ox. Feels like her heart skips beats at times. Had ECG at Dr. Barbie Banner office. HR not documented but daughter said it was fine. New RBBB (since 2020). No syncope or presyncope. No CP. Chronic SOB. Mild LE edema.  Says she is worried about her neck and swallowing. Feels like  her neck is getting thinner. Also having problems swallowing at times. Food does not get stuck. Feels anxious.    Studies:  HiRes CT 10/20 1. Bilateral lower lobe predominant bronchiectasis and volume loss may be postinfectious or post inflammatory in etiology. Findings are suggestive of an alternative diagnosis (not UIP) per consensus guidelines: Diagnosis of Idiopathic Pulmonary Fibrosis   Zio Patch 11/28/18  1. Predominant rhythm was sinus. Patient had a min HR of 54 bpm, max HR of 214 bpm, and avg HR of 96 bpm. 2. Two brief runs of NSVT - longest was 8 beats 3. Forty five brief runs of SVT. The run with the fastest interval lasting 5 beats with a max rate of 214 bpm, the longest lasting 15.7 secs with an avg rate of 154 bpm. 4. Rare PVCs   RHC 10/15/18 RA = 3 RV = 39/4 PA = 39/7 (18) PCW = 4 Fick cardiac output/index = 6.5/3.8 PVR = 2.1 WU Ao sat = 100% PA sat = 81%, 82% High SVC and axillary sats both 81%  Assessment: 1. Normal pulmonary pressures 2. High output physiology without evidence of intracardiac shunting   Labs  ANA1:1280(pos) ESR 104  RF 55(pos) RNP > 8.0(pos) AntiJo negative CCP < 16 Anti-Smith 2.0 (pos) Scl-70 < 1.0 ANCA negative  HRCT 09/10/18 1. 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 Repeat high-resolution chest CT is recommended in 12 months to assess for temporal changes in the appearance of the  lung parenchyma. 2. Aortic atherosclerosis.  Echo 09/30/18 1. The left ventricle has hyperdynamic systolic function, with an ejection fraction of >65%. The cavity size was normal. Left ventricular diastolic parameters were normal No evidence of left ventricular regional wall motion abnormalities. 2. No evidence of left ventricular regional wall motion abnormalities. 3. The right ventricle has normal systolic function. The cavity  was grossly normal. RA-RV peak gradient increased at 66.9 mmHg consistent with probable severe pulmonary hypertension. Could not estimate CVP however. 4. The aortic valve is tricuspid Mild aortic annular calcification noted. 5. The mitral valve is normal in structure. 6. The tricuspid valve is normal in structure. Tricuspid valve regurgitation is  PFTs (10/02/18) FEV1 1.13 (66%) FVC 1.23 (56%) DLCO 16%  VQ scan 10/03/18: Negative    Tehila Sokolow denies symptoms worrisome for COVID 19.   Past Medical History:  Diagnosis Date  . Anxiety   . Depression   . Hyperlipidemia    Past Surgical History:  Procedure Laterality Date  . BALLOON DILATION N/A 10/04/2018   Procedure: BALLOON DILATION;  Surgeon: Otis Brace, MD;  Location: [facility];  Service: Gastroenterology;  Laterality: N/A;  . BIOPSY  10/04/2018   Procedure: BIOPSY;  Surgeon: Otis Brace, MD;  Location: [facility];  Service: Gastroenterology;;  . ESOPHAGOGASTRODUODENOSCOPY (EGD) WITH PROPOFOL N/A 10/04/2018   Procedure: ESOPHAGOGASTRODUODENOSCOPY (EGD) WITH PROPOFOL;  Surgeon: Otis Brace, MD;  Location: [facility];  Service: Gastroenterology;  Laterality: N/A;  . RIGHT HEART CATH N/A 10/15/2018   Procedure: RIGHT HEART CATH;  Surgeon: Jolaine Artist, MD;  Location: [facility];  Service: Cardiovascular;  Laterality: N/A;     Current Outpatient Medications  Medication Sig Dispense Refill  . albuterol (PROVENTIL HFA;VENTOLIN HFA) 108 (90 Base) MCG/ACT inhaler Inhale 2 puffs into the lungs every 6 (six) hours as needed for wheezing or shortness of breath. 1 Inhaler 6  . apixaban (ELIQUIS) 5 MG TABS tablet TAKE 1 TABLET(5 MG) BY MOUTH TWICE DAILY 60 tablet 11  . furosemide (LASIX) 40 MG tablet TAKE 1 TABLET BY MOUTH DAILY 90 tablet 1  . Glycerin-Hypromellose-PEG 400 0.2-0.2-1 % SOLN Place 2 drops into both eyes as needed (Dry eyes).    . Multiple Vitamin (MULTIVITAMIN WITH  MINERALS) TABS tablet Take 1 tablet by mouth daily.    . mycophenolate (CELLCEPT) 250 MG capsule Take 250 mg by mouth in the morning and at bedtime.    . pantoprazole (PROTONIX) 40 MG tablet TAKE 1 TABLET BY MOUTH EVERY MORNING 30 tablet 6  . Respiratory Therapy Supplies (FLUTTER) DEVI 1 Device by Does not apply route as needed. 1 each 0   No current facility-administered medications for this encounter.    Allergies:   Patient has no known allergies.   Social History:  The patient  reports that she is a non-smoker but has been exposed to tobacco smoke. She has never used smokeless tobacco. She reports that she does not drink alcohol.   Family History:  The patient's family history includes Diabetes in her father; Prostate cancer in her father; Sudden death (age of onset: 73) in her mother.   ROS:  Please see the history of present illness.   All other systems are personally reviewed and negative.   Vitals:   10/12/20 1200  BP: 130/80  Pulse: 89  SpO2: 98%  Weight: 70.8 kg (156 lb)     Exam:   General:  Elderly woman/ mildly anxious  No resp difficulty HEENT: normal Neck: supple. no  JVD. Carotids 2+ bilat; no bruits. No lymphadenopathy or thryomegaly appreciated. Cor: PMI nondisplaced. Regular rate & rhythm. No rubs, gallops or murmurs. Lungs: minimal fine crackles  Abdomen: soft, nontender, nondistended. No hepatosplenomegaly. No bruits or masses. Good bowel sounds. Extremities: no cyanosis, clubbing, rash,  2+ R and 1+ L LEedema Neuro: alert & orientedx3, cranial nerves grossly intact. moves all 4 extremities w/o difficulty. Affect pleasant  ECG SR 81 with RBBB Personally reviewed  Recent Labs: 12/31/2019: Magnesium 2.3 10/09/2020: ALT 7; BUN 11; Creatinine, Ser 0.83; Hemoglobin 13.2; Platelets 320.0; Potassium 4.3; Sodium 141; TSH 2.47  Personally reviewed   Wt Readings from Last 3 Encounters:  10/09/20 71.8 kg (158 lb 3.2 oz)  09/17/20 72.1 kg (159 lb)  07/22/20 72.9 kg  (160 lb 11.2 oz)     ASSESSMENT AND PLAN:  1. Bradycardia - I think this is artifactual due to her monitor or possible PVCs/bigeminy - will repeat Zio - ok to hold b-blocker for now  2. New RBBB - unknown duration. likely due to aging of conduction system.    3. 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 [name] in Pulmonary - Much improved with steroids. - VQ is negative. - RHC 10/15/18 with normal pulmonary pressures and high output physiology without evidence of intracardiac shunting so not candidate for selective pulmonary vasodilators  - Now on CellCept per Dr. [name]. Dose being adjusted  5. Chronic hypoxic respiratory failure - likely due to ILD & less PAH - Much improved with treatment of MCTD. Only wears O2 at night now - Followed closely by Dr [name].  - continue current regimen  6. Afib, paroxysmal - In NSR today - Continue Eliquis 5 mg BID. Continue 5 bid If weigh < 60 kg or cr >= 1.5 will decrease dose.  - This patients CHA2DS2-VASc is 4.   7. Dysphagia - will refer to ENT.  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   Signed, Glori Bickers, MD  10/11/2020 7:20 PM  [facility] [address] [facility] [place] [address] [phone] (office) [phone] (fax)

## 2020-10-12 ENCOUNTER — Ambulatory Visit (HOSPITAL_COMMUNITY)
Admission: RE | Admit: 2020-10-12 | Discharge: 2020-10-12 | Disposition: A | Discharge status: Home / Self Care | Payer: Medicare PPO | Source: Ambulatory Visit | Attending: Internal Medicine | Admitting: Internal Medicine

## 2020-10-12 ENCOUNTER — Other Ambulatory Visit (HOSPITAL_COMMUNITY): Payer: Self-pay | Admitting: Internal Medicine

## 2020-10-12 ENCOUNTER — Encounter (HOSPITAL_COMMUNITY): Payer: Self-pay | Admitting: Internal Medicine

## 2020-10-12 ENCOUNTER — Encounter: Payer: Self-pay | Admitting: Family Medicine

## 2020-10-12 ENCOUNTER — Other Ambulatory Visit: Payer: Self-pay

## 2020-10-12 ENCOUNTER — Ambulatory Visit (HOSPITAL_BASED_OUTPATIENT_CLINIC_OR_DEPARTMENT_OTHER)
Admission: RE | Admit: 2020-10-12 | Discharge: 2020-10-12 | Disposition: A | Discharge status: Home / Self Care | Payer: Medicare PPO | Source: Ambulatory Visit | Attending: Internal Medicine | Admitting: Internal Medicine

## 2020-10-12 VITALS — BP 130/80 | HR 89 | Wt 156.0 lb

## 2020-10-12 DIAGNOSIS — M329 Systemic lupus erythematosus, unspecified: Secondary | ICD-10-CM | POA: Insufficient documentation

## 2020-10-12 DIAGNOSIS — R002 Palpitations: Secondary | ICD-10-CM

## 2020-10-12 DIAGNOSIS — R600 Localized edema: Secondary | ICD-10-CM

## 2020-10-12 DIAGNOSIS — M351 Other overlap syndromes: Secondary | ICD-10-CM | POA: Insufficient documentation

## 2020-10-12 DIAGNOSIS — J9611 Chronic respiratory failure with hypoxia: Secondary | ICD-10-CM | POA: Insufficient documentation

## 2020-10-12 DIAGNOSIS — Z7722 Contact with and (suspected) exposure to environmental tobacco smoke (acute) (chronic): Secondary | ICD-10-CM | POA: Insufficient documentation

## 2020-10-12 DIAGNOSIS — E785 Hyperlipidemia, unspecified: Secondary | ICD-10-CM | POA: Insufficient documentation

## 2020-10-12 DIAGNOSIS — I110 Hypertensive heart disease with heart failure: Secondary | ICD-10-CM | POA: Diagnosis not present

## 2020-10-12 DIAGNOSIS — R079 Chest pain, unspecified: Secondary | ICD-10-CM

## 2020-10-12 DIAGNOSIS — I480 Paroxysmal atrial fibrillation: Secondary | ICD-10-CM | POA: Insufficient documentation

## 2020-10-12 DIAGNOSIS — I4510 Unspecified right bundle-branch block: Secondary | ICD-10-CM

## 2020-10-12 DIAGNOSIS — R1310 Dysphagia, unspecified: Secondary | ICD-10-CM

## 2020-10-12 DIAGNOSIS — Z7901 Long term (current) use of anticoagulants: Secondary | ICD-10-CM | POA: Diagnosis not present

## 2020-10-12 DIAGNOSIS — F32A Depression, unspecified: Secondary | ICD-10-CM | POA: Diagnosis not present

## 2020-10-12 DIAGNOSIS — R609 Edema, unspecified: Secondary | ICD-10-CM | POA: Insufficient documentation

## 2020-10-12 DIAGNOSIS — J8410 Pulmonary fibrosis, unspecified: Secondary | ICD-10-CM | POA: Insufficient documentation

## 2020-10-12 DIAGNOSIS — Z79899 Other long term (current) drug therapy: Secondary | ICD-10-CM | POA: Insufficient documentation

## 2020-10-12 DIAGNOSIS — R001 Bradycardia, unspecified: Secondary | ICD-10-CM | POA: Diagnosis not present

## 2020-10-12 HISTORY — DX: Heart failure, unspecified: I50.9

## 2020-10-12 NOTE — Patient Instructions (Signed)
It was great to see you today! No medication changes are needed at this time.  You have been referred to ENT-Dr Ezzard Standing for further evaluation of dysphagia   Your physician has requested that you have a lower extremity arterial exercise duplex. During this test, exercise and ultrasound are used to evaluate arterial blood flow in the legs. Allow one hour for this exam. There are no restrictions or special instructions.  Your provider has recommended that  you wear a Zio Patch for 14 days.  This monitor will record your heart rhythm for our review.  IF you have any symptoms while wearing the monitor please press the button.  If you have any issues with the patch or you notice a red or orange light on it please call the company at [phone].  Once you remove the patch please mail it back to the company as soon as possible so we can get the results.  Do the following things EVERYDAY: 1) Weigh yourself in the morning before breakfast. Write it down and keep it in a log. 2) Take your medicines as prescribed 3) Eat low salt foods--Limit salt (sodium) to 2000 mg per day.  4) Stay as active as you can everyday 5) Limit all fluids for the day to less than 2 liters  At the [facility], you and your health needs are our priority. As part of our continuing mission to provide you with exceptional heart care, we have created designated Provider Care Teams. These Care Teams include your primary Cardiologist (physician) and Advanced Practice Providers (APPs- Physician Assistants and Nurse Practitioners) who all work together to provide you with the care you need, when you need it.   You may see any of the following providers on your designated Care Team at your next follow up: Marland Kitchen Dr Arvilla Meres . Dr Marca Ancona . Dr Thornell Mule . Tonye Becket, NP . Robbie Lis, PA . Shanda Bumps [place],NP . Karle Plumber, PharmD   Please be sure to bring in all your medications bottles to  every appointment.

## 2020-10-12 NOTE — Telephone Encounter (Signed)
No I do not think she has a UTI, she is likely a bit dehydrated. Make sure she is drinking plenty of water

## 2020-10-12 NOTE — Progress Notes (Signed)
Bilateral lower extremity venous duplex has been completed. Preliminary results can be found in CV Proc through chart review.   10/12/20 4:24 PM Olen Cordial RVT

## 2020-10-14 ENCOUNTER — Encounter: Payer: Self-pay | Admitting: Internal Medicine

## 2020-10-15 ENCOUNTER — Ambulatory Visit: Payer: Medicare PPO | Admitting: Internal Medicine

## 2020-10-17 DIAGNOSIS — J849 Interstitial pulmonary disease, unspecified: Secondary | ICD-10-CM | POA: Diagnosis not present

## 2020-10-17 DIAGNOSIS — I272 Pulmonary hypertension, unspecified: Secondary | ICD-10-CM | POA: Diagnosis not present

## 2020-10-29 ENCOUNTER — Ambulatory Visit (INDEPENDENT_AMBULATORY_CARE_PROVIDER_SITE_OTHER): Payer: Medicare PPO | Admitting: Otolaryngology

## 2020-11-02 DIAGNOSIS — R002 Palpitations: Secondary | ICD-10-CM | POA: Diagnosis not present

## 2020-11-06 ENCOUNTER — Ambulatory Visit (INDEPENDENT_AMBULATORY_CARE_PROVIDER_SITE_OTHER): Payer: Medicare PPO | Admitting: Otolaryngology

## 2020-11-06 ENCOUNTER — Other Ambulatory Visit: Payer: Self-pay

## 2020-11-06 VITALS — Temp 97.3°F

## 2020-11-06 DIAGNOSIS — K219 Gastro-esophageal reflux disease without esophagitis: Secondary | ICD-10-CM

## 2020-11-06 DIAGNOSIS — R490 Dysphonia: Secondary | ICD-10-CM

## 2020-11-06 DIAGNOSIS — J310 Chronic rhinitis: Secondary | ICD-10-CM | POA: Diagnosis not present

## 2020-11-06 NOTE — Progress Notes (Signed)
HPI: Sabrina Douglas is a 80 y.o. female who presents is referred by  Dr Gala Romney for evaluation of multiple complaints.  She apparently has had history of interstitial lung disease and is followed by pulmonary.  She had cardiac problems as well as chronic swallowing problems.  She has had barium swallow as well as upper endoscopy.  She is presently on pantoprazole 40 mg in the morning. She also complains of intermittent pain in her jaw and neck.  She also complains of ringing in her ears and intermittent noise in her ears. She complains of feeling like something is stuck in her throat and points down to the lower throat and midline. Although she complains of being hoarse she really does not sound that hoarse to me in the office.. She presents today with her daughter.  Past Medical History:  Diagnosis Date  . Anxiety   . CHF (congestive heart failure) (HCC)   . Depression   . Hyperlipidemia    Past Surgical History:  Procedure Laterality Date  . BALLOON DILATION N/A 10/04/2018   Procedure: BALLOON DILATION;  Surgeon: Kathi Der, MD;  Location: [facility];  Service: Gastroenterology;  Laterality: N/A;  . BIOPSY  10/04/2018   Procedure: BIOPSY;  Surgeon: Kathi Der, MD;  Location: [facility];  Service: Gastroenterology;;  . ESOPHAGOGASTRODUODENOSCOPY (EGD) WITH PROPOFOL N/A 10/04/2018   Procedure: ESOPHAGOGASTRODUODENOSCOPY (EGD) WITH PROPOFOL;  Surgeon: Kathi Der, MD;  Location: [facility];  Service: Gastroenterology;  Laterality: N/A;  . RIGHT HEART CATH N/A 10/15/2018   Procedure: RIGHT HEART CATH;  Surgeon: Dolores Patty, MD;  Location: [facility];  Service: Cardiovascular;  Laterality: N/A;   Social History   Socioeconomic History  . Marital status: Widowed    Spouse name: Not on file  . Number of children: Not on file  . Years of education: Not on file  . Highest education level: Not on file  Occupational History  . Not on file   Tobacco Use  . Smoking status: Passive Smoke Exposure - Never Smoker  . Smokeless tobacco: Never Used  Vaping Use  . Vaping Use: Never used  Substance and Sexual Activity  . Alcohol use: Never  . Drug use: Not on file  . Sexual activity: Not on file  Other Topics Concern  . Not on file  Social History Narrative  . Not on file   Social Determinants of Health   Financial Resource Strain: Not on file  Food Insecurity: Not on file  Transportation Needs: Not on file  Physical Activity: Not on file  Stress: Not on file  Social Connections: Not on file   Family History  Problem Relation Age of Onset  . Sudden death Mother 25       sudden death post hysterectomy  . Prostate cancer Father   . Diabetes Father    No Known Allergies Prior to Admission medications   Medication Sig Start Date End Date Taking? Authorizing Provider  albuterol (PROVENTIL HFA;VENTOLIN HFA) 108 (90 Base) MCG/ACT inhaler Inhale 2 puffs into the lungs every 6 (six) hours as needed for wheezing or shortness of breath. 09/18/18   Glenford Bayley, NP  apixaban (ELIQUIS) 5 MG TABS tablet TAKE 1 TABLET(5 MG) BY MOUTH TWICE DAILY 12/19/19   Bensimhon, Bevelyn Buckles, MD  Cholecalciferol (VITAMIN D HIGH POTENCY) 25 MCG (1000 UT) capsule Take 1,000 Units by mouth daily.    [provider]  furosemide (LASIX) 40 MG tablet TAKE 1 TABLET BY MOUTH DAILY  06/04/20   Henderson Cloud, MD  Glycerin-Hypromellose-PEG 400 0.2-0.2-1 % SOLN Place 2 drops into both eyes as needed (Dry eyes).    [provider]  Multiple Vitamin (MULTIVITAMIN WITH MINERALS) TABS tablet Take 1 tablet by mouth daily.    [provider]  mycophenolate (CELLCEPT) 250 MG capsule Take 250 mg by mouth in the morning and at bedtime. 07/08/20   [provider]  pantoprazole (PROTONIX) 40 MG tablet TAKE 1 TABLET BY MOUTH EVERY MORNING 12/12/19   Bensimhon, Bevelyn Buckles, MD  Respiratory Therapy Supplies (FLUTTER) DEVI 1 Device  by Does not apply route as needed. 09/19/18   Glenford Bayley, NP     Positive ROS: Otherwise negative  All other systems have been reviewed and were otherwise negative with the exception of those mentioned in the HPI and as above.  Physical Exam: Constitutional: Alert, well-appearing, no acute distress.  She has no significant hoarseness in the office today although she says it comes and goes.  She also complains of a weak voice. Ears: External ears without lesions or tenderness. Ear canals are clear bilaterally with normal-appearing TMs bilaterally.  On hearing screening with a 512 1024 tuning fork she has a mild hearing loss but hearing is symmetric with AC > BC bilaterally. Nasal: External nose without lesions. Septum relatively midline with clear nasal passages bilaterally and mild rhinitis.  Both millimeters regions are clear with no signs of infection.  On nasal endoscopy the nasopharynx was clear.  With just clear mucus discharge which was minimal.  No signs of infection as both middle meatus regions are clear.  There are no polyps..  Oral: Lips and gums without lesions. Tongue and palate mucosa without lesions. Posterior oropharynx clear. Fiberoptic laryngoscopy was performed to the left nostril.  The nasopharynx was clear.  The base of tongue vallecula epiglottis were normal.  Vocal cords were clear bilaterally with normal vocal mobility.  Piriform sinuses were clear.  She had mild edema of the arytenoid mucosa consistent with probable reflux type symptoms. Neck: No palpable adenopathy or masses Respiratory: Breathing comfortably  Skin: No facial/neck lesions or rash noted.  Laryngoscopy  Date/Time: 11/06/2020 4:57 PM Performed by: Drema Halon, MD Authorized by: Drema Halon, MD   Consent:    Consent obtained:  Verbal   Consent given by:  Patient Procedure details:    Indications: hoarseness, dysphagia, or aspiration     Medication:  Afrin   Instrument:  flexible fiberoptic laryngoscope     Scope location: left nare   Sinus:    Left middle meatus: normal     Left nasopharynx: normal   Mouth:    Oropharynx: normal     Vallecula: normal     Base of tongue: normal     Epiglottis: normal   Throat:    Pyriform sinus: normal     True vocal cords: normal   Comments:     On fiberoptic laryngoscopy the hypopharynx and larynx was clear with no evidence of neoplasm or infection.  She did have mild arytenoid edema consistent with probable repeat reflux type symptoms.    Assessment: Suspect she has reflux with globus type symptoms causing sensation of mucus in her throat and fullness in her throat.  She has normal exam on fiberoptic laryngoscopy otherwise. Concerning her voice she has interstitial lung disease and I think is a weak overall that contributes some to her complaints of voice. She has no clinical evidence of infection.  Plan:  I discussed with her and her daughter concerning use of nasal steroid spray which she has used in the past as well as saline rinses will help some with excessive postnasal drainage. Suggested taking the pantoprazole before dinner instead of first thing in the morning as I suspect she probably has more reflux at night when she is sleeping and this will provide better coverage during that time. Reassured patient as well as her daughter concerning normal upper airway examination with no laryngeal abnormalities.   Narda Bonds, MD   CC:

## 2020-11-10 ENCOUNTER — Other Ambulatory Visit: Payer: Self-pay

## 2020-11-10 ENCOUNTER — Ambulatory Visit: Payer: Medicare PPO | Admitting: Pulmonary Disease

## 2020-11-10 ENCOUNTER — Encounter: Payer: Self-pay | Admitting: Pulmonary Disease

## 2020-11-10 VITALS — BP 120/74 | HR 88 | Temp 97.3°F | Ht 64.0 in | Wt 157.4 lb

## 2020-11-10 DIAGNOSIS — J9611 Chronic respiratory failure with hypoxia: Secondary | ICD-10-CM

## 2020-11-10 DIAGNOSIS — M351 Other overlap syndromes: Secondary | ICD-10-CM | POA: Diagnosis not present

## 2020-11-10 DIAGNOSIS — J849 Interstitial pulmonary disease, unspecified: Secondary | ICD-10-CM | POA: Diagnosis not present

## 2020-11-10 NOTE — Assessment & Plan Note (Signed)
She can continue to use oxygen during sleep

## 2020-11-10 NOTE — Assessment & Plan Note (Signed)
We will reassess with high-resolution CT chest and compare with previous to see if ILD is worse. If so we will obtain PFTs although she is hesitant to do this. She does not desaturate on walking which is reassuring

## 2020-11-10 NOTE — Progress Notes (Signed)
   Subjective:    Patient ID: Sabrina Douglas, female    DOB: Jan 05, 1941, 80 y.o.   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   SeesDr. Beekman-initiallystarted on Plaquenil and 10 mg of prednisone,gradually tapered to off 02/2020   Chief Complaint  Patient presents with  . Follow-up    Irregular heartbeat, abnormal ECG, wore holter monitor   05/2020 started on CellCept , was on Plaquenil and prednisone earlier Reviewed cardiology consultation 10/2020 -new onset RBBB noted on EKG -felt to be conduction disease, Zio patch repeated , placed on Eliquis for paroxysmal A. Fib Referred to ENT for dysphagia >> take Protonix before dinner  Admits to sedentary lifestyle, accompanied by her daughter today who provides additional history they are concerned about side effects of CellCept Reviewed Zio patch results which showed few episodes of SVT and second-degree AV nodal block but no episodes of atrial fibrillation   Significant tests/ events reviewed  PFTs 04/2020 -mild restriction, ratio 85, FEV1 92%, FVC 84%, TLC 65%, DLCO 62% >> much improved compared to prior   ANA positive1: 1280,Sm Ab 2.0, RNP > 8.0 ,CCP negative  10/02/2018 PFTs ratio 92, moderate to severe restriction FEV1 66%, FVC 56%, TLC 46%, DLCO 17% corrects to 51% for alveolar volume  Echo 2/2020RVSP 67 Cath 3/2020RA 3, PA 39/7 , CI 3.8 PVR2.1 Wu >> normal pulmonary pressures, high-output physiology  HRCT 10/2020Bilateral lower lobe predominant bronchiectasis and volume lossmay be postinfectious or post inflammatory in etiology 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 Also denies presyncope, palpitations, heartburn, abdominal pain, nausea, vomiting, diarrhea or change in bowel or urinary habits, dysuria,hematuria, rash, arthralgias, visual complaints, headache, numbness weakness or ataxia.     Objective:   Physical Exam  Gen. Pleasant, obese, in no distress ENT - no lesions, no post nasal drip Neck: No JVD, no thyromegaly, no carotid bruits Lungs: no use of accessory muscles, no dullness to percussion, RT basal rales or rhonchi  Cardiovascular: Rhythm regular, heart sounds  normal, no murmurs or gallops, no peripheral edema Musculoskeletal: No deformities, no cyanosis or clubbing , no tremors       Assessment & Plan:

## 2020-11-10 NOTE — Patient Instructions (Signed)
  HRCT chest  Ambulatory sat OK to use oxygen during sleep

## 2020-11-10 NOTE — Assessment & Plan Note (Signed)
It does seem that she will need some anti-inflammatory medicine, prednisone and Plaquenil combination was not adequate and she was changed to CellCept.  Defer to rheumatology whether she needs increased dose her current dose would suffice. I reassured patient and her daughter that cardiac side effects were not related to CellCept

## 2020-11-17 ENCOUNTER — Encounter (HOSPITAL_COMMUNITY): Payer: Self-pay

## 2020-11-17 DIAGNOSIS — J849 Interstitial pulmonary disease, unspecified: Secondary | ICD-10-CM | POA: Diagnosis not present

## 2020-11-17 DIAGNOSIS — I272 Pulmonary hypertension, unspecified: Secondary | ICD-10-CM | POA: Diagnosis not present

## 2020-11-19 ENCOUNTER — Ambulatory Visit (INDEPENDENT_AMBULATORY_CARE_PROVIDER_SITE_OTHER): Payer: Medicare PPO | Admitting: Otolaryngology

## 2020-11-23 ENCOUNTER — Ambulatory Visit (INDEPENDENT_AMBULATORY_CARE_PROVIDER_SITE_OTHER)
Admission: RE | Admit: 2020-11-23 | Discharge: 2020-11-23 | Disposition: A | Discharge status: Home / Self Care | Payer: Medicare PPO | Source: Ambulatory Visit | Attending: Pulmonary Disease | Admitting: Pulmonary Disease

## 2020-11-23 ENCOUNTER — Other Ambulatory Visit: Payer: Self-pay

## 2020-11-23 DIAGNOSIS — R0602 Shortness of breath: Secondary | ICD-10-CM | POA: Diagnosis not present

## 2020-11-23 DIAGNOSIS — J849 Interstitial pulmonary disease, unspecified: Secondary | ICD-10-CM | POA: Diagnosis not present

## 2020-12-15 ENCOUNTER — Other Ambulatory Visit: Payer: Self-pay | Admitting: Internal Medicine

## 2020-12-16 ENCOUNTER — Other Ambulatory Visit (HOSPITAL_COMMUNITY): Payer: Self-pay

## 2020-12-16 MED ORDER — ELIQUIS 5 MG PO TABS: ORAL_TABLET | ORAL | 11 refills | Status: DC

## 2020-12-17 DIAGNOSIS — I272 Pulmonary hypertension, unspecified: Secondary | ICD-10-CM | POA: Diagnosis not present

## 2020-12-17 DIAGNOSIS — J849 Interstitial pulmonary disease, unspecified: Secondary | ICD-10-CM | POA: Diagnosis not present

## 2020-12-24 ENCOUNTER — Encounter (HOSPITAL_COMMUNITY): Payer: Self-pay | Admitting: Internal Medicine

## 2020-12-24 ENCOUNTER — Ambulatory Visit (HOSPITAL_COMMUNITY)
Admission: RE | Admit: 2020-12-24 | Discharge: 2020-12-24 | Disposition: A | Discharge status: Home / Self Care | Payer: Medicare PPO | Source: Ambulatory Visit | Attending: Internal Medicine | Admitting: Internal Medicine

## 2020-12-24 ENCOUNTER — Other Ambulatory Visit: Payer: Self-pay

## 2020-12-24 VITALS — BP 112/70 | HR 77 | Wt 159.6 lb

## 2020-12-24 DIAGNOSIS — Z79899 Other long term (current) drug therapy: Secondary | ICD-10-CM | POA: Insufficient documentation

## 2020-12-24 DIAGNOSIS — I700 Atherosclerosis of aorta: Secondary | ICD-10-CM | POA: Diagnosis not present

## 2020-12-24 DIAGNOSIS — M351 Other overlap syndromes: Secondary | ICD-10-CM

## 2020-12-24 DIAGNOSIS — I071 Rheumatic tricuspid insufficiency: Secondary | ICD-10-CM | POA: Insufficient documentation

## 2020-12-24 DIAGNOSIS — R600 Localized edema: Secondary | ICD-10-CM

## 2020-12-24 DIAGNOSIS — E785 Hyperlipidemia, unspecified: Secondary | ICD-10-CM | POA: Diagnosis not present

## 2020-12-24 DIAGNOSIS — J9611 Chronic respiratory failure with hypoxia: Secondary | ICD-10-CM | POA: Insufficient documentation

## 2020-12-24 DIAGNOSIS — R002 Palpitations: Secondary | ICD-10-CM | POA: Diagnosis not present

## 2020-12-24 DIAGNOSIS — Z7901 Long term (current) use of anticoagulants: Secondary | ICD-10-CM | POA: Diagnosis not present

## 2020-12-24 DIAGNOSIS — J8410 Pulmonary fibrosis, unspecified: Secondary | ICD-10-CM | POA: Insufficient documentation

## 2020-12-24 DIAGNOSIS — I480 Paroxysmal atrial fibrillation: Secondary | ICD-10-CM | POA: Diagnosis not present

## 2020-12-24 DIAGNOSIS — M349 Systemic sclerosis, unspecified: Secondary | ICD-10-CM | POA: Diagnosis not present

## 2020-12-24 DIAGNOSIS — R001 Bradycardia, unspecified: Secondary | ICD-10-CM | POA: Insufficient documentation

## 2020-12-24 DIAGNOSIS — I4510 Unspecified right bundle-branch block: Secondary | ICD-10-CM | POA: Diagnosis not present

## 2020-12-24 DIAGNOSIS — I509 Heart failure, unspecified: Secondary | ICD-10-CM | POA: Diagnosis not present

## 2020-12-24 DIAGNOSIS — I110 Hypertensive heart disease with heart failure: Secondary | ICD-10-CM | POA: Diagnosis not present

## 2020-12-24 NOTE — Addendum Note (Signed)
Encounter addended by: Noralee Space, RN on: 12/24/2020 12:15 PM  Actions taken: Order list changed, Clinical Note Signed

## 2020-12-24 NOTE — Progress Notes (Signed)
ADVANCED HF CLINIC NOTE  Date:  12/24/2020   ID:  Sabrina Douglas, DOB [birth year], MRN [id number]  Location: Home  Provider location: [facility] Type of Visit: Established patient  PCP:  Sabrina Douglas, Sabrina Halsted, MD  Cardiologist:  None Primary HF: Terrian Ridlon  Chief Complaint: Heart Failure follow-up   History of Present Illness:  HPI: Sabrina Douglas is a 80 y.o. female with HTN, HL, anxiety/depression, and ILD due MCTD.   Patient seen for initial consult with Dr. [name] on 08/28/18. CXR showed mildly worsened bibasilar pneumonia, suggestive of atypical infection d/t waxing/weaning symptoms. 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  ANA positive, CCP negative. 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  Admitted 2/20 with severe weakness. Fount to havehyponatremia at114,dysphagia and about 20 pounds weight loss in the months. Treated with 3% saline and eventually demeclocycline.   Pt admitted 3/20 with new onset Afib. Converted back to NSR with amiodarone drip and was discontinued. Toprol resumed. Eliquis started. CT chest in 3/20 no coronary calcification. [place] 3/20 no PAH.   Follows with Dr. [name] & [name]. 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 Was on plaquenil/prednisone. Prednisone stopped in 10/21. Switched to Cellcept. Hires CT improving.   C/o chest tightness. Coronary CT ordered but never completed because she did not want to take b-blocker.Marland Kitchen   Here with her daughter. Saw Dr [name] recently and started on mycophenalate. Titrated up to 1000 but had palpitations. Now taking 500/250 and tolerating well. Occasional heart flutters. No dizziness or presyncope. Occasional edema. Can do ADLs without too much problem.    Zio 10/12/20 1. Sinus rhythm -  avg HR of 78 bpm.  2. Bundle Branch Block/IVCD was present.  3. 16 runs of SVT occurred, the run with the fastest  interval lasting 7 beats with a max rate of 190 bpm, the longest lasting 8 beats with an avg rate of 114 bpm. 4. 708 episode(s) of brief nocturnal AV Block (2nd Mobitz II) occurred,  5. Rare PACs and PVCs 6. Nearly all patient-triggered events associated with sinus rhythm    Studies:  HiRes CT 10/20 1. Bilateral lower lobe predominant bronchiectasis and volume loss may be postinfectious or post inflammatory in etiology. Findings are suggestive of an alternative diagnosis (not UIP) per consensus guidelines: Diagnosis of Idiopathic Pulmonary Fibrosis   Zio Patch 11/28/18  1. Predominant rhythm was sinus. Patient had a min HR of 54 bpm, max HR of 214 bpm, and avg HR of 96 bpm. 2. Two brief runs of NSVT - longest was 8 beats 3. Forty five brief runs of SVT. The run with the fastest interval lasting 5 beats with a max rate of 214 bpm, the longest lasting 15.7 secs with an avg rate of 154 bpm. 4. Rare PVCs   RHC 10/15/18 RA = 3 RV = 39/4 PA = 39/7 (18) PCW = 4 Fick cardiac output/index = 6.5/3.8 PVR = 2.1 WU Ao sat = 100% PA sat = 81%, 82% High SVC and axillary sats both 81%  Assessment: 1. Normal pulmonary pressures 2. High output physiology without evidence of intracardiac shunting   Labs  ANA1:1280(pos) ESR 104  RF 55(pos) RNP > 8.0(pos) AntiJo negative CCP < 16 Anti-Smith 2.0 (pos) Scl-70 < 1.0 ANCA negative  HRCT 09/10/18 1. 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 Repeat high-resolution chest CT is recommended in 12 months to assess for temporal changes in the appearance of the lung parenchyma. 2. Aortic atherosclerosis.  Echo 09/30/18 1. The left ventricle has hyperdynamic systolic function, with an ejection fraction of >65%. The cavity size was normal. Left ventricular diastolic parameters were normal No evidence of left ventricular regional  wall motion abnormalities. 2. No evidence of left ventricular regional wall motion abnormalities. 3. The right ventricle has normal systolic function. The cavity was grossly normal. RA-RV peak gradient increased at 66.9 mmHg consistent with probable severe pulmonary hypertension. Could not estimate CVP however. 4. The aortic valve is tricuspid Mild aortic annular calcification noted. 5. The mitral valve is normal in structure. 6. The tricuspid valve is normal in structure. Tricuspid valve regurgitation is  PFTs (10/02/18) FEV1 1.13 (66%) FVC 1.23 (56%) DLCO 16%  VQ scan 10/03/18: Negative    Azure Budnick denies symptoms worrisome for COVID 19.   Past Medical History:  Diagnosis Date  . Anxiety   . CHF (congestive heart failure) ([place])   . Depression   . Hyperlipidemia    Past Surgical History:  Procedure Laterality Date  . BALLOON DILATION N/A 10/04/2018   Procedure: BALLOON DILATION;  Surgeon: Otis Brace, MD;  Location: [facility];  Service: Gastroenterology;  Laterality: N/A;  . BIOPSY  10/04/2018   Procedure: BIOPSY;  Surgeon: Otis Brace, MD;  Location: [facility];  Service: Gastroenterology;;  . ESOPHAGOGASTRODUODENOSCOPY (EGD) WITH PROPOFOL N/A 10/04/2018   Procedure: ESOPHAGOGASTRODUODENOSCOPY (EGD) WITH PROPOFOL;  Surgeon: Otis Brace, MD;  Location: [facility];  Service: Gastroenterology;  Laterality: N/A;  . RIGHT HEART CATH N/A 10/15/2018   Procedure: RIGHT HEART CATH;  Surgeon: Jolaine Artist, MD;  Location: [facility];  Service: Cardiovascular;  Laterality: N/A;     Current Outpatient Medications  Medication Sig Dispense Refill  . albuterol (PROVENTIL HFA;VENTOLIN HFA) 108 (90 Base) MCG/ACT inhaler Inhale 2 puffs into the lungs every 6 (six) hours as needed for wheezing or shortness of breath. 1 Inhaler 6  . apixaban (ELIQUIS) 5 MG TABS tablet TAKE 1 TABLET(5 MG) BY MOUTH TWICE DAILY 60 tablet 11  .  Cholecalciferol (VITAMIN D HIGH POTENCY) 25 MCG (1000 UT) capsule Take 1,000 Units by mouth daily.    . furosemide (LASIX) 40 MG tablet TAKE 1 TABLET BY MOUTH DAILY 90 tablet 1  . Glycerin-Hypromellose-PEG 400 0.2-0.2-1 % SOLN Place 2 drops into both eyes as needed (Dry eyes).    . Multiple Vitamin (MULTIVITAMIN WITH MINERALS) TABS tablet Take 1 tablet by mouth daily.    . mycophenolate (CELLCEPT) 500 MG tablet Take 500 mg by mouth in the morning. 250 mg in the evening    . pantoprazole (PROTONIX) 40 MG tablet TAKE 1 TABLET BY MOUTH EVERY MORNING 30 tablet 6  . Respiratory Therapy Supplies (FLUTTER) DEVI 1 Device by Does not apply route as needed. 1 each 0   No current facility-administered medications for this encounter.    Allergies:   Patient has no known allergies.   Social History:  The patient  reports that she is a non-smoker but has been exposed to tobacco smoke. She has never used smokeless tobacco. She reports that she does not drink alcohol.   Family History:  The patient's family history includes Diabetes in her father; Prostate cancer in her father; Sudden death (age of onset: 77) in her mother.   ROS:  Please see the history of present  illness.   All other systems are personally reviewed and negative.   Vitals:   12/24/20 1133  BP: 112/70  Pulse: 77  SpO2: 98%  Weight: 72.4 kg (159 lb 9.6 oz)     Exam:   General:  Elderly woman No resp difficulty HEENT: normal Neck: supple. no JVD. Carotids 2+ bilat; no bruits. No lymphadenopathy or thryomegaly appreciated. Cor: PMI nondisplaced. Regular rate & rhythm. No rubs, gallops or murmurs. Lungs: clear Abdomen: soft, nontender, nondistended. No hepatosplenomegaly. No bruits or masses. Good bowel sounds. Extremities: no cyanosis, clubbing, rash, edema Neuro: alert & orientedx3, cranial nerves grossly intact. moves all 4 extremities w/o difficulty. Affect pleasant   Recent Labs: 12/31/2019: Magnesium 2.3 10/09/2020: ALT 7;  BUN 11; Creatinine, Ser 0.83; Hemoglobin 13.2; Platelets 320.0; Potassium 4.3; Sodium 141; TSH 2.47  Personally reviewed   Wt Readings from Last 3 Encounters:  12/24/20 72.4 kg (159 lb 9.6 oz)  11/10/20 71.4 kg (157 lb 6.4 oz)  10/12/20 70.8 kg (156 lb)     ASSESSMENT AND PLAN:  1. Bradycardia - has brief nocturnal Mobitz II block but nos syncope.  - no clear indication for PPM now. Will d/w EP  - she would like to avoid PPM if possible   2. RBBB - stable  3. 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 [name] in Pulmonary - Much improved with steroids. - VQ is negative. - RHC 10/15/18 with normal pulmonary pressures and high output physiology without evidence of intracardiac shunting so not candidate for selective pulmonary vasodilators  - Now on CellCept per Dr. [name]. Dose being adjusted. Doubt the higher dose of CellCept will be associated with worsening bradycardia   5. Chronic hypoxic respiratory failure - likely due to ILD & less PAH - Much improved with treatment of MCTD. Only wears O2 at night now - Followed closely by Dr [name]. Last hi-res CT 4/22 slightly worse ILD  - continue current regimen  6. Afib, paroxysmal/PSVT - In NSR today. No AF on recent Zio - Continue Eliquis 5 mg BID. Continue 5 bid If weigh < 60 kg or cr >= 1.5 will decrease dose.  - This patients CHA2DS2-VASc is 4.     Glori Bickers, MD  12/24/2020 11:51 AM  [facility] [place] [address] [phone] (office) [phone] (fax)

## 2020-12-24 NOTE — Patient Instructions (Signed)
Continue current medications  Your physician recommends that you schedule a follow-up appointment in: 4 months  If you have any questions or concerns before your next appointment please send [place] a message through [place] or call our office at [phone].    TO LEAVE A MESSAGE FOR THE NURSE SELECT OPTION 2, PLEASE LEAVE A MESSAGE INCLUDING: . YOUR NAME . DATE OF BIRTH . CALL BACK NUMBER . REASON FOR CALL**this is important as we prioritize the call backs  YOU WILL RECEIVE A CALL BACK THE SAME DAY AS LONG AS YOU CALL BEFORE 4:00 PM  At the [facility], you and your health needs are our priority. As part of our continuing mission to provide you with exceptional heart care, we have created designated Provider Care Teams. These Care Teams include your primary Cardiologist (physician) and Advanced Practice Providers (APPs- Physician Assistants and Nurse Practitioners) who all work together to provide you with the care you need, when you need it.   You may see any of the following providers on your designated Care Team at your next follow up: Marland Kitchen Dr Arvilla Meres . Dr Marca Ancona . Dr Thornell Mule . Tonye Becket, NP . Robbie Lis, PA . Shanda Bumps [place],NP . Karle Plumber, PharmD   Please be sure to bring in all your medications bottles to every appointment.

## 2020-12-28 DIAGNOSIS — E663 Overweight: Secondary | ICD-10-CM | POA: Diagnosis not present

## 2020-12-28 DIAGNOSIS — Z6826 Body mass index (BMI) 26.0-26.9, adult: Secondary | ICD-10-CM | POA: Diagnosis not present

## 2020-12-28 DIAGNOSIS — J849 Interstitial pulmonary disease, unspecified: Secondary | ICD-10-CM | POA: Diagnosis not present

## 2020-12-28 DIAGNOSIS — I272 Pulmonary hypertension, unspecified: Secondary | ICD-10-CM | POA: Diagnosis not present

## 2020-12-28 DIAGNOSIS — I7300 Raynaud's syndrome without gangrene: Secondary | ICD-10-CM | POA: Diagnosis not present

## 2020-12-28 DIAGNOSIS — M351 Other overlap syndromes: Secondary | ICD-10-CM | POA: Diagnosis not present

## 2020-12-28 DIAGNOSIS — I4891 Unspecified atrial fibrillation: Secondary | ICD-10-CM | POA: Diagnosis not present

## 2021-01-17 DIAGNOSIS — J849 Interstitial pulmonary disease, unspecified: Secondary | ICD-10-CM | POA: Diagnosis not present

## 2021-01-17 DIAGNOSIS — I272 Pulmonary hypertension, unspecified: Secondary | ICD-10-CM | POA: Diagnosis not present

## 2021-02-01 ENCOUNTER — Other Ambulatory Visit: Payer: Self-pay | Admitting: Internal Medicine

## 2021-02-01 DIAGNOSIS — K219 Gastro-esophageal reflux disease without esophagitis: Secondary | ICD-10-CM

## 2021-02-16 DIAGNOSIS — J849 Interstitial pulmonary disease, unspecified: Secondary | ICD-10-CM | POA: Diagnosis not present

## 2021-02-16 DIAGNOSIS — I272 Pulmonary hypertension, unspecified: Secondary | ICD-10-CM | POA: Diagnosis not present

## 2021-03-19 DIAGNOSIS — J849 Interstitial pulmonary disease, unspecified: Secondary | ICD-10-CM | POA: Diagnosis not present

## 2021-03-19 DIAGNOSIS — I272 Pulmonary hypertension, unspecified: Secondary | ICD-10-CM | POA: Diagnosis not present

## 2021-03-24 ENCOUNTER — Encounter: Payer: Self-pay | Admitting: Internal Medicine

## 2021-03-30 ENCOUNTER — Other Ambulatory Visit: Payer: Self-pay

## 2021-03-30 ENCOUNTER — Ambulatory Visit (INDEPENDENT_AMBULATORY_CARE_PROVIDER_SITE_OTHER): Payer: Medicare PPO | Admitting: Internal Medicine

## 2021-03-30 ENCOUNTER — Encounter: Payer: Self-pay | Admitting: Internal Medicine

## 2021-03-30 VITALS — BP 110/74 | HR 70 | Temp 98.0°F | Ht 64.0 in | Wt 157.6 lb

## 2021-03-30 DIAGNOSIS — M8580 Other specified disorders of bone density and structure, unspecified site: Secondary | ICD-10-CM

## 2021-03-30 DIAGNOSIS — M351 Other overlap syndromes: Secondary | ICD-10-CM | POA: Diagnosis not present

## 2021-03-30 DIAGNOSIS — I10 Essential (primary) hypertension: Secondary | ICD-10-CM | POA: Diagnosis not present

## 2021-03-30 DIAGNOSIS — Z13820 Encounter for screening for osteoporosis: Secondary | ICD-10-CM

## 2021-03-30 DIAGNOSIS — N3000 Acute cystitis without hematuria: Secondary | ICD-10-CM

## 2021-03-30 DIAGNOSIS — R531 Weakness: Secondary | ICD-10-CM

## 2021-03-30 DIAGNOSIS — Z0000 Encounter for general adult medical examination without abnormal findings: Secondary | ICD-10-CM | POA: Diagnosis not present

## 2021-03-30 DIAGNOSIS — E559 Vitamin D deficiency, unspecified: Secondary | ICD-10-CM | POA: Diagnosis not present

## 2021-03-30 DIAGNOSIS — I272 Pulmonary hypertension, unspecified: Secondary | ICD-10-CM | POA: Diagnosis not present

## 2021-03-30 DIAGNOSIS — J849 Interstitial pulmonary disease, unspecified: Secondary | ICD-10-CM

## 2021-03-30 DIAGNOSIS — Z780 Asymptomatic menopausal state: Secondary | ICD-10-CM

## 2021-03-30 LAB — VITAMIN B12: Vitamin B-12: 501 pg/mL (ref 211–911)

## 2021-03-30 LAB — CBC WITH DIFFERENTIAL/PLATELET
Basophils Absolute: 0 10*3/uL (ref 0.0–0.1)
Basophils Relative: 1.1 % (ref 0.0–3.0)
Eosinophils Absolute: 0.1 10*3/uL (ref 0.0–0.7)
Eosinophils Relative: 2.6 % (ref 0.0–5.0)
HCT: 40.3 % (ref 36.0–46.0)
Hemoglobin: 13.3 g/dL (ref 12.0–15.0)
Lymphocytes Relative: 20.4 % (ref 12.0–46.0)
Lymphs Abs: 0.7 10*3/uL (ref 0.7–4.0)
MCHC: 33.1 g/dL (ref 30.0–36.0)
MCV: 94.9 fl (ref 78.0–100.0)
Monocytes Absolute: 0.4 10*3/uL (ref 0.1–1.0)
Monocytes Relative: 13.9 % — ABNORMAL HIGH (ref 3.0–12.0)
Neutro Abs: 2 10*3/uL (ref 1.4–7.7)
Neutrophils Relative %: 62 % (ref 43.0–77.0)
Platelets: 269 10*3/uL (ref 150.0–400.0)
RBC: 4.24 Mil/uL (ref 3.87–5.11)
RDW: 12.2 % (ref 11.5–15.5)
WBC: 3.2 10*3/uL — ABNORMAL LOW (ref 4.0–10.5)

## 2021-03-30 LAB — COMPREHENSIVE METABOLIC PANEL
ALT: 8 U/L (ref 0–35)
AST: 22 U/L (ref 0–37)
Albumin: 4.1 g/dL (ref 3.5–5.2)
Alkaline Phosphatase: 91 U/L (ref 39–117)
BUN: 13 mg/dL (ref 6–23)
CO2: 28 mEq/L (ref 19–32)
Calcium: 9.9 mg/dL (ref 8.4–10.5)
Chloride: 101 mEq/L (ref 96–112)
Creatinine, Ser: 0.88 mg/dL (ref 0.40–1.20)
GFR: 61.48 mL/min (ref >60.00)
Glucose, Bld: 94 mg/dL (ref 70–99)
Potassium: 4 mEq/L (ref 3.5–5.1)
Sodium: 140 mEq/L (ref 135–145)
Total Bilirubin: 0.6 mg/dL (ref 0.2–1.2)
Total Protein: 7.8 g/dL (ref 6.0–8.3)

## 2021-03-30 LAB — POCT URINALYSIS DIPSTICK
Bilirubin, UA: NEGATIVE
Blood, UA: NEGATIVE
Glucose, UA: NEGATIVE
Ketones, UA: NEGATIVE
Nitrite, UA: NEGATIVE
Protein, UA: POSITIVE — AB
Spec Grav, UA: 1.015 (ref 1.010–1.025)
Urobilinogen, UA: 0.2 E.U./dL
pH, UA: 6 (ref 5.0–8.0)

## 2021-03-30 LAB — LIPID PANEL
Cholesterol: 203 mg/dL — ABNORMAL HIGH (ref 0–200)
HDL: 48.4 mg/dL (ref >39.00)
LDL Cholesterol: 134 mg/dL — ABNORMAL HIGH (ref 0–99)
NonHDL: 154.88
Total CHOL/HDL Ratio: 4
Triglycerides: 106 mg/dL (ref 0.0–149.0)
VLDL: 21.2 mg/dL (ref 0.0–40.0)

## 2021-03-30 LAB — TSH: TSH: 2.9 u[IU]/mL (ref 0.35–5.50)

## 2021-03-30 LAB — HEMOGLOBIN A1C: Hgb A1c MFr Bld: 5.9 % (ref 4.6–6.5)

## 2021-03-30 LAB — VITAMIN D 25 HYDROXY (VIT D DEFICIENCY, FRACTURES): VITD: 35.29 ng/mL (ref 30.00–100.00)

## 2021-03-30 MED ORDER — SULFAMETHOXAZOLE-TRIMETHOPRIM 800-160 MG PO TABS: 1.0000 | ORAL_TABLET | Freq: Two times a day (BID) | ORAL | 0 refills | Status: AC

## 2021-03-30 NOTE — Progress Notes (Signed)
Established Patient Office Visit     This visit occurred during the SARS-CoV-2 public health emergency.  Safety protocols were in place, including screening questions prior to the visit, additional usage of staff PPE, and extensive cleaning of exam room while observing appropriate contact time as indicated for disinfecting solutions.    CC/Reason for Visit: Annual preventive exam and subsequent Medicare wellness visit  HPI: Sabrina Douglas is a 80 y.o. female who is coming in today for the above mentioned reasons. Past Medical History is significant for: Well-controlled hypertension and hyperlipidemia, history of A. fib that is rate controlled followed by cardiology.  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 Vassie Loll and Dr. Dierdre Forth.  She has been started on mycophenolate.  Had difficulty with palpitations with titrating doses.  Reviewing Dr. Prescott Gum latest note there has been an intermittent Mobitz type II block but without clear indication for pacemaker at this time.  Patient has been feeling weaker than usual.  No urine symptoms and of itself but daughter would like her urine tested.  She is overdue for eye and dental care.  She has no perceived hearing issues.  She is overdue for DEXA scan.  She is due for second COVID booster, Tdap and flu vaccine soon.  She has elected to defer all further cancer screening.   Past Medical/Surgical History: Past Medical History:  Diagnosis Date   Anxiety    CHF (congestive heart failure) (HCC)    Depression    Hyperlipidemia     Past Surgical History:  Procedure Laterality Date   BALLOON DILATION N/A 10/04/2018   Procedure: BALLOON DILATION;  Surgeon: Kathi Der, MD;  [facility];  Service: Gastroenterology;  Laterality: N/A;   BIOPSY  10/04/2018   Procedure: BIOPSY;  Surgeon: Kathi Der, MD;  Location: [facility];  Service:  Gastroenterology;;   ESOPHAGOGASTRODUODENOSCOPY (EGD) WITH PROPOFOL N/A 10/04/2018   Procedure: ESOPHAGOGASTRODUODENOSCOPY (EGD) WITH PROPOFOL;  Surgeon: Kathi Der, MD;  [facility]: [facility];  Service: Gastroenterology;  Laterality: N/A;   RIGHT HEART CATH N/A 10/15/2018   Procedure: RIGHT HEART CATH;  Surgeon: Dolores Patty, MD;  [facility];  Service: Cardiovascular;  Laterality: N/A;    Social History:  reports that she has never smoked. She has been exposed to tobacco smoke. She has never used smokeless tobacco. She reports that she does not drink alcohol. No history on file for drug use.  Allergies: No Known Allergies  Family History:  Family History  Problem Relation Age of Onset   Sudden death Mother 32       sudden death post hysterectomy   Prostate cancer Father    Diabetes Father      Current Outpatient Medications:    albuterol (PROVENTIL HFA;VENTOLIN HFA) 108 (90 Base) MCG/ACT inhaler, Inhale 2 puffs into the lungs every 6 (six) hours as needed for wheezing or shortness of breath., Disp: 1 Inhaler, Rfl: 6   apixaban (ELIQUIS) 5 MG TABS tablet, TAKE 1 TABLET(5 MG) BY MOUTH TWICE DAILY, Disp: 60 tablet, Rfl: 11   furosemide (LASIX) 40 MG tablet, TAKE 1 TABLET BY MOUTH DAILY, Disp: 90 tablet, Rfl: 1   Glycerin-Hypromellose-PEG 400 0.2-0.2-1 % SOLN, Place 2 drops into both eyes as needed (Dry eyes)., Disp: , Rfl:    metoprolol succinate (TOPROL-XL) 12.5 mg TB24 24 hr tablet, Take 12.5 mg by mouth. Half tab daily as needed, Disp: , Rfl:  Multiple Vitamin (MULTIVITAMIN WITH MINERALS) TABS tablet, Take 1 tablet by mouth daily., Disp: , Rfl:    mycophenolate (CELLCEPT) 500 MG tablet, Take 500 mg by mouth in the morning. 250 mg in the evening, Disp: , Rfl:    pantoprazole (PROTONIX) 40 MG tablet, TAKE 1 TABLET BY MOUTH EVERY DAY, Disp: 90 tablet, Rfl: 1   Respiratory Therapy Supplies (FLUTTER) DEVI, 1 Device by Does not apply route as needed., Disp:  1 each, Rfl: 0   sulfamethoxazole-trimethoprim (BACTRIM DS) 800-160 MG tablet, Take 1 tablet by mouth 2 (two) times daily for 7 days., Disp: 14 tablet, Rfl: 0  Review of Systems:  Constitutional: Denies fever, chills, diaphoresis, appetite change and fatigue.  HEENT: Denies photophobia, eye pain, redness, hearing loss, ear pain, congestion, sore throat, rhinorrhea, sneezing, mouth sores, trouble swallowing, neck pain, neck stiffness and tinnitus.   Respiratory: Denies SOB, DOE, cough, chest tightness,  and wheezing.   Cardiovascular: Denies chest pain, palpitations and leg swelling.  Gastrointestinal: Denies nausea, vomiting, abdominal pain, diarrhea, constipation, blood in stool and abdominal distention.  Genitourinary: Denies dysuria, urgency, frequency, hematuria, flank pain and difficulty urinating.  Endocrine: Denies: hot or cold intolerance, sweats, changes in hair or nails, polyuria, polydipsia. Musculoskeletal: Denies myalgias, back pain, joint swelling, arthralgias and gait problem.  Skin: Denies pallor, rash and wound.  Neurological: Denies dizziness, seizures, syncope,  light-headedness, numbness and headaches.  Hematological: Denies adenopathy. Easy bruising, personal or family bleeding history  Psychiatric/Behavioral: Denies suicidal ideation, mood changes, confusion, nervousness, sleep disturbance and agitation    Physical Exam: Vitals:   03/30/21 0908  BP: 110/74  Pulse: 70  Temp: 98 F (36.7 C)  TempSrc: Oral  SpO2: 97%  Weight: 157 lb 9.6 oz (71.5 kg)  Height: 5\' 4"  (1.626 m)    Body mass index is 27.05 kg/m.   Constitutional: NAD, calm, comfortable Eyes: PERRL, lids and conjunctivae normal ENMT: Mucous membranes are moist. Posterior pharynx clear of any exudate or lesions. Normal dentition. Tympanic membrane is pearly white, no erythema or bulging. Neck: normal, supple, no masses, no thyromegaly Respiratory: clear to auscultation bilaterally, no wheezing, no  crackles. Normal respiratory effort. No accessory muscle use.  Cardiovascular: Regular rate and rhythm, no murmurs / rubs / gallops. No extremity edema. 2+ pedal pulses. No carotid bruits.  Abdomen: no tenderness, no masses palpated. No hepatosplenomegaly. Bowel sounds positive.  Musculoskeletal: no clubbing / cyanosis. No joint deformity upper and lower extremities. Good ROM, no contractures. Normal muscle tone.  Skin: no rashes, lesions, ulcers. No induration Neurologic: CN 2-12 grossly intact. Sensation intact, DTR normal. Strength 5/5 in all 4.  Psychiatric: Normal judgment and insight. Alert and oriented x 3. Normal mood.    Subsequent Medicare wellness visit   1. Risk factors, based on past  M,S,F -cardiovascular disease risk factors include age, history of atrial fibrillation   2.  Physical activities: Sedentary   3.  Depression/mood: Stable, not depressed   4.  Hearing: No perceived issues   5.  ADL's: Independent in most ADLs   6.  Fall risk: Moderate fall risk   7.  Home safety: No problems identified   8.  Height weight, and visual acuity: height and weight as above, vision:  Vision Screening   Right eye Left eye Both eyes  Without correction     With correction 25/40 20/50 20/20      9.  Counseling: Advise she update her vaccination status   10. Lab orders based on risk factors:  Laboratory update will be reviewed   11. Referral : None today   12. Care plan: Follow-up with me in 6 months   13. Cognitive assessment: No cognitive impairment   14. Screening: Patient provided with a written and personalized 5-10 year screening schedule in the AVS. yes   15. Provider List Update: PCP, cardiologist Dr. Gala Romney, pulmonologist Dr. Vassie Loll, rheumatologist Dr. Dierdre Forth  16. Advance Directives: Full code   17. Opioids: Patient is not on any opioid prescriptions and has no risk factors for a substance use disorder.   Flowsheet Row Office Visit from 03/30/2021 in  [facility]  PHQ-9 Total Score 5       Fall Risk  03/30/2021 03/25/2020 02/05/2019 10/10/2018  Falls in the past year? 0 0 0 0  Number falls in past yr: 0 0 0 0  Injury with Fall? 0 0 0 0     Impression and Plan:  Encounter for preventive health examination -Advised routine eye and dental care. -Advised to get her second COVID booster, Tdap and flu vaccines. -Healthy lifestyle discussed in detail. -She has elected to defer all further cancer screening due to age.  Essential hypertension -Well-controlled.  MCTD (mixed connective tissue disease) (HCC) ILD (interstitial lung disease) (HCC)  Pulmonary hypertension, unspecified (HCC) -Followed by rheumatology and pulmonology.  Encounter for osteoporosis screening in asymptomatic postmenopausal patient  - Plan: DG Bone Density  Vitamin D deficiency  - Plan: DG Bone Density -Check vitamin D level.  Generalized weakness - Plan: POCT urinalysis dipstick, Urine Culture, Urine Culture Acute cystitis without hematuria - Plan: sulfamethoxazole-trimethoprim (BACTRIM DS) 800-160 MG tablet    Patient Instructions  -Nice seeing you today!!  -Lab work today; will notify you once results are available.  -remember your 2nd COVID booster, flu and tetanus vaccines.  -Bactrim DS 1 tablet twice daily for your urine infection.  -Schedule follow up in 6 months or sooner as needed.       Chaya Jan, MD [facility]

## 2021-03-30 NOTE — Patient Instructions (Addendum)
-  Nice seeing you today!!  -Lab work today; will notify you once results are available.  -remember your 2nd COVID booster, flu and tetanus vaccines.  -Bactrim DS 1 tablet twice daily for your urine infection.  -Schedule follow up in 6 months or sooner as needed.

## 2021-03-31 ENCOUNTER — Other Ambulatory Visit: Payer: Self-pay | Admitting: Internal Medicine

## 2021-03-31 DIAGNOSIS — E785 Hyperlipidemia, unspecified: Secondary | ICD-10-CM

## 2021-03-31 DIAGNOSIS — R300 Dysuria: Secondary | ICD-10-CM

## 2021-03-31 LAB — URINE CULTURE
MICRO NUMBER:: 12279343
SPECIMEN QUALITY:: ADEQUATE

## 2021-04-09 ENCOUNTER — Telehealth: Payer: Self-pay

## 2021-04-09 ENCOUNTER — Encounter: Payer: Self-pay | Admitting: Internal Medicine

## 2021-04-09 ENCOUNTER — Other Ambulatory Visit: Payer: Self-pay

## 2021-04-09 ENCOUNTER — Other Ambulatory Visit (INDEPENDENT_AMBULATORY_CARE_PROVIDER_SITE_OTHER): Payer: Medicare PPO

## 2021-04-09 DIAGNOSIS — E785 Hyperlipidemia, unspecified: Secondary | ICD-10-CM

## 2021-04-09 DIAGNOSIS — R300 Dysuria: Secondary | ICD-10-CM

## 2021-04-09 LAB — URINALYSIS, ROUTINE W REFLEX MICROSCOPIC
Bilirubin Urine: NEGATIVE
Ketones, ur: NEGATIVE
Nitrite: NEGATIVE
Specific Gravity, Urine: <=1.005 — AB (ref 1.000–1.030)
Total Protein, Urine: NEGATIVE
Urine Glucose: NEGATIVE
Urobilinogen, UA: 0.2 (ref 0.0–1.0)
pH: 6 (ref 5.0–8.0)

## 2021-04-09 LAB — LIPID PANEL
Cholesterol: 177 mg/dL (ref 0–200)
HDL: 45.1 mg/dL (ref >39.00)
LDL Cholesterol: 108 mg/dL — ABNORMAL HIGH (ref 0–99)
NonHDL: 132.04
Total CHOL/HDL Ratio: 4
Triglycerides: 120 mg/dL (ref 0.0–149.0)
VLDL: 24 mg/dL (ref 0.0–40.0)

## 2021-04-09 NOTE — Telephone Encounter (Signed)
Daughter of patient called asking if lab results could be reviewed today so that patient could start on a antibiotic if needed

## 2021-04-13 NOTE — Telephone Encounter (Signed)
Responded via MyChart.

## 2021-04-14 ENCOUNTER — Other Ambulatory Visit (INDEPENDENT_AMBULATORY_CARE_PROVIDER_SITE_OTHER): Payer: Medicare PPO

## 2021-04-14 ENCOUNTER — Other Ambulatory Visit: Payer: Medicare PPO

## 2021-04-14 DIAGNOSIS — R300 Dysuria: Secondary | ICD-10-CM

## 2021-04-14 LAB — URINALYSIS, ROUTINE W REFLEX MICROSCOPIC
Bilirubin Urine: NEGATIVE
Hgb urine dipstick: NEGATIVE
Ketones, ur: NEGATIVE
Nitrite: NEGATIVE
Specific Gravity, Urine: 1.01 (ref 1.000–1.030)
Total Protein, Urine: NEGATIVE
Urine Glucose: NEGATIVE
Urobilinogen, UA: 0.2 (ref 0.0–1.0)
pH: 6 (ref 5.0–8.0)

## 2021-04-15 ENCOUNTER — Other Ambulatory Visit: Payer: Self-pay

## 2021-04-15 ENCOUNTER — Ambulatory Visit
Admission: RE | Admit: 2021-04-15 | Discharge: 2021-04-15 | Disposition: A | Discharge status: Home / Self Care | Payer: Medicare PPO | Source: Ambulatory Visit | Attending: Internal Medicine | Admitting: Internal Medicine

## 2021-04-15 DIAGNOSIS — M85852 Other specified disorders of bone density and structure, left thigh: Secondary | ICD-10-CM | POA: Diagnosis not present

## 2021-04-15 DIAGNOSIS — E559 Vitamin D deficiency, unspecified: Secondary | ICD-10-CM

## 2021-04-15 DIAGNOSIS — Z780 Asymptomatic menopausal state: Secondary | ICD-10-CM

## 2021-04-15 DIAGNOSIS — Z13820 Encounter for screening for osteoporosis: Secondary | ICD-10-CM

## 2021-04-15 DIAGNOSIS — M8580 Other specified disorders of bone density and structure, unspecified site: Secondary | ICD-10-CM

## 2021-04-15 LAB — URINE CULTURE
MICRO NUMBER:: 12341698
SPECIMEN QUALITY:: ADEQUATE

## 2021-04-19 DIAGNOSIS — J849 Interstitial pulmonary disease, unspecified: Secondary | ICD-10-CM | POA: Diagnosis not present

## 2021-04-19 DIAGNOSIS — I272 Pulmonary hypertension, unspecified: Secondary | ICD-10-CM | POA: Diagnosis not present

## 2021-04-20 ENCOUNTER — Encounter (HOSPITAL_COMMUNITY): Payer: Self-pay | Admitting: Internal Medicine

## 2021-04-20 ENCOUNTER — Ambulatory Visit (HOSPITAL_COMMUNITY)
Admission: RE | Admit: 2021-04-20 | Discharge: 2021-04-20 | Disposition: A | Discharge status: Home / Self Care | Payer: Medicare PPO | Source: Ambulatory Visit | Attending: Internal Medicine | Admitting: Internal Medicine

## 2021-04-20 ENCOUNTER — Other Ambulatory Visit: Payer: Self-pay

## 2021-04-20 VITALS — BP 122/80 | HR 77 | Wt 156.6 lb

## 2021-04-20 DIAGNOSIS — R0602 Shortness of breath: Secondary | ICD-10-CM | POA: Insufficient documentation

## 2021-04-20 DIAGNOSIS — M351 Other overlap syndromes: Secondary | ICD-10-CM | POA: Insufficient documentation

## 2021-04-20 DIAGNOSIS — I441 Atrioventricular block, second degree: Secondary | ICD-10-CM | POA: Insufficient documentation

## 2021-04-20 DIAGNOSIS — Z7722 Contact with and (suspected) exposure to environmental tobacco smoke (acute) (chronic): Secondary | ICD-10-CM | POA: Diagnosis not present

## 2021-04-20 DIAGNOSIS — I480 Paroxysmal atrial fibrillation: Secondary | ICD-10-CM

## 2021-04-20 DIAGNOSIS — I110 Hypertensive heart disease with heart failure: Secondary | ICD-10-CM | POA: Insufficient documentation

## 2021-04-20 DIAGNOSIS — R001 Bradycardia, unspecified: Secondary | ICD-10-CM | POA: Diagnosis not present

## 2021-04-20 DIAGNOSIS — J8410 Pulmonary fibrosis, unspecified: Secondary | ICD-10-CM | POA: Insufficient documentation

## 2021-04-20 DIAGNOSIS — J9611 Chronic respiratory failure with hypoxia: Secondary | ICD-10-CM | POA: Diagnosis not present

## 2021-04-20 DIAGNOSIS — I272 Pulmonary hypertension, unspecified: Secondary | ICD-10-CM | POA: Diagnosis not present

## 2021-04-20 DIAGNOSIS — Z79899 Other long term (current) drug therapy: Secondary | ICD-10-CM | POA: Insufficient documentation

## 2021-04-20 DIAGNOSIS — I4510 Unspecified right bundle-branch block: Secondary | ICD-10-CM | POA: Diagnosis not present

## 2021-04-20 DIAGNOSIS — Z7901 Long term (current) use of anticoagulants: Secondary | ICD-10-CM | POA: Insufficient documentation

## 2021-04-20 DIAGNOSIS — I509 Heart failure, unspecified: Secondary | ICD-10-CM | POA: Diagnosis not present

## 2021-04-20 MED ORDER — FLUTICASONE PROPIONATE 50 MCG/ACT NA SUSP: 2.0000 | Freq: Every day | NASAL | 6 refills | Status: DC

## 2021-04-20 NOTE — Patient Instructions (Signed)
Use Flonase 2 puffs each nostril Daily  Your physician has requested that you have an echocardiogram. Echocardiography is a painless test that uses sound waves to create images of your heart. It provides your doctor with information about the size and shape of your heart and how well your heart's chambers and valves are working. This procedure takes approximately one hour. There are no restrictions for this procedure.  Please call our office in February to schedule your follow up appointment  If you have any questions or concerns before your next appointment please send [place] a message through [place] or call our office at [phone].    TO LEAVE A MESSAGE FOR THE NURSE SELECT OPTION 2, PLEASE LEAVE A MESSAGE INCLUDING: YOUR NAME DATE OF BIRTH CALL BACK NUMBER REASON FOR CALL**this is important as we prioritize the call backs  YOU WILL RECEIVE A CALL BACK THE SAME DAY AS LONG AS YOU CALL BEFORE 4:00 PM  At the [facility], you and your health needs are our priority. As part of our continuing mission to provide you with exceptional heart care, we have created designated Provider Care Teams. These Care Teams include your primary Cardiologist (physician) and Advanced Practice Providers (APPs- Physician Assistants and Nurse Practitioners) who all work together to provide you with the care you need, when you need it.   You may see any of the following providers on your designated Care Team at your next follow up: Dr Arvilla Meres Dr Marca Ancona Dr Brandon Melnick, NP Robbie Lis, [place] Mikki Santee Karle Plumber, PharmD   Please be sure to bring in all your medications bottles to every appointment.

## 2021-04-20 NOTE — Progress Notes (Addendum)
ADVANCED HF CLINIC NOTE  Date:  04/20/2021   ID:  Sabrina Douglas, DOB 09-14-1940, MRN [id number]  Location: Home  Provider location: [facility] Type of Visit: Established patient  PCP:  Isaac Bliss, Rayford Halsted, MD  Cardiologist:  None Primary HF: Gilbert Narain  Chief Complaint: Heart Failure follow-up   History of Present Illness:  HPI:  Sabrina Douglas is a 80 y.o. female with HTN, HL, anxiety/depression, and ILD due MCTD.    Patient seen for initial consult with Dr. [name] on 08/28/18. CXR showed mildly worsened bibasilar pneumonia, suggestive of atypical infection d/t waxing/weaning symptoms. 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  ANA positive, CCP negative. 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    Admitted 2/20 with severe weakness. Fount to have hyponatremia at 114, dysphagia and about 20 pounds weight loss in the months. Treated with 3% saline and eventually demeclocycline.   Pt admitted 3/20 with new onset Afib. Converted back to NSR with amiodarone drip and was discontinued. Toprol resumed. Eliquis started. CT chest in 3/20 no coronary calcification. [place] 3/20 no PAH.   Follows with Dr. [name] & [name]. 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 Was on plaquenil/prednisone. Prednisone stopped in 10/21. Switched to Cellcept. Hires CT improving.    C/o chest tightness. Coronary CT ordered but never completed because she did not want to take b-blocker..   Last visit 12/2020 Dr [name] recently and started on mycophenalate. Volume status doing well.    Here for FU, says she has been doing well other than recent UTI.  Still having some shortness of breath with exertion.  Can walk around on flat ground okay but has a lot of trouble with stairs and inclines gets very short of breath.  Sometimes short of breath taking out the trash. Weight has been stable at around 156-157.   HR at home 60-70's  Zio 10/12/20 1. Sinus rhythm -  avg HR of 78 bpm.  2. Bundle Branch Block/IVCD was present.  3. 16 runs of SVT occurred, the run with the fastest interval lasting 7 beats with a max rate of 190 bpm, the longest lasting 8 beats with an avg rate of 114 bpm. 4. 708 episode(s) of brief nocturnal AV Block (2nd Mobitz II) occurred,  5. Rare PACs and PVCs 6. Nearly all patient-triggered events associated with sinus rhythm     Studies:  HiRes CT 10/20 1. Bilateral lower lobe predominant bronchiectasis and volume loss may be postinfectious or post inflammatory in etiology. Findings are suggestive of an alternative diagnosis (not UIP) per consensus guidelines: Diagnosis of Idiopathic Pulmonary Fibrosis   Zio Patch 11/28/18  1. Predominant rhythm was sinus. Patient had a min HR of 54 bpm, max HR of 214 bpm, and avg HR of 96 bpm. 2. Two brief runs of NSVT - longest was 8 beats 3. Forty five brief runs of SVT. The run with the fastest interval lasting 5 beats with a max rate of 214 bpm, the longest lasting 15.7 secs with an avg rate of 154 bpm. 4. Rare PVCs    RHC 10/15/18 RA = 3 RV = 39/4 PA = 39/7 (18) PCW = 4 Fick cardiac output/index = 6.5/3.8 PVR = 2.1 WU Ao sat = 100% PA sat = 81%, 82% High SVC and axillary sats both 81%   Assessment:  1. Normal pulmonary pressures 2. High  output physiology without evidence of intracardiac shunting    Labs  ANA 1:1280 (pos) ESR 104  RF 55 (pos) RNP > 8.0  (pos) AntiJo negative CCP < 16 Anti-Smith 2.0 (pos) Scl-70 < 1.0 ANCA negative   HRCT 09/10/18 1. 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 Repeat high-resolution chest CT is recommended in 12 months to assess for temporal changes in the appearance of the lung parenchyma. 2. Aortic atherosclerosis.   Echo 09/30/18  1. The left ventricle has hyperdynamic  systolic function, with an ejection fraction of >65%. The cavity size was normal. Left ventricular diastolic parameters were normal No evidence of left ventricular regional wall motion abnormalities.  2. No evidence of left ventricular regional wall motion abnormalities.  3. The right ventricle has normal systolic function. The cavity was grossly normal. RA-RV peak gradient increased at 66.9 mmHg consistent with probable severe pulmonary hypertension. Could not estimate CVP however.  4. The aortic valve is tricuspid Mild aortic annular calcification noted.  5. The mitral valve is normal in structure.  6. The tricuspid valve is normal in structure. Tricuspid valve regurgitation is    PFTs  (10/02/18) FEV1 1.13 (66%) FVC   1.23 (56%) DLCO 16%   VQ scan 10/03/18: Negative    Sabrina Douglas denies symptoms worrisome for COVID 19.   Past Medical History:  Diagnosis Date   Anxiety    CHF (congestive heart failure) ([place])    Depression    Hyperlipidemia    Past Surgical History:  Procedure Laterality Date   BALLOON DILATION N/A 10/04/2018   Procedure: BALLOON DILATION;  Surgeon: Otis Brace, MD;  Location: [facility];  Service: Gastroenterology;  Laterality: N/A;   BIOPSY  10/04/2018   Procedure: BIOPSY;  Surgeon: Otis Brace, MD;  Location: [facility];  Service: Gastroenterology;;   ESOPHAGOGASTRODUODENOSCOPY (EGD) WITH PROPOFOL N/A 10/04/2018   Procedure: ESOPHAGOGASTRODUODENOSCOPY (EGD) WITH PROPOFOL;  Surgeon: Otis Brace, MD;  Location: [facility];  Service: Gastroenterology;  Laterality: N/A;   RIGHT HEART CATH N/A 10/15/2018   Procedure: RIGHT HEART CATH;  Surgeon: Jolaine Artist, MD;  Location: [facility];  Service: Cardiovascular;  Laterality: N/A;     Current Outpatient Medications  Medication Sig Dispense Refill   apixaban (ELIQUIS) 5 MG TABS tablet TAKE 1 TABLET(5 MG) BY MOUTH TWICE DAILY 60 tablet 11   furosemide (LASIX) 40 MG tablet  TAKE 1 TABLET BY MOUTH DAILY 90 tablet 1   Glycerin-Hypromellose-PEG 400 0.2-0.2-1 % SOLN Place 2 drops into both eyes as needed (Dry eyes).     metoprolol succinate (TOPROL-XL) 12.5 mg TB24 24 hr tablet Take 12.5 mg by mouth. Half tab daily as needed     Multiple Vitamin (MULTIVITAMIN WITH MINERALS) TABS tablet Take 1 tablet by mouth daily.     mycophenolate (CELLCEPT) 500 MG tablet Take by mouth 2 (two) times daily.     OXYGEN Inhale into the lungs. As needed     pantoprazole (PROTONIX) 40 MG tablet TAKE 1 TABLET BY MOUTH EVERY DAY 90 tablet 1   No current facility-administered medications for this encounter.    Allergies:   Patient has no known allergies.   Social History:  The patient  reports that she has never smoked. She has been exposed to tobacco smoke. She has never used smokeless tobacco. She reports that she does not drink alcohol.   Family History:  The patient's family history  includes Diabetes in her father; Prostate cancer in her father; Sudden death (age of onset: 39) in her mother.   ROS:  Please see the history of present illness.   All other systems are personally reviewed and negative.   Vitals:   04/20/21 1224  BP: 122/80  Pulse: 77  SpO2: 99%  Weight: 71 kg (156 lb 9.6 oz)     Exam:   General:  Elderly woman No resp difficulty HEENT: normal Neck: supple. no JVD. Carotids 2+ bilat; no bruits. No lymphadenopathy or thryomegaly appreciated. Cor: PMI nondisplaced. Regular rate & rhythm. No rubs, gallops or murmurs. Lungs: clear Abdomen: soft, nontender, nondistended. No hepatosplenomegaly. No bruits or masses. Good bowel sounds. Extremities: no cyanosis, clubbing, rash, edema Neuro: alert & orientedx3, cranial nerves grossly intact. moves all 4 extremities w/o difficulty. Affect pleasant   Recent Labs: 03/30/2021: ALT 8; BUN 13; Creatinine, Ser 0.88; Hemoglobin 13.3; Platelets 269.0; Potassium 4.0; Sodium 140; TSH 2.90  Personally reviewed   Wt Readings  from Last 3 Encounters:  04/20/21 71 kg (156 lb 9.6 oz)  03/30/21 71.5 kg (157 lb 9.6 oz)  12/24/20 72.4 kg (159 lb 9.6 oz)     ASSESSMENT AND PLAN:  1. Bradycardia - has brief nocturnal Mobitz II block but no syncope.  - no clear indication for PPM now. - she would like to avoid PPM if possible   2. RBBB - stable  3. 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 [name] in Pulmonary - Much improved with steroids. - VQ is negative. - RHC 10/15/18 with normal pulmonary pressures and high output physiology without evidence of intracardiac shunting so not candidate for selective pulmonary vasodilators  - Now on CellCept per Dr. [name]. Dose being adjusted. Doubt the higher dose of CellCept will be associated with worsening bradycardia   5. Chronic hypoxic respiratory failure - likely due to ILD & less PAH - Much improved with treatment of MCTD. Only wears O2 at night now - Followed closely by Dr [name]. Last hi-res CT 4/22 slightly worse ILD  - continue current regimen   6. Afib, paroxysmal/PSVT - In NSR today. No AF last visit or on Zio - Continue Eliquis 5 mg BID. Continue 5 bid If weight < 60 kg or cr >= 1.5 will decrease dose.  - This patients CHA2DS2-VASc is 4.     Katherine Roan, MD  04/20/2021 12:30 PM  [facility] [place] [address] [phone] (office) [phone] (fax)  Patient seen and examined with the above-signed Advanced Practice Provider and/or Housestaff. I personally reviewed laboratory data, imaging studies and relevant notes. I independently examined the patient and formulated the important aspects of the plan. I have edited the note to reflect any of my changes or salient points. I have personally discussed the plan with the patient and/or  family.  Here with her daughter for f/u.   Has been feeling pretty good NYHA II-early III. LE edema under good control. HRs stable. Now back on CellCept.   General:  Elderly. No resp difficulty HEENT: normal Neck: supple. no JVD. Carotids 2+ bilat; no bruits. No lymphadenopathy or thryomegaly appreciated. Cor: PMI nondisplaced. Regular rate & rhythm. No rubs, gallops or murmurs. Lungs: clear Abdomen: soft, nontender, nondistended. No hepatosplenomegaly. No bruits or masses. Good bowel sounds. Extremities:  no cyanosis, clubbing, rash, edema Neuro: alert & orientedx3, cranial nerves grossly intact. moves all 4 extremities w/o difficulty. Affect pleasant  Currently stable from cardiac standpoint. Needs repeat echo to make sure she is not developing PH or RV strain in setting of CTD. Continue eliquis for PAF. Check labs.   Glori Bickers, MD  2:00 PM

## 2021-04-21 ENCOUNTER — Telehealth: Payer: Self-pay | Admitting: Pulmonary Disease

## 2021-04-21 NOTE — Telephone Encounter (Signed)
Called and spoke with pt's daughter [name] letting her now all the info stated by RB and she verbalized understanding. Nothing further needed.

## 2021-04-21 NOTE — Telephone Encounter (Signed)
Understand their concern but I think the best plan now is to practice good isolation, avoidance of any potential exposures.  It is reassuring that the patient's daughter is negative.  Would be best for Ms. Rising to try to avoid any direct or even secondhand exposures to the patient's son-in-law if at all possible until he has been asymptomatic for 5 days.  Wear a mask, practice good hand hygiene if proximity is unavoidable. I do not think the patient should stop CellCept in this setting.  I think we should be able to manage by good quarantine practice. Note that the patient had her second COVID booster on 9/13, can sometimes cause side effects including arm soreness, fatigue, headache, low-grade fever.  Will have to use our judgment if she develops any of the symptoms, particularly regarding whether the patient herself will need to be tested at any point.  If the patient develops symptoms either consistent with vaccine side effects or COVID-19 than should probably call our office so that we can review and decide best way to evaluate.

## 2021-04-21 NOTE — Telephone Encounter (Signed)
I called and spoke with daughter regarding message. Daughter [name] is on DPR and stated her husband tested positive for covid yesterday. [name] has tested negative with rapid test and waiting to hear back on PCR test but not having any symptoms. Patient is not having any symptoms as well. Daughter is wondering what to do to keep patient from being exposed, if not already and what to do if patient starts having symptoms and if there is any of the patient's current meds the patient should stop taking, esp the Cellcept. Patient just received 2nd covid booster yesterday. Dr. Vassie Loll on nightfloat so will right to DOD.  Dr. Delton Coombes, please advise. Thanks!

## 2021-04-25 DIAGNOSIS — Z03818 Encounter for observation for suspected exposure to other biological agents ruled out: Secondary | ICD-10-CM | POA: Diagnosis not present

## 2021-05-03 DIAGNOSIS — H2513 Age-related nuclear cataract, bilateral: Secondary | ICD-10-CM | POA: Diagnosis not present

## 2021-05-03 DIAGNOSIS — H35033 Hypertensive retinopathy, bilateral: Secondary | ICD-10-CM | POA: Diagnosis not present

## 2021-05-03 DIAGNOSIS — H04129 Dry eye syndrome of unspecified lacrimal gland: Secondary | ICD-10-CM | POA: Diagnosis not present

## 2021-05-04 DIAGNOSIS — I7300 Raynaud's syndrome without gangrene: Secondary | ICD-10-CM | POA: Diagnosis not present

## 2021-05-04 DIAGNOSIS — J849 Interstitial pulmonary disease, unspecified: Secondary | ICD-10-CM | POA: Diagnosis not present

## 2021-05-04 DIAGNOSIS — I272 Pulmonary hypertension, unspecified: Secondary | ICD-10-CM | POA: Diagnosis not present

## 2021-05-04 DIAGNOSIS — E663 Overweight: Secondary | ICD-10-CM | POA: Diagnosis not present

## 2021-05-04 DIAGNOSIS — I4891 Unspecified atrial fibrillation: Secondary | ICD-10-CM | POA: Diagnosis not present

## 2021-05-04 DIAGNOSIS — Z6826 Body mass index (BMI) 26.0-26.9, adult: Secondary | ICD-10-CM | POA: Diagnosis not present

## 2021-05-04 DIAGNOSIS — M351 Other overlap syndromes: Secondary | ICD-10-CM | POA: Diagnosis not present

## 2021-05-13 ENCOUNTER — Ambulatory Visit (HOSPITAL_COMMUNITY)
Admission: RE | Admit: 2021-05-13 | Discharge: 2021-05-13 | Disposition: A | Discharge status: Home / Self Care | Payer: Medicare PPO | Source: Ambulatory Visit | Attending: Internal Medicine | Admitting: Internal Medicine

## 2021-05-13 ENCOUNTER — Other Ambulatory Visit: Payer: Self-pay

## 2021-05-13 DIAGNOSIS — I4891 Unspecified atrial fibrillation: Secondary | ICD-10-CM | POA: Diagnosis not present

## 2021-05-13 DIAGNOSIS — E785 Hyperlipidemia, unspecified: Secondary | ICD-10-CM | POA: Diagnosis not present

## 2021-05-13 DIAGNOSIS — I272 Pulmonary hypertension, unspecified: Secondary | ICD-10-CM | POA: Insufficient documentation

## 2021-05-13 DIAGNOSIS — I10 Essential (primary) hypertension: Secondary | ICD-10-CM | POA: Insufficient documentation

## 2021-05-13 LAB — ECHOCARDIOGRAM COMPLETE
Area-P 1/2: 2.52 cm2
S' Lateral: 2.4 cm

## 2021-05-13 NOTE — Progress Notes (Signed)
Echocardiogram 2D Echocardiogram has been performed.  Warren Lacy Isley Weisheit RDCS 05/13/2021, 10:54 AM

## 2021-05-19 DIAGNOSIS — I272 Pulmonary hypertension, unspecified: Secondary | ICD-10-CM | POA: Diagnosis not present

## 2021-05-19 DIAGNOSIS — J849 Interstitial pulmonary disease, unspecified: Secondary | ICD-10-CM | POA: Diagnosis not present

## 2021-06-19 DIAGNOSIS — I272 Pulmonary hypertension, unspecified: Secondary | ICD-10-CM | POA: Diagnosis not present

## 2021-06-19 DIAGNOSIS — J849 Interstitial pulmonary disease, unspecified: Secondary | ICD-10-CM | POA: Diagnosis not present

## 2021-07-13 DIAGNOSIS — H25013 Cortical age-related cataract, bilateral: Secondary | ICD-10-CM | POA: Diagnosis not present

## 2021-07-13 DIAGNOSIS — H2513 Age-related nuclear cataract, bilateral: Secondary | ICD-10-CM | POA: Diagnosis not present

## 2021-07-13 DIAGNOSIS — H18413 Arcus senilis, bilateral: Secondary | ICD-10-CM | POA: Diagnosis not present

## 2021-07-13 DIAGNOSIS — H25043 Posterior subcapsular polar age-related cataract, bilateral: Secondary | ICD-10-CM | POA: Diagnosis not present

## 2021-07-13 DIAGNOSIS — H2512 Age-related nuclear cataract, left eye: Secondary | ICD-10-CM | POA: Diagnosis not present

## 2021-07-19 ENCOUNTER — Telehealth (HOSPITAL_COMMUNITY): Payer: Self-pay | Admitting: *Deleted

## 2021-07-19 DIAGNOSIS — I272 Pulmonary hypertension, unspecified: Secondary | ICD-10-CM | POA: Diagnosis not present

## 2021-07-19 DIAGNOSIS — J849 Interstitial pulmonary disease, unspecified: Secondary | ICD-10-CM | POA: Diagnosis not present

## 2021-07-19 NOTE — Telephone Encounter (Signed)
Pts daughter [name] called stating pt is scheduled for cataract surgery Friday 12/16. Pt was told by provider performing her procedure that she did not need to hold eliquis for procedure but pt wanted to make sure that was ok with Dr.Bensimhon.  Routed to Dr.Bensimhon for advice

## 2021-07-19 NOTE — Telephone Encounter (Signed)
Daughter aware and thanked me for the call

## 2021-07-23 DIAGNOSIS — H2511 Age-related nuclear cataract, right eye: Secondary | ICD-10-CM | POA: Diagnosis not present

## 2021-07-23 DIAGNOSIS — H25011 Cortical age-related cataract, right eye: Secondary | ICD-10-CM | POA: Diagnosis not present

## 2021-07-23 DIAGNOSIS — H25041 Posterior subcapsular polar age-related cataract, right eye: Secondary | ICD-10-CM | POA: Diagnosis not present

## 2021-07-23 DIAGNOSIS — H2512 Age-related nuclear cataract, left eye: Secondary | ICD-10-CM | POA: Diagnosis not present

## 2021-07-28 ENCOUNTER — Other Ambulatory Visit: Payer: Self-pay | Admitting: Internal Medicine

## 2021-07-28 DIAGNOSIS — J849 Interstitial pulmonary disease, unspecified: Secondary | ICD-10-CM | POA: Diagnosis not present

## 2021-07-28 DIAGNOSIS — M359 Systemic involvement of connective tissue, unspecified: Secondary | ICD-10-CM | POA: Diagnosis not present

## 2021-07-28 DIAGNOSIS — I10 Essential (primary) hypertension: Secondary | ICD-10-CM | POA: Diagnosis not present

## 2021-07-28 DIAGNOSIS — I4891 Unspecified atrial fibrillation: Secondary | ICD-10-CM | POA: Diagnosis not present

## 2021-07-28 DIAGNOSIS — K219 Gastro-esophageal reflux disease without esophagitis: Secondary | ICD-10-CM | POA: Diagnosis not present

## 2021-07-28 DIAGNOSIS — J302 Other seasonal allergic rhinitis: Secondary | ICD-10-CM | POA: Diagnosis not present

## 2021-07-28 DIAGNOSIS — D84821 Immunodeficiency due to drugs: Secondary | ICD-10-CM | POA: Diagnosis not present

## 2021-07-28 DIAGNOSIS — M1990 Unspecified osteoarthritis, unspecified site: Secondary | ICD-10-CM | POA: Diagnosis not present

## 2021-07-28 DIAGNOSIS — D6869 Other thrombophilia: Secondary | ICD-10-CM | POA: Diagnosis not present

## 2021-08-11 ENCOUNTER — Ambulatory Visit: Payer: Medicare PPO | Admitting: Pulmonary Disease

## 2021-08-11 ENCOUNTER — Encounter: Payer: Self-pay | Admitting: Pulmonary Disease

## 2021-08-11 ENCOUNTER — Other Ambulatory Visit: Payer: Self-pay

## 2021-08-11 DIAGNOSIS — J849 Interstitial pulmonary disease, unspecified: Secondary | ICD-10-CM | POA: Diagnosis not present

## 2021-08-11 DIAGNOSIS — I272 Pulmonary hypertension, unspecified: Secondary | ICD-10-CM | POA: Diagnosis not present

## 2021-08-11 DIAGNOSIS — J9611 Chronic respiratory failure with hypoxia: Secondary | ICD-10-CM | POA: Diagnosis not present

## 2021-08-11 MED ORDER — CHLORPHENIRAMINE MALEATE 4 MG PO TABS: 4.0000 mg | ORAL_TABLET | Freq: Two times a day (BID) | ORAL | 0 refills | Status: DC | PRN

## 2021-08-11 NOTE — Patient Instructions (Signed)
°  Trial of chlortrimeton 4 mg x 1-2 weeks instead of allegra  X Ambulatory sat  X HRCT chest in April 2023

## 2021-08-11 NOTE — Assessment & Plan Note (Signed)
Last echo does not reveal significant pulmonary hypertension

## 2021-08-11 NOTE — Assessment & Plan Note (Signed)
Ambulatory saturation today to reassess. She is resistant to start the center-based pulmonary rehab program

## 2021-08-11 NOTE — Assessment & Plan Note (Signed)
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 She remains on CellCept 100 twice daily, will titrate dose based on joint disease, doubt she will tolerate higher dose.  If lung disease worsens, would have to add steroids

## 2021-08-11 NOTE — Progress Notes (Signed)
Subjective:    Patient ID: Sabrina Douglas, female    DOB: [birth year], 81 y.o.   MRN: [id number]  HPI   81 yo never smoker for follow-up of CT -ILD Symptom onset around 05/2018    HRCT confirmed ILD probable UIP pattern.  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    Sees Dr. [name] -initially started on Plaquenil and 10 mg of prednisone, gradually tapered to off 02/2020 05/2020 started on CellCept    PMH - 10/2020 -new onset RBBB noted on EKG -felt to be conduction disease,  on Eliquis for paroxysmal A. Fib Seen by ENT for dysphagia  Chief Complaint  Patient presents with   Follow-up    Follow up. Pt states that she sometimes has issues with her breathing due to moving around too  much, but over all she is well   Accompanied by her daughter. She has good days and bad days, complains of dyspnea and usual activities.  She stills tries to be active around the house.  Remains on CellCept 500 twice daily, joints are stiff in the mornings and occasional swelling. Compliant with Lasix daily. Previous cardiology evaluation was reviewed, remains on apixaban for paroxysmal atrial fibrillation  Complains of postnasal drip-Flonase and Allegra has not helped  Significant tests/ events reviewed  PFTs 04/2020 -mild restriction, ratio 85, FEV1 92%, FVC 84%, TLC 65%, DLCO 62% >> much improved compared to prior     ANA positive 1: 1280, Sm Ab 2.0, RNP > 8.0 ,CCP negative    10/02/2018 PFTs ratio 92, moderate to severe restriction FEV1 66%, FVC 56%, TLC 46%, DLCO 17% corrects to 51% for alveolar volume   Echo 05/2021 normal RVSP Echo 09/2018 RVSP 67 Cath 10/2018 RA 3, PA 39/7 , CI 3.8 PVR2.1 Wu >> normal pulmonary pressures, high-output physiology    HRCT 11/2020 Basilar predominant bronchiectasis, ground-glass and volume loss,progressive from 05/22/2019 " alternative " (not UIP ) ? Fibrotic NSIP  HRCT 05/2019 Bilateral lower lobe predominant bronchiectasis and volume  loss may be postinfectious or post inflammatory in etiology HRCT 09/10/2018 -probable UIP 09/2018 VQ scan very low probability Esophagram 09/2018 smooth narrowing of distal esophagus?  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 Also denies presyncope, palpitations, heartburn, abdominal pain, nausea, vomiting, diarrhea or change in bowel or urinary habits, dysuria,hematuria, rash, arthralgias, visual complaints, headache, numbness weakness or ataxia.     Objective:   Physical Exam  Gen. Pleasant, elderly, well-nourished, in no distress ENT - no thrush, no pallor/icterus,no post nasal drip Neck: No JVD, no thyromegaly, no carotid bruits Lungs: no use of accessory muscles, no dullness to percussion, bibasal dry rales or rhonchi  Cardiovascular: Rhythm regular, heart sounds  normal, no murmurs or gallops, no peripheral edema Musculoskeletal: No deformities, no cyanosis or clubbing        Assessment & Plan:   Postnasal drip -chronic cough could be related to ILD or postnasal drip, will trial trial Chlor-Trimeton for 2 weeks since Flonase and Allegra has  not worked.

## 2021-08-19 DIAGNOSIS — I272 Pulmonary hypertension, unspecified: Secondary | ICD-10-CM | POA: Diagnosis not present

## 2021-08-19 DIAGNOSIS — J849 Interstitial pulmonary disease, unspecified: Secondary | ICD-10-CM | POA: Diagnosis not present

## 2021-08-24 DIAGNOSIS — Z6826 Body mass index (BMI) 26.0-26.9, adult: Secondary | ICD-10-CM | POA: Diagnosis not present

## 2021-08-24 DIAGNOSIS — J849 Interstitial pulmonary disease, unspecified: Secondary | ICD-10-CM | POA: Diagnosis not present

## 2021-08-24 DIAGNOSIS — I4891 Unspecified atrial fibrillation: Secondary | ICD-10-CM | POA: Diagnosis not present

## 2021-08-24 DIAGNOSIS — E663 Overweight: Secondary | ICD-10-CM | POA: Diagnosis not present

## 2021-08-24 DIAGNOSIS — I7300 Raynaud's syndrome without gangrene: Secondary | ICD-10-CM | POA: Diagnosis not present

## 2021-08-24 DIAGNOSIS — I272 Pulmonary hypertension, unspecified: Secondary | ICD-10-CM | POA: Diagnosis not present

## 2021-08-24 DIAGNOSIS — M351 Other overlap syndromes: Secondary | ICD-10-CM | POA: Diagnosis not present

## 2021-08-27 ENCOUNTER — Encounter: Payer: Self-pay | Admitting: Internal Medicine

## 2021-09-19 DIAGNOSIS — I272 Pulmonary hypertension, unspecified: Secondary | ICD-10-CM | POA: Diagnosis not present

## 2021-09-19 DIAGNOSIS — J849 Interstitial pulmonary disease, unspecified: Secondary | ICD-10-CM | POA: Diagnosis not present

## 2021-09-24 DIAGNOSIS — H2511 Age-related nuclear cataract, right eye: Secondary | ICD-10-CM | POA: Diagnosis not present

## 2021-10-05 ENCOUNTER — Encounter: Payer: Self-pay | Admitting: Internal Medicine

## 2021-10-05 ENCOUNTER — Ambulatory Visit: Payer: Medicare PPO | Admitting: Internal Medicine

## 2021-10-05 VITALS — BP 110/78 | HR 48 | Temp 97.6°F | Wt 156.5 lb

## 2021-10-05 DIAGNOSIS — E785 Hyperlipidemia, unspecified: Secondary | ICD-10-CM

## 2021-10-05 DIAGNOSIS — I4891 Unspecified atrial fibrillation: Secondary | ICD-10-CM

## 2021-10-05 DIAGNOSIS — E559 Vitamin D deficiency, unspecified: Secondary | ICD-10-CM

## 2021-10-05 DIAGNOSIS — Z87440 Personal history of urinary (tract) infections: Secondary | ICD-10-CM | POA: Diagnosis not present

## 2021-10-05 DIAGNOSIS — I10 Essential (primary) hypertension: Secondary | ICD-10-CM | POA: Diagnosis not present

## 2021-10-05 DIAGNOSIS — I272 Pulmonary hypertension, unspecified: Secondary | ICD-10-CM | POA: Diagnosis not present

## 2021-10-05 DIAGNOSIS — J849 Interstitial pulmonary disease, unspecified: Secondary | ICD-10-CM | POA: Diagnosis not present

## 2021-10-05 DIAGNOSIS — I700 Atherosclerosis of aorta: Secondary | ICD-10-CM

## 2021-10-05 DIAGNOSIS — K219 Gastro-esophageal reflux disease without esophagitis: Secondary | ICD-10-CM

## 2021-10-05 DIAGNOSIS — M351 Other overlap syndromes: Secondary | ICD-10-CM | POA: Diagnosis not present

## 2021-10-05 LAB — COMPREHENSIVE METABOLIC PANEL
ALT: 7 U/L (ref 0–35)
AST: 22 U/L (ref 0–37)
Albumin: 4.4 g/dL (ref 3.5–5.2)
Alkaline Phosphatase: 80 U/L (ref 39–117)
BUN: 16 mg/dL (ref 6–23)
CO2: 27 mEq/L (ref 19–32)
Calcium: 10.1 mg/dL (ref 8.4–10.5)
Chloride: 100 mEq/L (ref 96–112)
Creatinine, Ser: 0.91 mg/dL (ref 0.40–1.20)
GFR: 58.84 mL/min — ABNORMAL LOW (ref >60.00)
Glucose, Bld: 87 mg/dL (ref 70–99)
Potassium: 4 mEq/L (ref 3.5–5.1)
Sodium: 139 mEq/L (ref 135–145)
Total Bilirubin: 0.6 mg/dL (ref 0.2–1.2)
Total Protein: 8.5 g/dL — ABNORMAL HIGH (ref 6.0–8.3)

## 2021-10-05 LAB — URINALYSIS, ROUTINE W REFLEX MICROSCOPIC
Bilirubin Urine: NEGATIVE
Hgb urine dipstick: NEGATIVE
Ketones, ur: NEGATIVE
Nitrite: NEGATIVE
RBC / HPF: NONE SEEN (ref 0–?)
Specific Gravity, Urine: <=1.005 — AB (ref 1.000–1.030)
Total Protein, Urine: NEGATIVE
Urine Glucose: NEGATIVE
Urobilinogen, UA: 0.2 (ref 0.0–1.0)
pH: 6 (ref 5.0–8.0)

## 2021-10-05 LAB — POCT URINALYSIS DIPSTICK
Bilirubin, UA: NEGATIVE
Blood, UA: NEGATIVE
Glucose, UA: NEGATIVE
Ketones, UA: NEGATIVE
Nitrite, UA: NEGATIVE
Protein, UA: NEGATIVE
Spec Grav, UA: 1.015 (ref 1.010–1.025)
Urobilinogen, UA: 0.2 E.U./dL
pH, UA: 6 (ref 5.0–8.0)

## 2021-10-05 LAB — LIPID PANEL
Cholesterol: 196 mg/dL (ref 0–200)
HDL: 48.3 mg/dL (ref >39.00)
LDL Cholesterol: 128 mg/dL — ABNORMAL HIGH (ref 0–99)
NonHDL: 147.62
Total CHOL/HDL Ratio: 4
Triglycerides: 100 mg/dL (ref 0.0–149.0)
VLDL: 20 mg/dL (ref 0.0–40.0)

## 2021-10-05 LAB — VITAMIN D 25 HYDROXY (VIT D DEFICIENCY, FRACTURES): VITD: 50.97 ng/mL (ref 30.00–100.00)

## 2021-10-05 MED ORDER — FUROSEMIDE 40 MG PO TABS: 40.0000 mg | ORAL_TABLET | Freq: Every day | ORAL | 1 refills | Status: DC

## 2021-10-05 MED ORDER — PANTOPRAZOLE SODIUM 40 MG PO TBEC: 40.0000 mg | DELAYED_RELEASE_TABLET | Freq: Every day | ORAL | 1 refills | Status: DC

## 2021-10-05 NOTE — Progress Notes (Signed)
Established Patient Office Visit     This visit occurred during the SARS-CoV-2 public health emergency.  Safety protocols were in place, including screening questions prior to the visit, additional usage of staff PPE, and extensive cleaning of exam room while observing appropriate contact time as indicated for disinfecting solutions.    CC/Reason for Visit: 87-month follow-up chronic medical conditions  HPI: Sabrina Douglas is a 81 y.o. female who is coming in today for the above mentioned reasons. Past Medical History is significant for: Well-controlled hypertension and hyperlipidemia, history of A. fib that is rate controlled followed by cardiology.  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 [name] and Dr. [name].  Patient and daughter would like her urine checked as she has been feeling a little weak and last time she was found to have a UTI when that occurred.  She had her COVID booster in September.  She is due to recheck cholesterol vitamin D today.  She is feeling relatively well.  She continues to have dyspnea on exertion but that is at baseline.   Past Medical/Surgical History: Past Medical History:  Diagnosis Date   Anxiety    CHF (congestive heart failure) ([place])    Depression    Hyperlipidemia     Past Surgical History:  Procedure Laterality Date   BALLOON DILATION N/A 10/04/2018   Procedure: BALLOON DILATION;  Surgeon: Otis Brace, MD;  [facility];  Service: Gastroenterology;  Laterality: N/A;   BIOPSY  10/04/2018   Procedure: BIOPSY;  Surgeon: Otis Brace, MD;  [facility]: [facility];  Service: Gastroenterology;;   CATARACT EXTRACTION     ESOPHAGOGASTRODUODENOSCOPY (EGD) WITH PROPOFOL N/A 10/04/2018   Procedure: ESOPHAGOGASTRODUODENOSCOPY (EGD) WITH PROPOFOL;  Surgeon: Otis Brace, MD;  Location: [facility];  Service: Gastroenterology;   Laterality: N/A;   RIGHT HEART CATH N/A 10/15/2018   Procedure: RIGHT HEART CATH;  Surgeon: Jolaine Artist, MD;  Location: [facility];  Service: Cardiovascular;  Laterality: N/A;    Social History:  reports that she has never smoked. She has been exposed to tobacco smoke. She has never used smokeless tobacco. She reports that she does not drink alcohol. No history on file for drug use.  Allergies: No Known Allergies  Family History:  Family History  Problem Relation Age of Onset   Sudden death Mother 67       sudden death post hysterectomy   Prostate cancer Father    Diabetes Father      Current Outpatient Medications:    apixaban (ELIQUIS) 5 MG TABS tablet, TAKE 1 TABLET(5 MG) BY MOUTH TWICE DAILY, Disp: 60 tablet, Rfl: 11   Glycerin-Hypromellose-PEG 400 0.2-0.2-1 % SOLN, Place 2 drops into both eyes as needed (Dry eyes)., Disp: , Rfl:    metoprolol succinate (TOPROL-XL) 12.5 mg TB24 24 hr tablet, Take 12.5 mg by mouth. Half tab daily as needed, Disp: , Rfl:    moxifloxacin (VIGAMOX) 0.5 % ophthalmic solution, Place 1 drop into the left eye 4 (four) times daily., Disp: , Rfl:    Multiple Vitamin (MULTIVITAMIN WITH MINERALS) TABS tablet, Take 1 tablet by mouth daily., Disp: , Rfl:    mycophenolate (CELLCEPT) 500 MG tablet, Take by mouth 2 (two) times daily., Disp: , Rfl:    OXYGEN, Inhale into the lungs. As needed, Disp: , Rfl:    prednisoLONE acetate (PRED FORTE) 1 % ophthalmic suspension, Place 1 drop into the  right eye 4 (four) times daily., Disp: , Rfl:    furosemide (LASIX) 40 MG tablet, Take 1 tablet (40 mg total) by mouth daily., Disp: 90 tablet, Rfl: 1   pantoprazole (PROTONIX) 40 MG tablet, Take 1 tablet (40 mg total) by mouth daily., Disp: 90 tablet, Rfl: 1  Review of Systems:  Constitutional: Denies fever, chills, diaphoresis, appetite change.  HEENT: Denies photophobia, eye pain, redness, hearing loss, ear pain, congestion, sore throat, rhinorrhea,  sneezing, mouth sores, trouble swallowing, neck pain, neck stiffness and tinnitus.   Respiratory: Denies  cough, chest tightness,  and wheezing.   Cardiovascular: Denies chest pain, palpitations and leg swelling.  Gastrointestinal: Denies nausea, vomiting, abdominal pain, diarrhea, constipation, blood in stool and abdominal distention.  Genitourinary: Denies dysuria, urgency, frequency, hematuria, flank pain and difficulty urinating.  Endocrine: Denies: hot or cold intolerance, sweats, changes in hair or nails, polyuria, polydipsia. Musculoskeletal: Denies myalgias, back pain, joint swelling, arthralgias and gait problem.  Skin: Denies pallor, rash and wound.  Neurological: Denies dizziness, seizures, syncope,  light-headedness, numbness and headaches.  Hematological: Denies adenopathy. Easy bruising, personal or family bleeding history  Psychiatric/Behavioral: Denies suicidal ideation, mood changes, confusion, nervousness, sleep disturbance and agitation    Physical Exam: Vitals:   10/05/21 0857  BP: 110/78  Pulse: (!) 48  Temp: 97.6 F (36.4 C)  TempSrc: Oral  SpO2: 98%  Weight: 156 lb 8 oz (71 kg)    Body mass index is 26.86 kg/m.   Constitutional: NAD, calm, comfortable Eyes: PERRL, lids and conjunctivae normal, wears corrective lenses ENMT: Mucous membranes are moist.  Respiratory: clear to auscultation bilaterally, no wheezing, no crackles. Normal respiratory effort. No accessory muscle use.  Cardiovascular: Regular rate and rhythm, no murmurs / rubs / gallops.  Trace bilateral lower extremity edema Psychiatric: Normal judgment and insight. Alert and oriented x 3. Normal mood.    Impression and Plan:  Hx: UTI (urinary tract infection)  - Plan: POCT urinalysis dipstick in office is negative for signs of infection.  Gastroesophageal reflux disease without esophagitis  - Plan: pantoprazole (PROTONIX) 40 MG tablet -Well-controlled on daily PPI therapy.  MCTD (mixed  connective tissue disease) (HCC) ILD (interstitial lung disease) ([place]) Pulmonary hypertension, unspecified ([place])  - Plan: furosemide (LASIX) 40 MG tablet -Followed by pulmonary and rheumatology, on CellCept.  Essential hypertension  - Plan: Comprehensive metabolic panel -Blood pressures well controlled today.  Atrial fibrillation ([place]) -On Eliquis, followed by cardiology  Vitamin D deficiency  - Plan: VITAMIN D 25 Hydroxy (Vit-D Deficiency, Fractures)  Hyperlipidemia, unspecified hyperlipidemia type Aortic atherosclerosis ([place])  - Plan: Lipid panel  Time spent: 33 minutes reviewing chart, interviewing and examining patient and formulating plan of care.   Patient Instructions  -Nice seeing you today!!  -Lab work today; will notify you once results are available.  -Schedule follow up in 6 months for your physical.    Lelon Frohlich, MD [facility]

## 2021-10-05 NOTE — Patient Instructions (Signed)
-  Nice seeing you today!!  -Lab work today; will notify you once results are available.  -Schedule follow up in 6 months for your physical. 

## 2021-10-06 LAB — URINE CULTURE
MICRO NUMBER:: 13067527
Result:: NO GROWTH
SPECIMEN QUALITY:: ADEQUATE

## 2021-10-17 DIAGNOSIS — I272 Pulmonary hypertension, unspecified: Secondary | ICD-10-CM | POA: Diagnosis not present

## 2021-10-17 DIAGNOSIS — J849 Interstitial pulmonary disease, unspecified: Secondary | ICD-10-CM | POA: Diagnosis not present

## 2021-11-12 ENCOUNTER — Ambulatory Visit (INDEPENDENT_AMBULATORY_CARE_PROVIDER_SITE_OTHER)
Admission: RE | Admit: 2021-11-12 | Discharge: 2021-11-12 | Disposition: A | Discharge status: Home / Self Care | Payer: Medicare PPO | Source: Ambulatory Visit | Attending: Pulmonary Disease | Admitting: Pulmonary Disease

## 2021-11-12 ENCOUNTER — Encounter: Payer: Self-pay | Admitting: Pulmonary Disease

## 2021-11-12 DIAGNOSIS — J849 Interstitial pulmonary disease, unspecified: Secondary | ICD-10-CM

## 2021-11-12 DIAGNOSIS — J479 Bronchiectasis, uncomplicated: Secondary | ICD-10-CM | POA: Diagnosis not present

## 2021-11-12 DIAGNOSIS — J84112 Idiopathic pulmonary fibrosis: Secondary | ICD-10-CM | POA: Diagnosis not present

## 2021-11-12 DIAGNOSIS — I272 Pulmonary hypertension, unspecified: Secondary | ICD-10-CM

## 2021-11-12 DIAGNOSIS — I700 Atherosclerosis of aorta: Secondary | ICD-10-CM | POA: Diagnosis not present

## 2021-11-12 DIAGNOSIS — J9611 Chronic respiratory failure with hypoxia: Secondary | ICD-10-CM | POA: Diagnosis not present

## 2021-11-17 ENCOUNTER — Other Ambulatory Visit: Payer: Medicare PPO

## 2021-11-17 DIAGNOSIS — I272 Pulmonary hypertension, unspecified: Secondary | ICD-10-CM | POA: Diagnosis not present

## 2021-11-17 DIAGNOSIS — J849 Interstitial pulmonary disease, unspecified: Secondary | ICD-10-CM | POA: Diagnosis not present

## 2021-11-26 ENCOUNTER — Other Ambulatory Visit (HOSPITAL_COMMUNITY): Payer: Self-pay | Admitting: Internal Medicine

## 2021-12-08 DIAGNOSIS — Z6826 Body mass index (BMI) 26.0-26.9, adult: Secondary | ICD-10-CM | POA: Diagnosis not present

## 2021-12-08 DIAGNOSIS — E663 Overweight: Secondary | ICD-10-CM | POA: Diagnosis not present

## 2021-12-08 DIAGNOSIS — I7300 Raynaud's syndrome without gangrene: Secondary | ICD-10-CM | POA: Diagnosis not present

## 2021-12-08 DIAGNOSIS — M351 Other overlap syndromes: Secondary | ICD-10-CM | POA: Diagnosis not present

## 2021-12-08 DIAGNOSIS — J849 Interstitial pulmonary disease, unspecified: Secondary | ICD-10-CM | POA: Diagnosis not present

## 2021-12-08 DIAGNOSIS — I4891 Unspecified atrial fibrillation: Secondary | ICD-10-CM | POA: Diagnosis not present

## 2021-12-08 DIAGNOSIS — I272 Pulmonary hypertension, unspecified: Secondary | ICD-10-CM | POA: Diagnosis not present

## 2021-12-16 ENCOUNTER — Other Ambulatory Visit (HOSPITAL_COMMUNITY): Payer: Self-pay | Admitting: Internal Medicine

## 2021-12-16 ENCOUNTER — Encounter (HOSPITAL_COMMUNITY): Payer: Self-pay | Admitting: Emergency Medicine

## 2021-12-16 ENCOUNTER — Ambulatory Visit (HOSPITAL_COMMUNITY)
Admission: RE | Admit: 2021-12-16 | Discharge: 2021-12-16 | Disposition: A | Discharge status: Home / Self Care | Payer: Medicare PPO | Source: Ambulatory Visit | Attending: Internal Medicine | Admitting: Internal Medicine

## 2021-12-16 ENCOUNTER — Emergency Department (HOSPITAL_COMMUNITY): Payer: Medicare PPO

## 2021-12-16 ENCOUNTER — Ambulatory Visit (HOSPITAL_BASED_OUTPATIENT_CLINIC_OR_DEPARTMENT_OTHER)
Admission: RE | Admit: 2021-12-16 | Discharge: 2021-12-16 | Disposition: A | Discharge status: Home / Self Care | Payer: Medicare PPO | Source: Ambulatory Visit | Attending: Internal Medicine | Admitting: Internal Medicine

## 2021-12-16 ENCOUNTER — Encounter (HOSPITAL_COMMUNITY): Payer: Self-pay | Admitting: Internal Medicine

## 2021-12-16 ENCOUNTER — Telehealth: Payer: Self-pay | Admitting: Cardiology

## 2021-12-16 ENCOUNTER — Other Ambulatory Visit: Payer: Self-pay

## 2021-12-16 ENCOUNTER — Inpatient Hospital Stay (HOSPITAL_COMMUNITY)
Admission: EM | Admit: 2021-12-16 | Discharge: 2021-12-18 | DRG: 243 | Disposition: A | Discharge status: Home / Self Care | Payer: Medicare PPO | Attending: Cardiology | Admitting: Cardiology

## 2021-12-16 VITALS — BP 168/76 | HR 38 | Wt 158.4 lb

## 2021-12-16 DIAGNOSIS — R9431 Abnormal electrocardiogram [ECG] [EKG]: Secondary | ICD-10-CM | POA: Diagnosis not present

## 2021-12-16 DIAGNOSIS — Z7960 Long term (current) use of unspecified immunomodulators and immunosuppressants: Secondary | ICD-10-CM | POA: Diagnosis not present

## 2021-12-16 DIAGNOSIS — J849 Interstitial pulmonary disease, unspecified: Secondary | ICD-10-CM | POA: Diagnosis present

## 2021-12-16 DIAGNOSIS — I4891 Unspecified atrial fibrillation: Secondary | ICD-10-CM | POA: Diagnosis not present

## 2021-12-16 DIAGNOSIS — R079 Chest pain, unspecified: Secondary | ICD-10-CM | POA: Diagnosis present

## 2021-12-16 DIAGNOSIS — Z833 Family history of diabetes mellitus: Secondary | ICD-10-CM | POA: Diagnosis not present

## 2021-12-16 DIAGNOSIS — Z8042 Family history of malignant neoplasm of prostate: Secondary | ICD-10-CM

## 2021-12-16 DIAGNOSIS — I441 Atrioventricular block, second degree: Secondary | ICD-10-CM

## 2021-12-16 DIAGNOSIS — J8410 Pulmonary fibrosis, unspecified: Secondary | ICD-10-CM | POA: Diagnosis not present

## 2021-12-16 DIAGNOSIS — Z9981 Dependence on supplemental oxygen: Secondary | ICD-10-CM | POA: Diagnosis not present

## 2021-12-16 DIAGNOSIS — J9611 Chronic respiratory failure with hypoxia: Secondary | ICD-10-CM | POA: Diagnosis present

## 2021-12-16 DIAGNOSIS — R531 Weakness: Principal | ICD-10-CM

## 2021-12-16 DIAGNOSIS — I272 Pulmonary hypertension, unspecified: Secondary | ICD-10-CM | POA: Diagnosis not present

## 2021-12-16 DIAGNOSIS — I442 Atrioventricular block, complete: Principal | ICD-10-CM | POA: Diagnosis present

## 2021-12-16 DIAGNOSIS — Z7901 Long term (current) use of anticoagulants: Secondary | ICD-10-CM

## 2021-12-16 DIAGNOSIS — I480 Paroxysmal atrial fibrillation: Secondary | ICD-10-CM | POA: Diagnosis not present

## 2021-12-16 DIAGNOSIS — I493 Ventricular premature depolarization: Secondary | ICD-10-CM

## 2021-12-16 DIAGNOSIS — M351 Other overlap syndromes: Secondary | ICD-10-CM | POA: Diagnosis not present

## 2021-12-16 DIAGNOSIS — R1310 Dysphagia, unspecified: Secondary | ICD-10-CM | POA: Insufficient documentation

## 2021-12-16 DIAGNOSIS — R001 Bradycardia, unspecified: Secondary | ICD-10-CM | POA: Diagnosis present

## 2021-12-16 DIAGNOSIS — I10 Essential (primary) hypertension: Secondary | ICD-10-CM | POA: Diagnosis present

## 2021-12-16 DIAGNOSIS — R002 Palpitations: Secondary | ICD-10-CM

## 2021-12-16 DIAGNOSIS — E871 Hypo-osmolality and hyponatremia: Secondary | ICD-10-CM | POA: Insufficient documentation

## 2021-12-16 DIAGNOSIS — K219 Gastro-esophageal reflux disease without esophagitis: Secondary | ICD-10-CM | POA: Diagnosis present

## 2021-12-16 DIAGNOSIS — I110 Hypertensive heart disease with heart failure: Secondary | ICD-10-CM | POA: Insufficient documentation

## 2021-12-16 DIAGNOSIS — M349 Systemic sclerosis, unspecified: Secondary | ICD-10-CM | POA: Diagnosis not present

## 2021-12-16 DIAGNOSIS — Z950 Presence of cardiac pacemaker: Secondary | ICD-10-CM | POA: Diagnosis not present

## 2021-12-16 DIAGNOSIS — E785 Hyperlipidemia, unspecified: Secondary | ICD-10-CM | POA: Diagnosis present

## 2021-12-16 DIAGNOSIS — R0600 Dyspnea, unspecified: Secondary | ICD-10-CM | POA: Diagnosis not present

## 2021-12-16 DIAGNOSIS — I4510 Unspecified right bundle-branch block: Secondary | ICD-10-CM

## 2021-12-16 LAB — COMPREHENSIVE METABOLIC PANEL
ALT: 11 U/L (ref 0–44)
AST: 26 U/L (ref 15–41)
Albumin: 4.3 g/dL (ref 3.5–5.0)
Alkaline Phosphatase: 85 U/L (ref 38–126)
Anion gap: 10 (ref 5–15)
BUN: 13 mg/dL (ref 8–23)
CO2: 25 mmol/L (ref 22–32)
Calcium: 9.9 mg/dL (ref 8.9–10.3)
Chloride: 105 mmol/L (ref 98–111)
Creatinine, Ser: 1 mg/dL (ref 0.44–1.00)
GFR, Estimated: 56 mL/min — ABNORMAL LOW (ref >60)
Glucose, Bld: 115 mg/dL — ABNORMAL HIGH (ref 70–99)
Potassium: 4 mmol/L (ref 3.5–5.1)
Sodium: 140 mmol/L (ref 135–145)
Total Bilirubin: 0.7 mg/dL (ref 0.3–1.2)
Total Protein: 8.9 g/dL — ABNORMAL HIGH (ref 6.5–8.1)

## 2021-12-16 LAB — CBC WITH DIFFERENTIAL/PLATELET
Abs Immature Granulocytes: 0.02 10*3/uL (ref 0.00–0.07)
Basophils Absolute: 0 10*3/uL (ref 0.0–0.1)
Basophils Relative: 1 %
Eosinophils Absolute: 0.1 10*3/uL (ref 0.0–0.5)
Eosinophils Relative: 1 %
HCT: 44 % (ref 36.0–46.0)
Hemoglobin: 13.9 g/dL (ref 12.0–15.0)
Immature Granulocytes: 1 %
Lymphocytes Relative: 24 %
Lymphs Abs: 1 10*3/uL (ref 0.7–4.0)
MCH: 30 pg (ref 26.0–34.0)
MCHC: 31.6 g/dL (ref 30.0–36.0)
MCV: 94.8 fL (ref 80.0–100.0)
Monocytes Absolute: 0.5 10*3/uL (ref 0.1–1.0)
Monocytes Relative: 11 %
Neutro Abs: 2.5 10*3/uL (ref 1.7–7.7)
Neutrophils Relative %: 62 %
Platelets: 279 10*3/uL (ref 150–400)
RBC: 4.64 MIL/uL (ref 3.87–5.11)
RDW: 12.1 % (ref 11.5–15.5)
WBC: 4.1 10*3/uL (ref 4.0–10.5)
nRBC: 0 % (ref 0.0–0.2)

## 2021-12-16 LAB — BRAIN NATRIURETIC PEPTIDE: B Natriuretic Peptide: 162.4 pg/mL — ABNORMAL HIGH (ref 0.0–100.0)

## 2021-12-16 LAB — TSH: TSH: 4.364 u[IU]/mL (ref 0.350–4.500)

## 2021-12-16 LAB — TROPONIN I (HIGH SENSITIVITY)
Troponin I (High Sensitivity): 18 ng/L — ABNORMAL HIGH (ref <18)
Troponin I (High Sensitivity): 36 ng/L — ABNORMAL HIGH (ref <18)

## 2021-12-16 LAB — MAGNESIUM: Magnesium: 2.1 mg/dL (ref 1.7–2.4)

## 2021-12-16 MED ORDER — ACETAMINOPHEN 650 MG RE SUPP: 650.0000 mg | Freq: Four times a day (QID) | RECTAL | Status: DC | PRN

## 2021-12-16 MED ORDER — ATROPINE SULFATE 0.4 MG/ML IV SOLN
0.4000 mg | Freq: Once | INTRAVENOUS | Status: DC | PRN
  Filled 2021-12-16: qty 1

## 2021-12-16 MED ORDER — SODIUM CHLORIDE 0.9% FLUSH
3.0000 mL | Freq: Two times a day (BID) | INTRAVENOUS | Status: DC
  Administered 2021-12-17 – 2021-12-18 (×3): 3 mL via INTRAVENOUS

## 2021-12-16 MED ORDER — ONDANSETRON HCL 4 MG PO TABS: 4.0000 mg | ORAL_TABLET | Freq: Four times a day (QID) | ORAL | Status: DC | PRN

## 2021-12-16 MED ORDER — MELATONIN 3 MG PO TABS: 3.0000 mg | ORAL_TABLET | Freq: Every evening | ORAL | Status: DC | PRN

## 2021-12-16 MED ORDER — ACETAMINOPHEN 325 MG PO TABS
650.0000 mg | ORAL_TABLET | Freq: Four times a day (QID) | ORAL | Status: DC | PRN
  Administered 2021-12-17 – 2021-12-18 (×2): 650 mg via ORAL
  Filled 2021-12-16 (×2): qty 2

## 2021-12-16 MED ORDER — MYCOPHENOLATE MOFETIL 250 MG PO CAPS
500.0000 mg | ORAL_CAPSULE | Freq: Two times a day (BID) | ORAL | Status: DC
Start: 2021-12-17 — End: 2021-12-18
  Administered 2021-12-17 – 2021-12-18 (×3): 500 mg via ORAL
  Filled 2021-12-16 (×4): qty 2

## 2021-12-16 MED ORDER — FUROSEMIDE 40 MG PO TABS
40.0000 mg | ORAL_TABLET | Freq: Every day | ORAL | Status: DC
  Administered 2021-12-17 – 2021-12-18 (×2): 40 mg via ORAL
  Filled 2021-12-16 (×2): qty 1

## 2021-12-16 MED ORDER — PANTOPRAZOLE SODIUM 40 MG PO TBEC
40.0000 mg | DELAYED_RELEASE_TABLET | Freq: Every day | ORAL | Status: DC
  Administered 2021-12-17 – 2021-12-18 (×2): 40 mg via ORAL
  Filled 2021-12-16 (×2): qty 1

## 2021-12-16 MED ORDER — SENNA 8.6 MG PO TABS
1.0000 | ORAL_TABLET | Freq: Two times a day (BID) | ORAL | Status: DC
  Administered 2021-12-17: 8.6 mg via ORAL
  Filled 2021-12-16 (×3): qty 1

## 2021-12-16 MED ORDER — ONDANSETRON HCL 4 MG/2ML IJ SOLN: 4.0000 mg | Freq: Four times a day (QID) | INTRAMUSCULAR | Status: DC | PRN

## 2021-12-16 MED ORDER — POLYVINYL ALCOHOL 1.4 % OP SOLN
1.0000 [drp] | OPHTHALMIC | Status: DC | PRN
  Filled 2021-12-16: qty 15

## 2021-12-16 MED ORDER — ADULT MULTIVITAMIN W/MINERALS CH
1.0000 | ORAL_TABLET | Freq: Every day | ORAL | Status: DC
  Administered 2021-12-17 – 2021-12-18 (×2): 1 via ORAL
  Filled 2021-12-16 (×3): qty 1

## 2021-12-16 MED ORDER — GLYCERIN-HYPROMELLOSE-PEG 400 0.2-0.2-1 % OP SOLN: 2.0000 [drp] | OPHTHALMIC | Status: DC | PRN

## 2021-12-16 MED ORDER — HYDRALAZINE HCL 20 MG/ML IJ SOLN: 10.0000 mg | Freq: Three times a day (TID) | INTRAMUSCULAR | Status: DC | PRN

## 2021-12-16 NOTE — Progress Notes (Signed)
?  ? ? ?      ADVANCED HF CLINIC NOTE ? ?Date:  12/16/2021  ? ?ID:  Sabrina [facility], DOB Nov 20, 1940, MRN [id number]  Location: Home  ?Provider location: [facility] ?Type of Visit: Established patient ? ?PCP:  Isaac Bliss, Rayford Halsted, MD  ?Cardiologist:  None ?Primary HF: Sabrina [facility] ? ?Chief Complaint: Heart Failure follow-up ?  ?History of Present Illness: ? ?Sabrina Douglas is a 81 y.o. female with HTN, HL, anxiety/depression, and ILD due MCTD.  ?  ?Patient seen for initial consult with Dr. [name] on 08/28/18. CXR showed mildly worsened bibasilar pneumonia, suggestive of atypical infection d/t waxing/weaning symptoms. 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  ANA positive, CCP negative. 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  ?  ?Admitted 2/20 with severe weakness. Fount to have hyponatremia at 114, dysphagia and about 20 pounds weight loss in the months. Treated with 3% saline and eventually demeclocycline.  ? ?Pt admitted 3/20 with new onset Afib. Converted back to NSR with amiodarone drip and was discontinued. Toprol resumed. Eliquis started. CT chest in 3/20 no coronary calcification. [place] 3/20 no PAH.  ? ?Follows with Dr. [name] & [name]. 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 Was on plaquenil/prednisone. Prednisone stopped in 10/21. Switched to Cellcept. Hires CT improving.  ?  ?C/o chest tightness. Coronary CT ordered but never completed because she did not want to take b-blocker..  ? ?12/2020 Dr [name] started on mycophenalate. Volume status doing well.   ? ?4/23 Hires CT - no change  ? ?Here for FU. Here with her daughter. Over the past month has been feeling more dizzy and weak at time. Wearing her oxygen more. Saw Dr. [name] last week and no change. No edema, orthopnea or PND.  ? ?Zio 10/12/20 ?1. Sinus rhythm -  avg HR of 78 bpm.  ?2. Bundle Branch Block/IVCD was present.  ?3. 16 runs of SVT occurred, the  run with the fastest interval lasting 7 beats with a max rate of 190 bpm, the longest lasting 8 beats with an avg rate of 114 bpm. ?4. 708 episode(s) of brief nocturnal AV Block (2nd? Mobitz II) occurred,  ?5. Rare PACs and PVCs ?6. Nearly all patient-triggered events associated with sinus rhythm  ?  ? ?Studies: ? ?HiRes CT 10/20 ?1. Bilateral lower lobe predominant bronchiectasis and volume loss ?may be postinfectious or post inflammatory in etiology. Findings are ?suggestive of an alternative diagnosis (not UIP) per consensus ?guidelines: Diagnosis of Idiopathic Pulmonary Fibrosis ? ? ?Zio Patch 11/28/18  ?1. Predominant rhythm was sinus. Patient had a min HR of 54 bpm, max HR of 214 bpm, and avg HR of 96 bpm. ?2. Two brief runs of NSVT - longest was 8 beats ?3. Forty five brief runs of SVT. The run with the fastest interval lasting 5 beats with a max rate of 214 bpm, the longest lasting 15.7 secs with an avg rate of 154 bpm. 4. Rare PVCs ?  ? ?RHC 10/15/18 ?RA = 3 ?RV = 39/4 ?PA = 39/7 (18) ?PCW = 4 ?Fick cardiac output/index = 6.5/3.8 ?PVR = 2.1 WU ?Ao sat = 100% ?PA sat = 81%, 82% ?High SVC and axillary sats both 81% ?  ?Assessment: ? 1. Normal pulmonary pressures ?2. High output physiology without evidence of intracardiac shunting  ?  ?Labs  ?ANA 1:1280 (pos) ?ESR 104  ?RF 55 (pos) ?RNP > 8.0  (pos) ?  AntiJo negative ?CCP < 16 ?Anti-Smith 2.0 (pos) ?Scl-70 < 1.0 ?ANCA negative ?  ?HRCT 09/10/18 ?1. 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 Repeat high-resolution chest CT is ?recommended in 12 months to assess for temporal changes in the ?appearance of the lung parenchyma. ?2. Aortic atherosclerosis. ?  ?Echo 09/30/18 ? 1. The left ventricle has hyperdynamic systolic function, with an ejection fraction of >65%. The cavity size was normal. Left ventricular diastolic parameters were normal No evidence of left  ventricular regional wall motion abnormalities. ? 2. No evidence of left ventricular regional wall motion abnormalities. ? 3. The right ventricle has normal systolic function. The cavity was grossly normal. RA-RV peak gradient increased at 66.9 mmHg consistent with probable severe pulmonary hypertension. Could not estimate CVP however. ? 4. The aortic valve is tricuspid Mild aortic annular calcification noted. ? 5. The mitral valve is normal in structure. ? 6. The tricuspid valve is normal in structure. Tricuspid valve regurgitation is  ?  ?PFTs  (10/02/18) ?FEV1 1.13 (66%) ?FVC   1.23 (56%) ?DLCO 16% ?  ?VQ scan 10/03/18: Negative ? ? ?Past Medical History:  ?Diagnosis Date  ? Anxiety   ? CHF (congestive heart failure) ([place])   ? Depression   ? Hyperlipidemia   ? ?Past Surgical History:  ?Procedure Laterality Date  ? BALLOON DILATION N/A 10/04/2018  ? Procedure: BALLOON DILATION;  Surgeon: Otis Brace, MD;  Location: [facility];  Service: Gastroenterology;  Laterality: N/A;  ? BIOPSY  10/04/2018  ? Procedure: BIOPSY;  Surgeon: Otis Brace, MD;  Location: [facility];  Service: Gastroenterology;;  ? CATARACT EXTRACTION    ? ESOPHAGOGASTRODUODENOSCOPY (EGD) WITH PROPOFOL N/A 10/04/2018  ? Procedure: ESOPHAGOGASTRODUODENOSCOPY (EGD) WITH PROPOFOL;  Surgeon: Otis Brace, MD;  Location: [facility];  Service: Gastroenterology;  Laterality: N/A;  ? RIGHT HEART CATH N/A 10/15/2018  ? Procedure: RIGHT HEART CATH;  Surgeon: Jolaine Artist, MD;  Location: [facility];  Service: Cardiovascular;  Laterality: N/A;  ? ? ? ?Current Outpatient Medications  ?Medication Sig Dispense Refill  ? ELIQUIS 5 MG TABS tablet TAKE 1 TABLET(5 MG) BY MOUTH TWICE DAILY 60 tablet 11  ? furosemide (LASIX) 40 MG tablet Take 1 tablet (40 mg total) by mouth daily. 90 tablet 1  ? Glycerin-Hypromellose-PEG 400 0.2-0.2-1 % SOLN Place 2 drops into both eyes as needed (Dry eyes).    ? metoprolol succinate (TOPROL-XL) 12.5  mg TB24 24 hr tablet 1/2 tablet by mouth daily as needed    ? Multiple Vitamin (MULTIVITAMIN WITH MINERALS) TABS tablet Take 1 tablet by mouth daily.    ? mycophenolate (CELLCEPT) 500 MG tablet Take by mouth 2 (two) times daily.    ? OXYGEN Inhale 1.5 L into the lungs. As needed    ? pantoprazole (PROTONIX) 40 MG tablet Take 1 tablet (40 mg total) by mouth daily. 90 tablet 1  ? VITAMIN D PO Take 1,000 mg by mouth daily.    ? ?No current facility-administered medications for this encounter.  ? ? ?Allergies:   Patient has no known allergies.  ? ?Social History:  The patient  reports that she has never smoked. She has been exposed to tobacco smoke. She has never used smokeless tobacco. She reports that she does not drink alcohol.  ? ?Family History:  The patient's family history includes Diabetes in her father; Prostate cancer in her father; Sudden death (age of onset: 65)  in her mother.  ? ?ROS:  Please see the history of present illness.   All other systems are personally reviewed and negative.  ? ?Vitals:  ? 12/16/21 1114  ?BP: (!) 168/76  ?Pulse: (!) 38  ?SpO2: 99%  ?Weight: 71.8 kg (158 lb 6.4 oz)  ? ?Exam:   ?General:  Elderly. No resp difficulty ?HEENT: normal ?Neck: supple. no JVD. Carotids 2+ bilat; no bruits. No lymphadenopathy or thryomegaly appreciated. ?Cor: PMI nondisplaced. irregular rate & rhythm. No rubs, gallops or murmurs. ?Lungs: clear ?Abdomen: soft, nontender, nondistended. No hepatosplenomegaly. No bruits or masses. Good bowel sounds. ?Extremities: no cyanosis, clubbing, rash, edema ?Neuro: alert & orientedx3, cranial nerves grossly intact. moves all 4 extremities w/o difficulty. Affect pleasant ? ?ECG: sinus with 2nd Degree HB and frequent monomorphic PVCs in pattern bigeminy.Personally reviewed ? ? ?Recent Labs: ?03/30/2021: Hemoglobin 13.3; Platelets 269.0; TSH 2.90 ?10/05/2021: ALT 7; BUN 16; Creatinine, Ser 0.91; Potassium 4.0; Sodium 139  ?Personally reviewed  ? ?Wt Readings from Last 3  Encounters:  ?12/16/21 71.8 kg (158 lb 6.4 oz)  ?10/05/21 71 kg (156 lb 8 oz)  ?08/11/21 71.4 kg (157 lb 7.5 oz)  ?  ? ?ASSESSMENT AND PLAN: ? ?1. Frequent PVCs ?- place Zio to quantify ? ?2. 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

## 2021-12-16 NOTE — ED Provider Triage Note (Signed)
Emergency Medicine Provider Triage Evaluation Note ? ?Sabrina Douglas , a 81 y.o. female  was evaluated in triage.  Patient had Zio patch placed today for concern for heart block.  Patient received call requesting she come to the emergency room for evaluation due to evidence of complete heart block.  Endorses intermittent chest pain, shortness of breath, and lightheadedness.  Denies syncope.  ? ?Review of Systems  ?Positive: As above ?Negative: As above ? ?Physical Exam  ?BP (!) 209/64   Pulse (!) 38   Temp (!) 97.5 ?F (36.4 ?C) (Oral)   Resp 18   Wt 71.9 kg   SpO2 100%   BMI 27.22 kg/m?  ?Gen:   Awake, no distress   ?Resp:  Normal effort  ?MSK:   Moves extremities without difficulty  ?Other:   ? ?Medical Decision Making  ?Medically screening exam initiated at 8:17 PM.  Appropriate orders placed.  Sabrina Douglas was informed that the remainder of the evaluation will be completed by another provider, this initial triage assessment does not replace that evaluation, and the importance of remaining in the ED until their evaluation is complete. ? ?Nursing aware patient needs to be roomed ASAP.  Working on bed.  ?  Evlyn Courier, PA-C ?12/16/21 2023 ? ?

## 2021-12-16 NOTE — ED Triage Notes (Signed)
Presents from cardiac office where she was called in because her recently placed heart monitor noted a complete heart block. Arrives POV alert, oriented x3, sob (baseline from interstitial lung disease), and weak. Has been feeling weak for months. ? ?Takes eliquis for afib, no missed doses. ? ?EKG in triage reflects HR 60 but was in 30s at office.  ? ?Denies fever, abd pain, CP.  ?

## 2021-12-16 NOTE — ED Notes (Signed)
Triage EDP at bedside ?

## 2021-12-16 NOTE — Telephone Encounter (Signed)
Monitoring service called with more runs of CHB HR 44.  They will continue to call until pt arrives at hospital.  Then we can hold.  Pt has not yet arrived here - but she had gone to bed when I called so would take a few min.  ?

## 2021-12-16 NOTE — Telephone Encounter (Signed)
Monitoring service call and pt with several episodes of CHB. HR in 40s still with occ PVCs, she has not had BB today.  I talked to pt and her daughter and explained that she needed to come to ER due to the slow rate and chance it could be slower.  Dr. Gala Romney had mentioned PPM.  The daughter agreed to bring her mom.  I notified ER Triage pt would be coming in.  ?

## 2021-12-16 NOTE — H&P (Signed)
?Cardiology Admission History and Physical:  ? ?Patient ID: Sabrina Douglas ?MRN: [id number]; DOB: July 02, 1941  ? ?Admission date: 12/16/2021 ? ?PCP:  Isaac Bliss, Rayford Halsted, MD ?  ?[facility] Providers ?Cardiologist:  Dr. Haroldine Laws  ? ?Chief Complaint: weakness ? ?Patient Profile:  ? ?Sabrina [facility] is a 81 y.o. 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 ? ?History of Present Illness:  ? ?Sabrina Douglas was seen by Dr. Mahalia Longest today to establish care. She was placed Ziopatch today to assess burden of arrhythmia. Of note, "Monitoring service call and pt with several episodes of CHB. HR in 40s still with occ PVCs", patient was asked to come to the hospital for further evaluation. ? ?Patient is seen at bedside with her two daughters. Patient did not feel anything different today. She has chronic weakness, dyspnea and fatigue. She says she started feeling more lightheaded, dizzy, generalized weakness for 2-3 weeks. She also experiences periodic worsening shortness of breath followed by upper abdominal discomfort. Denies fever, chills, syncope, cough,  heart palpitations,nausea, vomiting, PND, early satiety, abdominal fullness, dysuria, diarrhea, pedal edema or any bleeding events. ? ?She says she took all her medications today except for eliquis night dose. Patient reports her last time symptomatic atrial fibrillation was at least a year ago. She has not taken metoprolol for at least one year. She denies hx of tobacco and alcohol abuse. ? ?On admission, ?Cr 1.0, K 4.0, Mg 2.1, BNP 162.4, troponin 18, WBC 4.1, Hgb 13.9 ?CXR no active disease ?ECGs demonstrated sinus rhythm, ventricular bigeminy, 2:1 AV block  ? ?Past Medical History:  ?Diagnosis Date  ? Anxiety   ? CHF (congestive heart failure) ([place])   ?  Depression   ? Hyperlipidemia   ? ? ?Past Surgical History:  ?Procedure Laterality Date  ? BALLOON DILATION N/A 10/04/2018  ? Procedure: BALLOON DILATION;  Surgeon: Otis Brace, MD;  Location: [facility];  Service: Gastroenterology;  Laterality: N/A;  ? BIOPSY  10/04/2018  ? Procedure: BIOPSY;  Surgeon: Otis Brace, MD;  Location: [facility];  Service: Gastroenterology;;  ? CATARACT EXTRACTION    ? ESOPHAGOGASTRODUODENOSCOPY (EGD) WITH PROPOFOL N/A 10/04/2018  ? Procedure: ESOPHAGOGASTRODUODENOSCOPY (EGD) WITH PROPOFOL;  Surgeon: Otis Brace, MD;  Location: [facility];  Service: Gastroenterology;  Laterality: N/A;  ? RIGHT HEART CATH N/A 10/15/2018  ? Procedure: RIGHT HEART CATH;  Surgeon: Jolaine Artist, MD;  Location: [facility];  Service: Cardiovascular;  Laterality: N/A;  ?  ? ?Medications Prior to Admission: ?Prior to Admission medications   ?Medication Sig Start Date End Date Taking? Authorizing Provider  ?ELIQUIS 5 MG TABS tablet TAKE 1 TABLET(5 MG) BY MOUTH TWICE DAILY 11/26/21   Bensimhon, Shaune Pascal, MD  ?furosemide (LASIX) 40 MG tablet Take 1 tablet (40 mg total) by mouth daily. 10/05/21   Isaac Bliss, Rayford Halsted, MD  ?Glycerin-Hypromellose-PEG 400 0.2-0.2-1 % SOLN Place 2 drops into both eyes as needed (Dry eyes).    [provider]  ?metoprolol succinate (TOPROL-XL) 12.5 mg TB24 24 hr tablet 1/2 tablet by mouth daily as needed    [provider]  ?Multiple Vitamin (MULTIVITAMIN WITH MINERALS) TABS tablet Take 1 tablet by mouth daily.    [provider]  ?mycophenolate (CELLCEPT) 500 MG tablet Take by mouth 2 (two) times daily.    [provider]  ?OXYGEN Inhale 1.5 L into the lungs. As needed    [provider]  ?pantoprazole (PROTONIX) 40 MG tablet Take 1 tablet (40 mg total) by mouth daily. 10/05/21   Isaac Bliss, Rayford Halsted, MD  ?VITAMIN D PO Take 1,000 mg by mouth daily.    [provider]  ?  ? ?Allergies:    No Known Allergies ? ?Social History:   ?Social History  ? ?Socioeconomic History  ? Marital status: Widowed  ?  Spouse name: Not on file  ? Number of children: Not on file  ? Years of education: Not on file  ? Highest education level: 12th grade  ?Occupational History  ? Not on file  ?Tobacco Use  ? Smoking status: Never  ?  Passive exposure: Yes  ? Smokeless tobacco: Never  ?Vaping Use  ? Vaping Use: Never used  ?Substance and Sexual Activity  ? Alcohol use: Never  ? Drug use: Not on file  ? Sexual activity: Not on file  ?Other Topics Concern  ? Not on file  ?Social History Narrative  ? Not on file  ? ?Social Determinants of Health  ? ?Financial Resource Strain: Low Risk   ? Difficulty of Paying Living Expenses: Not hard at all  ?Food Insecurity: No Food Insecurity  ? Worried About Charity fundraiser in the Last Year: Never true  ? Ran Out of Food in the Last Year: Never true  ?Transportation Needs: No Transportation Needs  ? Lack of Transportation (Medical): No  ? Lack of Transportation (Non-Medical): No  ?Physical Activity: Unknown  ? Days of Exercise per Week: 0 days  ? Minutes of Exercise per Session: Not on file  ?Stress: Stress Concern Present  ? Feeling of Stress : To some extent  ?Social Connections: Moderately Isolated  ? Frequency of Communication with Friends and Family: More than three times a week  ? Frequency of Social Gatherings with Friends and Family: Once a week  ? Attends Religious Services: Never  ? Active Member of Clubs or Organizations: Yes  ? Attends Archivist Meetings: Patient refused  ? Marital Status: Widowed  ?Intimate Partner Violence: Not on file  ?  ?Family History:   ?The patient's family history includes Diabetes in her father; Prostate cancer in her father; Sudden death (age of onset: 39) in her mother.   ? ?ROS:  ?Please see the history of present illness.  ?All other ROS reviewed and negative.    ? ?Physical Exam/Data:  ? ?Vitals:  ? 12/16/21 2009 12/16/21 2017  ?BP:  (!) 209/64   ?Pulse: (!) 38   ?Resp: 18   ?Temp: (!) 97.5 ?F (36.4 ?C)   ?TempSrc: Oral   ?SpO2: 100%   ?Weight:  71.9 kg  ? ?No intake or output data in the 24 hours ending 12/16/21 2131 ? ?  12/16/2021  ?  8:17 PM 12/16/2021  ? 11:14 AM 10/05/2021  ?  8:57 AM  ?Last 3 Weights  ?Weight (lbs) 158 lb 9.6 oz 158 lb 6.4 oz 156 lb 8 oz  ?Weight (kg) 71.94 kg 71.85 kg 70.988 kg  ?   ?Body mass index is 27.22 kg/m?.  ?General:  Well nourished, well developed, in no acute distress ?HEENT: normal ?Neck: no JVD ?Vascular: No carotid bruits; Distal pulses 2+ bilaterally   ?Cardiac:  normal S1, S2; RRR; no murmur  ?Lungs:  clear to auscultation bilaterally, no wheezing, rhonchi or rales  ?Abd: soft, nontender, no hepatomegaly  ?  Ext: trace pitting edema b/l ?Musculoskeletal:  No deformities, BUE and BLE strength normal and equal ?Skin: warm and dry  ?Neuro:  CNs 2-12 intact, no focal abnormalities noted ?Psych:  Normal affect  ? ? ?Relevant CV Studies: ? ? ?Laboratory Data: ? ?High Sensitivity Troponin:   ?Recent Labs  ?Lab 12/16/21 ?2032  ?TROPONINIHS 18*  ?    ?Chemistry ?Recent Labs  ?Lab 12/16/21 ?2032  ?NA 140  ?K 4.0  ?CL 105  ?CO2 25  ?GLUCOSE 115*  ?BUN 13  ?CREATININE 1.00  ?CALCIUM 9.9  ?MG 2.1  ?GFRNONAA 56*  ?ANIONGAP 10  ?  ?Recent Labs  ?Lab 12/16/21 ?2032  ?PROT 8.9*  ?ALBUMIN 4.3  ?AST 26  ?ALT 11  ?ALKPHOS 85  ?BILITOT 0.7  ? ?Lipids No results for input(s): CHOL, TRIG, HDL, LABVLDL, LDLCALC, CHOLHDL in the last 168 hours. ?Hematology ?Recent Labs  ?Lab 12/16/21 ?2032  ?WBC 4.1  ?RBC 4.64  ?HGB 13.9  ?HCT 44.0  ?MCV 94.8  ?MCH 30.0  ?MCHC 31.6  ?RDW 12.1  ?PLT 279  ? ?Thyroid No results for input(s): TSH, FREET4 in the last 168 hours. ?BNP ?Recent Labs  ?Lab 12/16/21 ?2032  ?BNP 162.4*  ?  ?DDimer No results for input(s): DDIMER in the last 168 hours. ? ? ?Radiology/Studies:  ?DG Chest Portable 1 View ? ?Result Date: 12/16/2021 ?CLINICAL DATA:  Dyspnea EXAM: PORTABLE CHEST 1 VIEW COMPARISON:  05/19/2020 FINDINGS:  The heart size and mediastinal contours are within normal limits. Both lungs are clear. The visualized skeletal structures are unremarkable. Cardiac monitor noted. IMPRESSION: No active disease. Electroni

## 2021-12-16 NOTE — ED Provider Notes (Signed)
?[facility] ?Provider Note ? ? ?CSN: [id number] ?Arrival date & time: 12/16/21  2001 ? ?  ? ?History ? ?Chief Complaint  ?Patient presents with  ? Weakness  ? ? ? ? ?Weakness ?Associated symptoms: shortness of breath (On exertion)   ?Associated symptoms: no abdominal pain, no chest pain, no cough, no fever, no nausea and no vomiting   ?Sabrina ResidesMary Catherine Douglas is a 81 y.o. female with a history of HTN, pulmonary hypertension, atrial fibrillation, interstitial lung disease who presented to the ED with concerns for an abnormal heart rate.  Patient states she has been suffering from generalized fatigue and weakness for the past month.  She has also had intermittent chest pain, shortness of breath, and lightheadedness over the past month.  She is currently being worked up for this by her cardiologist and a Zio patch was placed today.  She received a call this evening that the Zio patch noted an abnormal heart rhythm and instructed her to come to the ED immediately for further evaluation.  Currently, patient denies any chest pain or shortness of breath.  She does continue to endorse fatigue, dyspnea on exertion, and generalized weakness. ? ?  ? ?Home Medications ?Prior to Admission medications   ?Medication Sig Start Date End Date Taking? Authorizing Provider  ?acetaminophen (TYLENOL) 325 MG tablet Take 325 mg by mouth every 6 (six) hours as needed for moderate pain or headache.   Yes [provider]  ?ELIQUIS 5 MG TABS tablet TAKE 1 TABLET(5 MG) BY MOUTH TWICE DAILY ?Patient taking differently: Take 5 mg by mouth 2 (two) times daily. 11/26/21  Yes Bensimhon, Bevelyn Bucklesaniel R, MD  ?fexofenadine (ALLEGRA) 180 MG tablet Take 180 mg by mouth daily as needed for allergies or rhinitis.   Yes [provider]  ?furosemide (LASIX) 40 MG tablet Take 1 tablet (40 mg total) by mouth daily. ?Patient taking differently: Take 40 mg by mouth daily. 1:30 pm or 2 pm 10/05/21  Yes Philip AspenHernandez  Acosta, Limmie PatriciaEstela Y, MD  ?Multiple Vitamin (MULTIVITAMIN WITH MINERALS) TABS tablet Take 1 tablet by mouth daily.   Yes [provider]  ?mycophenolate (CELLCEPT) 500 MG tablet Take 500 mg by mouth 2 (two) times daily.   Yes [provider]  ?OXYGEN Inhale 1.5 L into the lungs See admin instructions. At bedtime  ?During the day as needed for shortness of breath   Yes [provider]  ?pantoprazole (PROTONIX) 40 MG tablet Take 1 tablet (40 mg total) by mouth daily. 10/05/21  Yes Philip AspenHernandez Acosta, Limmie PatriciaEstela Y, MD  ?RESTASIS 0.05 % ophthalmic emulsion Place 1 drop into both eyes 2 (two) times daily. 8:30 am and 6:30 pm 09/24/21  Yes [provider]  ?SYSTANE ULTRA 0.4-0.3 % SOLN Apply 1 drop to eye 2 (two) times daily. 12:30 pm and 8 pm 09/24/21  Yes [provider]  ?VITAMIN D PO Take 1,000 mg by mouth daily.   Yes [provider]  ?Glycerin-Hypromellose-PEG 400 0.2-0.2-1 % SOLN Place 2 drops into both eyes daily. ?Patient not taking: Reported on 12/16/2021    [provider]  ?metoprolol succinate (TOPROL-XL) 12.5 mg TB24 24 hr tablet 1/2 tablet by mouth daily as needed ?Patient not taking: Reported on 12/16/2021    [provider]  ?   ? ?Allergies    ?Patient has no known allergies.   ? ?Review of Systems   ?Review of Systems  ?Constitutional:  Positive for fatigue. Negative for fever.  ?Respiratory:  Positive for shortness of breath (On exertion). Negative for cough.   ?Cardiovascular:  Positive for leg swelling. Negative for chest pain.  ?Gastrointestinal:  Negative for abdominal pain, nausea and vomiting.  ?Neurological:  Positive for weakness. Negative for syncope.  ? ?Physical Exam ?Updated Vital Signs ?BP (!) 154/51   Pulse (!) 49   Temp 97.9 ?F (36.6 ?C) (Oral)   Resp 18   Wt 71.9 kg   SpO2 100%   BMI 27.22 kg/m?  ?Physical Exam ?Constitutional:   ?   General: She is not in acute distress. ?   Appearance: She is not toxic-appearing or  diaphoretic.  ?   Comments: Elderly and chronically ill appearing.  ?HENT:  ?   Head: Normocephalic and atraumatic.  ?   Right Ear: External ear normal.  ?   Left Ear: External ear normal.  ?   Nose: Nose normal.  ?   Mouth/Throat:  ?   Mouth: Mucous membranes are moist.  ?   Pharynx: Oropharynx is clear.  ?Eyes:  ?   General: No scleral icterus. ?Cardiovascular:  ?   Rate and Rhythm: Normal rate. Rhythm irregular.  ?   Heart sounds: Normal heart sounds. No murmur heard. ?  No friction rub. No gallop.  ?Pulmonary:  ?   Effort: No respiratory distress.  ?   Breath sounds: Normal breath sounds. No stridor. No wheezing, rhonchi or rales.  ?Abdominal:  ?   General: There is no distension.  ?   Tenderness: There is no abdominal tenderness. There is no guarding or rebound.  ?Musculoskeletal:     ?   General: No deformity.  ?   Cervical back: Neck supple.  ?   Right lower leg: Edema (trace) present.  ?   Left lower leg: Edema (trace) present.  ?Skin: ?   General: Skin is warm and dry.  ?Neurological:  ?   General: No focal deficit present.  ?   Mental Status: She is alert and oriented to person, place, and time.  ? ? ?ED Results / Procedures / Treatments   ?Labs ?(all labs ordered are listed, but only abnormal results are displayed) ?Labs Reviewed  ?COMPREHENSIVE METABOLIC PANEL - Abnormal; Notable for the following components:  ?    Result Value  ? Glucose, Bld 115 (*)   ? Total Protein 8.9 (*)   ? GFR, Estimated 56 (*)   ? All other components within normal limits  ?BRAIN NATRIURETIC PEPTIDE - Abnormal; Notable for the following components:  ? B Natriuretic Peptide 162.4 (*)   ? All other components within normal limits  ?TROPONIN I (HIGH SENSITIVITY) - Abnormal; Notable for the following components:  ? Troponin I (High Sensitivity) 18 (*)   ? All other components within normal limits  ?TROPONIN I (HIGH SENSITIVITY) - Abnormal; Notable for the following components:  ? Troponin I (High Sensitivity) 36 (*)   ? All other  components within normal limits  ?CBC WITH DIFFERENTIAL/PLATELET  ?MAGNESIUM  ?TSH  ?URINALYSIS, ROUTINE W REFLEX MICROSCOPIC  ? ? ?EKG ?EKG Interpretation ? ?Date/Time:  Thursday Dec 16 2021 20:14:41 EDT ?Ventricular Rate:  60 ?PR Interval:    ?QRS Duration: 140 ?QT Interval:  434 ?QTC Calculation: 434 ?R Axis:   102 ?Text Interpretation: Undetermined rhythm Right bundle branch block Abnormal ECG When compared with ECG of 16-Dec-2021 11:11, PREVIOUS ECG IS PRESENT Confirmed by Alona Bene [id number]) on 12/16/2021 8:42:45 PM ? ?Radiology ?DG Chest Portable 1 View ? ?Result Date: 12/16/2021 ?  CLINICAL DATA:  Dyspnea EXAM: PORTABLE CHEST 1 VIEW COMPARISON:  05/19/2020 FINDINGS: The heart size and mediastinal contours are within normal limits. Both lungs are clear. The visualized skeletal structures are unremarkable. Cardiac monitor noted. IMPRESSION: No active disease. Electronically Signed   By: Helyn Numbers M.D.   On: 12/16/2021 21:03   ? ?Procedures ?Procedures  ? ? ?Medications Ordered in ED ?Medications  ?furosemide (LASIX) tablet 40 mg (has no administration in time range)  ?pantoprazole (PROTONIX) EC tablet 40 mg (has no administration in time range)  ?mycophenolate (CELLCEPT) capsule 500 mg (has no administration in time range)  ?multivitamin with minerals tablet 1 tablet (1 tablet Oral Not Given 12/16/21 2333)  ?sodium chloride flush (NS) 0.9 % injection 3 mL (has no administration in time range)  ?senna (SENOKOT) tablet 8.6 mg (8.6 mg Oral Not Given 12/16/21 2332)  ?acetaminophen (TYLENOL) tablet 650 mg (has no administration in time range)  ?  Or  ?acetaminophen (TYLENOL) suppository 650 mg (has no administration in time range)  ?ondansetron (ZOFRAN) tablet 4 mg (has no administration in time range)  ?  Or  ?ondansetron (ZOFRAN) injection 4 mg (has no administration in time range)  ?melatonin tablet 3 mg (has no administration in time range)  ?hydrALAZINE (APRESOLINE) injection 10 mg (has no administration in  time range)  ?atropine injection 0.4 mg (has no administration in time range)  ?polyvinyl alcohol (LIQUIFILM TEARS) 1.4 % ophthalmic solution 1 drop (has no administration in time range)  ? ? ?ED Course/ Medical Decision M

## 2021-12-16 NOTE — Patient Instructions (Signed)
Your provider has recommended that  you wear a Zio Patch for 14 days.  This monitor will record your heart rhythm for our review.  IF you have any symptoms while wearing the monitor please press the button.  If you have any issues with the patch or you notice a red or orange light on it please call the company at [phone].  Once you remove the patch please mail it back to the company as soon as possible so we can get the results. ? ?Your physician has requested that you regularly monitor and record your blood pressure readings at home. Please use the same machine at the same time of day to check your readings and record them to bring to your follow-up visit. ? ?Your physician recommends that you schedule a follow-up appointment in: 4 months ? ?If you have any questions or concerns before your next appointment please send [place] a message through [place] or call our office at [phone].   ? ?TO LEAVE A MESSAGE FOR THE NURSE SELECT OPTION 2, PLEASE LEAVE A MESSAGE INCLUDING: ?YOUR NAME ?DATE OF BIRTH ?CALL BACK NUMBER ?REASON FOR CALL**this is important as we prioritize the call backs ? ?YOU WILL RECEIVE A CALL BACK THE SAME DAY AS LONG AS YOU CALL BEFORE 4:00 PM ? ?At the [facility], you and your health needs are our priority. As part of our continuing mission to provide you with exceptional heart care, we have created designated Provider Care Teams. These Care Teams include your primary Cardiologist (physician) and Advanced Practice Providers (APPs- Physician Assistants and Nurse Practitioners) who all work together to provide you with the care you need, when you need it.  ? ?You may see any of the following providers on your designated Care Team at your next follow up: ?Dr Glori Bickers ?Dr Loralie Champagne ?Darrick Grinder, NP ?Lyda Jester, PA ?Jessica Milford,NP ?Marlyce Huge, PA ?Audry Riles, PharmD ? ? ?Please be sure to bring in all your medications bottles to every appointment.  ? ?

## 2021-12-17 ENCOUNTER — Inpatient Hospital Stay (HOSPITAL_COMMUNITY): Payer: Medicare PPO

## 2021-12-17 ENCOUNTER — Encounter (HOSPITAL_COMMUNITY): Payer: Self-pay | Admitting: Internal Medicine

## 2021-12-17 ENCOUNTER — Inpatient Hospital Stay (HOSPITAL_COMMUNITY)
Admission: EM | Disposition: A | Discharge status: Home / Self Care | Payer: Self-pay | Source: Home / Self Care | Attending: Internal Medicine

## 2021-12-17 ENCOUNTER — Encounter: Payer: Self-pay | Admitting: Pulmonary Disease

## 2021-12-17 DIAGNOSIS — I493 Ventricular premature depolarization: Secondary | ICD-10-CM | POA: Diagnosis not present

## 2021-12-17 DIAGNOSIS — I442 Atrioventricular block, complete: Secondary | ICD-10-CM | POA: Diagnosis not present

## 2021-12-17 DIAGNOSIS — I480 Paroxysmal atrial fibrillation: Secondary | ICD-10-CM

## 2021-12-17 HISTORY — PX: PACEMAKER IMPLANT: EP1218

## 2021-12-17 LAB — URINALYSIS, ROUTINE W REFLEX MICROSCOPIC
Bilirubin Urine: NEGATIVE
Glucose, UA: NEGATIVE mg/dL
Hgb urine dipstick: NEGATIVE
Ketones, ur: NEGATIVE mg/dL
Nitrite: NEGATIVE
Protein, ur: NEGATIVE mg/dL
Specific Gravity, Urine: 1.015 (ref 1.005–1.030)
pH: 6 (ref 5.0–8.0)

## 2021-12-17 LAB — ECHOCARDIOGRAM COMPLETE
S' Lateral: 2.5 cm
Weight: 2537.6 oz

## 2021-12-17 LAB — MRSA NEXT GEN BY PCR, NASAL: MRSA by PCR Next Gen: NOT DETECTED

## 2021-12-17 LAB — GLUCOSE, CAPILLARY: Glucose-Capillary: 107 mg/dL — ABNORMAL HIGH (ref 70–99)

## 2021-12-17 SURGERY — PACEMAKER IMPLANT

## 2021-12-17 MED ORDER — LIDOCAINE HCL 1 % IJ SOLN
INTRAMUSCULAR | Status: AC
  Filled 2021-12-17: qty 20

## 2021-12-17 MED ORDER — HEPARIN (PORCINE) 25000 UT/250ML-% IV SOLN: 1200.0000 [IU]/h | INTRAVENOUS | Status: DC

## 2021-12-17 MED ORDER — SODIUM CHLORIDE 0.9 % IV SOLN
80.0000 mg | INTRAVENOUS | Status: AC
  Administered 2021-12-17: 80 mg
  Filled 2021-12-17: qty 2

## 2021-12-17 MED ORDER — CHLORHEXIDINE GLUCONATE CLOTH 2 % EX PADS
6.0000 | MEDICATED_PAD | Freq: Every day | CUTANEOUS | Status: DC
  Administered 2021-12-17 – 2021-12-18 (×2): 6 via TOPICAL

## 2021-12-17 MED ORDER — LIDOCAINE HCL (PF) 1 % IJ SOLN
INTRAMUSCULAR | Status: DC | PRN
Start: 2021-12-17 — End: 2021-12-17
  Administered 2021-12-17: 60 mL

## 2021-12-17 MED ORDER — SODIUM CHLORIDE 0.9 % IV SOLN: 250.0000 mL | INTRAVENOUS | Status: DC

## 2021-12-17 MED ORDER — SODIUM CHLORIDE 0.9% FLUSH
3.0000 mL | Freq: Two times a day (BID) | INTRAVENOUS | Status: DC
Start: 2021-12-17 — End: 2021-12-18
  Administered 2021-12-17 – 2021-12-18 (×2): 3 mL via INTRAVENOUS

## 2021-12-17 MED ORDER — HEPARIN (PORCINE) IN NACL 1000-0.9 UT/500ML-% IV SOLN
INTRAVENOUS | Status: AC
  Filled 2021-12-17: qty 500

## 2021-12-17 MED ORDER — CHLORHEXIDINE GLUCONATE 4 % EX LIQD
60.0000 mL | Freq: Once | CUTANEOUS | Status: DC
Start: 2021-12-17 — End: 2021-12-17

## 2021-12-17 MED ORDER — SODIUM CHLORIDE 0.9 % IV SOLN
INTRAVENOUS | Status: AC
  Filled 2021-12-17: qty 2

## 2021-12-17 MED ORDER — CHLORHEXIDINE GLUCONATE 4 % EX LIQD: 60.0000 mL | Freq: Once | CUTANEOUS | Status: DC

## 2021-12-17 MED ORDER — CEFAZOLIN SODIUM-DEXTROSE 2-4 GM/100ML-% IV SOLN
2.0000 g | INTRAVENOUS | Status: AC
  Administered 2021-12-17: 2 g via INTRAVENOUS

## 2021-12-17 MED ORDER — HEPARIN (PORCINE) IN NACL 1000-0.9 UT/500ML-% IV SOLN
INTRAVENOUS | Status: DC | PRN
  Administered 2021-12-17: 500 mL

## 2021-12-17 MED ORDER — CEFAZOLIN SODIUM-DEXTROSE 2-4 GM/100ML-% IV SOLN
INTRAVENOUS | Status: AC
  Filled 2021-12-17: qty 100

## 2021-12-17 MED ORDER — SODIUM CHLORIDE 0.9% FLUSH: 3.0000 mL | INTRAVENOUS | Status: DC | PRN

## 2021-12-17 MED ORDER — SODIUM CHLORIDE 0.9 % IV SOLN: INTRAVENOUS | Status: DC

## 2021-12-17 SURGICAL SUPPLY — 14 items
CABLE SURGICAL S-101-97-12 (CABLE) ×2 IMPLANT
CATH CPS LOCATOR 3D MED (CATHETERS) ×1 IMPLANT
CPS IMPLANT KIT 410190 (MISCELLANEOUS) ×1 IMPLANT
HELIX LOCKING TOOL (MISCELLANEOUS) ×2
LEAD TENDRIL MRI 52CM LPA1200M (Lead) ×1 IMPLANT
LEAD TENDRIL SDX 2088TC-58CM (Lead) ×1 IMPLANT
PACEMAKER ASSURITY DR-RF (Pacemaker) ×1 IMPLANT
PAD DEFIB RADIO PHYSIO CONN (PAD) ×2 IMPLANT
SHEATH 8FR PRELUDE SNAP 13 (SHEATH) ×1 IMPLANT
SHEATH 9FR PRELUDE SNAP 13 (SHEATH) ×1 IMPLANT
SHEATH PROBE COVER 6X72 (BAG) ×1 IMPLANT
SLITTER AGILIS HISPRO (INSTRUMENTS) ×1 IMPLANT
TOOL HELIX LOCKING (MISCELLANEOUS) IMPLANT
TRAY PACEMAKER INSERTION (PACKS) ×2 IMPLANT

## 2021-12-17 NOTE — Progress Notes (Signed)
?  Transition of Care (TOC) Screening Note ? ? ?Patient Details  ?Name: Sabrina Douglas ?Date of Birth: 1941/07/27 ? ? ?Transition of Care (TOC) CM/SW Contact:    ?Arhianna Ebey, LCSW ?Phone Number: ?12/17/2021, 10:19 AM ? ? ? ?Transition of Care Department [facility]) has reviewed patient and no TOC needs have been identified at this time. We will continue to monitor patient advancement through interdisciplinary progression rounds. Patient will benefit from PT/OT consult for disposition recommendations. If new patient transition needs arise, please place a TOC consult. ?  ?

## 2021-12-17 NOTE — Consult Note (Addendum)
?  ?Advanced Heart Failure Team Consult Note ? ? ?Primary Physician: Isaac Bliss, Rayford Halsted, MD ?PCP-Cardiologist:  None ? ?Reason for Consultation: Heart Failure  ? ?HPI:   ? ?Sabrina Douglas is seen today for evaluation of heart failure  at the request of Dr Alfred Levins.  ? ?Sabrina Douglas is a 81 year old with a history of ILD,  PAF, scleroderma.  ? ?Patient seen for initial consult with Dr. [name] on 08/28/18. CXR showed mildly worsened bibasilar pneumonia, suggestive of atypical infection d/t waxing/weaning symptoms. 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  ANA positive, CCP negative. 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  ?  ?Admitted 2/20 with severe weakness. Fount to have hyponatremia at 114, dysphagia and about 20 pounds weight loss in the months. Treated with 3% saline and eventually demeclocycline.  ?  ?Pt admitted 3/20 with new onset Afib. Converted back to NSR with amiodarone drip and was discontinued. Toprol resumed. Eliquis started. CT chest in 3/20 no coronary calcification. Alexis 3/20 no PAH.  ?  ?Follows with Dr. [name] & [name]. 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 Was on plaquenil/prednisone. Prednisone stopped in 10/21. Switched to Cellcept. Hires CT improving.  ?  ?C/o chest tightness. Coronary CT ordered but never completed because she did not want to take b-blocker..  ?  ?12/2020 Dr [name] started on mycophenalate.    ?  ?4/23 Hires CT - no change  ?  ?Saw Dr Haroldine Laws yesterday.  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 [name] AT placed. Monitoring company called to report several episode of CHB. She was instructed to come to the hospital. Reports intermittent dizziness. Ongoing fatigue.   ? ?Admitted to ICU cardiology. EKG: CHB & 2:1 AV block. CXR no acute findings. Creatine 1, K 4, Mag 2.1  BNP 162. BP stable. Last dose of eliquis 5/11//23 morning dose.   ?  ?Studies:  ?Zio 10/12/20 ?1. Sinus rhythm -  avg HR of 78 bpm.  ?2. Bundle  Branch Block/IVCD was present.  ?3. 16 runs of SVT occurred, the run with the fastest interval lasting 7 beats with a max rate of 190 bpm, the longest lasting 8 beats with an avg rate of 114 bpm. ?4. 708 episode(s) of brief nocturnal AV Block (2nd? Mobitz II) occurred,  ?5. Rare PACs and PVCs ?6. Nearly all patient-triggered events associated with sinus rhythm  ?  ?HiRes CT 10/20 ?1. Bilateral lower lobe predominant bronchiectasis and volume loss ?may be postinfectious or post inflammatory in etiology. Findings are ?suggestive of an alternative diagnosis (not UIP) per consensus ?guidelines: Diagnosis of Idiopathic Pulmonary Fibrosis ?  ?  ?Zio Patch 11/28/18  ?1. Predominant rhythm was sinus. Patient had a min HR of 54 bpm, max HR of 214 bpm, and avg HR of 96 bpm. ?2. Two brief runs of NSVT - longest was 8 beats ?3. Forty five brief runs of SVT. The run with the fastest interval lasting 5 beats with a max rate of 214 bpm, the longest lasting 15.7 secs with an avg rate of 154 bpm. 4. Rare PVCs ?  ?  ?RHC 10/15/18 ?RA = 3 ?RV = 39/4 ?PA = 39/7 (18) ?PCW = 4 ?Fick cardiac output/index = 6.5/3.8 ?PVR = 2.1 WU ?Ao sat = 100% ?PA sat = 81%, 82% ?High SVC and axillary sats both 81% ?  ?Assessment: ? 1. Normal pulmonary pressures ?2. High output physiology without evidence of intracardiac shunting  ?  ?  Labs  ?ANA 1:1280 (pos) ?ESR 104  ?RF 55 (pos) ?RNP > 8.0  (pos) ?AntiJo negative ?CCP < 16 ?Anti-Smith 2.0 (pos) ?Scl-70 < 1.0 ?ANCA negative ?  ?HRCT 09/10/18 ?1. 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 Repeat high-resolution chest CT is ?recommended in 12 months to assess for temporal changes in the ?appearance of the lung parenchyma. ?2. Aortic atherosclerosis. ?  ?Echo 09/30/18 ? 1. The left ventricle has hyperdynamic systolic function, with an ejection fraction of >65%. The cavity size was normal. Left ventricular  diastolic parameters were normal No evidence of left ventricular regional wall motion abnormalities. ? 2. No evidence of left ventricular regional wall motion abnormalities. ? 3. The right ventricle has normal systolic function. The cavity was grossly normal. RA-RV peak gradient increased at 66.9 mmHg consistent with probable severe pulmonary hypertension. Could not estimate CVP however. ? 4. The aortic valve is tricuspid Mild aortic annular calcification noted. ? 5. The mitral valve is normal in structure. ? 6. The tricuspid valve is normal in structure. Tricuspid valve regurgitation is  ?  ?PFTs  (10/02/18) ?FEV1 1.13 (66%) ?FVC   1.23 (56%) ?DLCO 16% ?  ?VQ scan 10/03/18: Negative ?  ? ?Review of Systems: [y] = yes, _0  = no  ? ?General: Weight gain _1 ; Weight loss _2 ; Anorexia _3 ; Fatigue [Y ]; Fever _4 ; Chills _5 ; Weakness [Y ]  ?Cardiac: Chest pain/pressure _6 ; Resting SOB _7 ; Exertional SOB [ Y]; Orthopnea _8 ; Pedal Edema _9 ; Palpitations _10 ; Syncope _11 ; Presyncope [ Y]; Paroxysmal nocturnal dyspnea_12   ?Pulmonary: Cough _13 ; Wheezing_14 ; Hemoptysis_15 ; Sputum _16 ; Snoring _17   ?GI: Vomiting_18 ; Dysphagia_19 ; Melena_20 ; Hematochezia _21 ; Heartburn_22 ; Abdominal pain _23 ; Constipation _24 ; Diarrhea _25 ; BRBPR _26   ?GU: Hematuria_27 ; Dysuria _28 ; Nocturia_29   ?Vascular: Pain in legs with walking _30 ; Pain in feet with lying flat _31 ; Non-healing sores _32 ; Stroke _33 ; TIA _34 ; Slurred speech _35 ;  ?Neuro: Headaches_36 ; Vertigo_37 ; Seizures_38 ; Paresthesias_39 ;Blurred vision _40 ; Diplopia _41 ; Vision changes _42   ?Ortho/Skin: Arthritis _43 ; Joint pain [ Y]; Muscle pain _44 ; Joint swelling _45 ; Back Pain [Y ]; Rash _46   ?Psych: Depression_47 ; Anxiety_48   ?Heme: Bleeding problems _49 ; Clotting disorders _50 ; Anemia _51   ?Endocrine: Diabetes _52 ; Thyroid dysfunction_53  ? ?Home Medications ?Prior to Admission medications   ?Medication Sig Start Date End Date Taking? Authorizing Provider  ?acetaminophen  (TYLENOL) 325 MG tablet Take 325 mg by mouth every 6 (six) hours as needed for moderate pain or headache.   Yes [provider]  ?ELIQUIS 5 MG TABS tablet TAKE 1 TABLET(5 MG) BY MOUTH TWICE DAILY ?Patient taking differently: Take 5 mg by mouth 2 (two) times daily. 11/26/21  Yes Huntleigh Doolen, Shaune Pascal, MD  ?fexofenadine (ALLEGRA) 180 MG tablet Take 180 mg by mouth daily as needed for allergies or rhinitis.   Yes [provider]  ?furosemide (LASIX) 40 MG tablet Take 1 tablet (40 mg total) by mouth daily. ?Patient taking differently: Take 40 mg by mouth daily. 1:30 pm or 2 pm 10/05/21  Yes Isaac Bliss, Rayford Halsted, MD  ?Multiple Vitamin (MULTIVITAMIN WITH MINERALS) TABS tablet Take 1 tablet by mouth daily.  Yes [provider]  ?mycophenolate (CELLCEPT) 500 MG tablet Take 500 mg by mouth 2 (two) times daily.   Yes [provider]  ?OXYGEN Inhale 1.5 L into the lungs See admin instructions. At bedtime  ?During the day as needed for shortness of breath   Yes [provider]  ?pantoprazole (PROTONIX) 40 MG tablet Take 1 tablet (40 mg total) by mouth daily. 10/05/21  Yes Isaac Bliss, Rayford Halsted, MD  ?RESTASIS 0.05 % ophthalmic emulsion Place 1 drop into both eyes 2 (two) times daily. 8:30 am and 6:30 pm 09/24/21  Yes [provider]  ?SYSTANE ULTRA 0.4-0.3 % SOLN Apply 1 drop to eye 2 (two) times daily. 12:30 pm and 8 pm 09/24/21  Yes [provider]  ?VITAMIN D PO Take 1,000 mg by mouth daily.   Yes [provider]  ?Glycerin-Hypromellose-PEG 400 0.2-0.2-1 % SOLN Place 2 drops into both eyes daily. ?Patient not taking: Reported on 12/16/2021    [provider]  ?metoprolol succinate (TOPROL-XL) 12.5 mg TB24 24 hr tablet 1/2 tablet by mouth daily as needed ?Patient not taking: Reported on 12/16/2021    [provider]  ? ? ?Past Medical History: ?Past Medical History:  ?Diagnosis Date  ? Anxiety   ? CHF (congestive heart failure) ([place])    ? Depression   ? Hyperlipidemia   ? ? ?Past Surgical History: ?Past Surgical History:  ?Procedure Laterality Date  ? BALLOON DILATION N/A 10/04/2018  ? Procedure: BALLOON DILATION;  Surgeon: Alessandra Bevels,

## 2021-12-17 NOTE — Progress Notes (Addendum)
ADDENDUM: Pt to get PPM today, hold heparin drip for now per EP. ? ? ? ?ANTICOAGULATION CONSULT NOTE ? ?Pharmacy Consult for heparin ?Indication: atrial fibrillation ? ?No Known Allergies ? ?Patient Measurements: ?Weight: 71.9 kg (158 lb 9.6 oz) ?Heparin Dosing Weight: 72kg ? ?Vital Signs: ?Temp: 98.3 ?F (36.8 ?C) (05/12 9924) ?Temp Source: Oral (05/12 2683) ?BP: 123/53 (05/12 0800) ?Pulse Rate: 48 (05/12 0800) ? ?Labs: ?Recent Labs  ?  12/16/21 ?2032 12/16/21 ?2237  ?HGB 13.9  --   ?HCT 44.0  --   ?PLT 279  --   ?CREATININE 1.00  --   ?TROPONINIHS 18* 36*  ? ? ?Estimated Creatinine Clearance: 42.2 mL/min (by C-G formula based on SCr of 1 mg/dL). ? ? ?Medical History: ?Past Medical History:  ?Diagnosis Date  ? Anxiety   ? CHF (congestive heart failure) ([facility])   ? Depression   ? Hyperlipidemia   ? ? ? ?Assessment: ?39 yoF admitted with CHF and CHB. Pt on apixaban PTA for hx AF, to hold for now and start heparin in case PPM needed. Last apixaban dose was 5/11. ? ? ? ?Goal of Therapy:  ?Heparin level 0.3-0.7 units/ml ?aPTT 66-102 seconds ?Monitor platelets by anticoagulation protocol: Yes ?  ?Plan:  ?Heparin 1200 units/h no bolus ?Check aPTT in 8h ?Daily heparin level, aPTT, CBC ? ?Fredonia Highland, PharmD, BCPS, BCCP ?Clinical Pharmacist ?[phone] ?Please check AMION for all [facility] Pharmacy numbers ?12/17/2021 ? ? ? ?

## 2021-12-17 NOTE — Progress Notes (Signed)
S/p PPM implant today ?Will resume Eliquis post pacing, in d/w Df. Camnitz perhaps tomorrow pending pocket stability tomorrow. ? ?Francis Dowse, PA-C ?

## 2021-12-17 NOTE — Discharge Instructions (Addendum)
Hold off on restarting Eliquis for 5 days, you can restart Eliquis on 12/23/2021 ? ? ? ? ? ? ? ? ? ?Supplemental Discharge Instructions for  ?Pacemaker/Defibrillator Patients ? ? ?Activity ?No heavy lifting or vigorous activity with your left/right arm for 6 to 8 weeks.  Do not raise your left/right arm above your head for one week.  Gradually raise your affected arm as drawn below. ? ?        ?   12/22/21                     12/23/21                     12/24/21                  12/25/21 ?__ ? ?NO DRIVING until cleared to at your wound check visit. ? ?WOUND CARE ?Keep the wound area clean and dry.  Do not get this area wet , no showers for one week; you may shower on   12/25/2021  . ?The tape/steri-strips on your wound will fall off; do not pull them off.  No bandage is needed on the site.  DO  NOT apply any creams, oils, or ointments to the wound area. ?If you notice any drainage or discharge from the wound, any swelling or bruising at the site, or you develop a fever > 101? F after you are discharged home, call the office at once. ? ?Special Instructions ?You are still able to use cellular telephones; use the ear opposite the side where you have your pacemaker/defibrillator.  Avoid carrying your cellular phone near your device. ?When traveling through airports, show security personnel your identification card to avoid being screened in the metal detectors.  Ask the security personnel to use the hand wand. ?Avoid arc welding equipment, MRI testing (magnetic resonance imaging), TENS units (transcutaneous nerve stimulators).  Call the office for questions about other devices. ?Avoid electrical appliances that are in poor condition or are not properly grounded. ?Microwave ovens are safe to be near or to operate. ? ?

## 2021-12-17 NOTE — Progress Notes (Signed)
Echocardiogram ?2D Echocardiogram has been performed. ? ?Sabrina Douglas Orvie Caradine RDCS ?12/17/2021, 2:56 PM ?

## 2021-12-17 NOTE — Interval H&P Note (Signed)
History and Physical Interval Note: ? ?12/17/2021 ?3:04 PM ? ?Sabrina Douglas  has presented today for surgery, with the diagnosis of chest pain.  The various methods of treatment have been discussed with the patient and family. After consideration of risks, benefits and other options for treatment, the patient has consented to  Procedure(s): ?PACEMAKER IMPLANT (N/A) as a surgical intervention.  The patient's history has been reviewed, patient examined, no change in status, stable for surgery.  I have reviewed the patient's chart and labs.  Questions were answered to the patient's satisfaction.   ? ? ?Harlee Eckroth Daphine Deutscher [place] ? ? ?

## 2021-12-17 NOTE — Consult Note (Addendum)
?Cardiology Consultation:  ? ?Patient ID: Sabrina Douglas ?MRN: [id number]; DOB: 14-Mar-1941 ? ?Admit date: 12/16/2021 ?Date of Consult: 12/17/2021 ? ?PCP:  Isaac Bliss, Rayford Halsted, MD ?  ?[facility] Providers ?Cardiologist:  Dr. Haroldine Laws ? ?Patient Profile:  ? ?Peggye Douglas is a 81 y.o. 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 Marijean Bravo, Dr. [name] pulm), RBBB, AFib, PSVT  who is being seen 12/17/2021 for the evaluation of CHB at the request of Dr. Haroldine Laws. ? ?AFib  ?Diagnosed 2020 ?Amiodarone briefly only gtt to restore SR ? ?History of Present Illness:  ? ?Ms. Hellums saw Dr. Haroldine Laws yesterday with c/o progressive fatigue, weakness, and some dizzy spells in the last month's time.  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 ?She had worn a monitor march 2022 with known nocturnal heart block, planned to repeat monitoring for rhythm, PVC burden ? ?She came to the ER last evening alerted by the monitor to have developed CHB and advised to go to the ER ? ?LABS ?K+ 4.0 ?Mag 2.1 ?BUN/Creat 13/1.00 ?BNP 162 ?HS Trop 18/36 ?WBC 4.1 ?H/H 13/44 ?Plts 279 ?TSH 4.364 ? ?Home meds reviewed include ?Toprol 12.5mg  PRN though has not been taking it ?No other potential nodal blocking agents ? ?She reports at her baseline she gets along OK, able to do her ADLS without much trouble, makes sure she gets up and walks moves around every few hours at home, some baseline DOE ?Intermittently bothered by her GERD with some episgastric discomfort and belching. ?In the last 1-33mo, has noted just not her usual energy, fatigued easier. ?No CP ?In the last month especially with some intermittent dizzy spells, that she sits down until it settles and more progressive weakness and fatigue. ?No syncope ? ? ?Past Medical History:  ?Diagnosis Date  ? Anxiety   ? CHF (congestive heart failure) ([place])   ? Depression   ?  Hyperlipidemia   ? ? ?Past Surgical History:  ?Procedure Laterality Date  ? BALLOON DILATION N/A 10/04/2018  ? Procedure: BALLOON DILATION;  Surgeon: Otis Brace, MD;  Location: [facility];  Service: Gastroenterology;  Laterality: N/A;  ? BIOPSY  10/04/2018  ? Procedure: BIOPSY;  Surgeon: Otis Brace, MD;  Location: [facility];  Service: Gastroenterology;;  ? CATARACT EXTRACTION    ? ESOPHAGOGASTRODUODENOSCOPY (EGD) WITH PROPOFOL N/A 10/04/2018  ? Procedure: ESOPHAGOGASTRODUODENOSCOPY (EGD) WITH PROPOFOL;  Surgeon: Otis Brace, MD;  Location: [facility];  Service: Gastroenterology;  Laterality: N/A;  ? RIGHT HEART CATH N/A 10/15/2018  ? Procedure: RIGHT HEART CATH;  Surgeon: Jolaine Artist, MD;  Location: [place] [facility];  Service: Cardiovascular;  Laterality: N/A;  ?  ? ?Home Medications:  ?Prior to Admission medications   ?Medication Sig Start Date End Date Taking? Authorizing Provider  ?acetaminophen (TYLENOL) 325 MG tablet Take 325 mg by mouth every 6 (six) hours as needed for moderate pain or headache.   Yes [provider]  ?ELIQUIS 5 MG TABS tablet TAKE 1 TABLET(5 MG) BY MOUTH TWICE DAILY ?Patient taking differently: Take 5 mg by mouth 2 (two) times daily. 11/26/21  Yes Bensimhon, Shaune Pascal, MD  ?fexofenadine (ALLEGRA) 180 MG tablet Take 180 mg by mouth daily as needed for allergies or rhinitis.   Yes [provider]  ?furosemide (LASIX) 40 MG tablet Take 1 tablet (40 mg total) by mouth daily. ?Patient taking differently: Take 40 mg by mouth daily. 1:30 pm or  2 pm 10/05/21  Yes Isaac Bliss, Rayford Halsted, MD  ?Multiple Vitamin (MULTIVITAMIN WITH MINERALS) TABS tablet Take 1 tablet by mouth daily.   Yes [provider]  ?mycophenolate (CELLCEPT) 500 MG tablet Take 500 mg by mouth 2 (two) times daily.   Yes [provider]  ?OXYGEN Inhale 1.5 L into the lungs See admin instructions. At bedtime  ?During the day as needed for shortness of breath    Yes [provider]  ?pantoprazole (PROTONIX) 40 MG tablet Take 1 tablet (40 mg total) by mouth daily. 10/05/21  Yes Isaac Bliss, Rayford Halsted, MD  ?RESTASIS 0.05 % ophthalmic emulsion Place 1 drop into both eyes 2 (two) times daily. 8:30 am and 6:30 pm 09/24/21  Yes [provider]  ?SYSTANE ULTRA 0.4-0.3 % SOLN Apply 1 drop to eye 2 (two) times daily. 12:30 pm and 8 pm 09/24/21  Yes [provider]  ?VITAMIN D PO Take 1,000 mg by mouth daily.   Yes [provider]  ?Glycerin-Hypromellose-PEG 400 0.2-0.2-1 % SOLN Place 2 drops into both eyes daily. ?Patient not taking: Reported on 12/16/2021    [provider]  ?metoprolol succinate (TOPROL-XL) 12.5 mg TB24 24 hr tablet 1/2 tablet by mouth daily as needed ?Patient not taking: Reported on 12/16/2021    [provider]  ? ? ?Inpatient Medications: ?Scheduled Meds: ? furosemide  40 mg Oral Daily  ? multivitamin with minerals  1 tablet Oral Daily  ? mycophenolate  500 mg Oral BID  ? pantoprazole  40 mg Oral Daily  ? senna  1 tablet Oral BID  ? sodium chloride flush  3 mL Intravenous Q12H  ? ?Continuous Infusions: ? ?PRN Meds: ?acetaminophen **OR** acetaminophen, atropine, hydrALAZINE, melatonin, ondansetron **OR** ondansetron (ZOFRAN) IV, polyvinyl alcohol ? ?Allergies:   No Known Allergies ? ?Social History:   ?Social History  ? ?Socioeconomic History  ? Marital status: Widowed  ?  Spouse name: Not on file  ? Number of children: Not on file  ? Years of education: Not on file  ? Highest education level: 12th grade  ?Occupational History  ? Not on file  ?Tobacco Use  ? Smoking status: Never  ?  Passive exposure: Yes  ? Smokeless tobacco: Never  ?Vaping Use  ? Vaping Use: Never used  ?Substance and Sexual Activity  ? Alcohol use: Never  ? Drug use: Not on file  ? Sexual activity: Not on file  ?Other Topics Concern  ? Not on file  ?Social History Narrative  ? Not on file  ? ?Social Determinants of Health  ? ?Financial  Resource Strain: Low Risk   ? Difficulty of Paying Living Expenses: Not hard at all  ?Food Insecurity: No Food Insecurity  ? Worried About Charity fundraiser in the Last Year: Never true  ? Ran Out of Food in the Last Year: Never true  ?Transportation Needs: No Transportation Needs  ? Lack of Transportation (Medical): No  ? Lack of Transportation (Non-Medical): No  ?Physical Activity: Unknown  ? Days of Exercise per Week: 0 days  ? Minutes of Exercise per Session: Not on file  ?Stress: Stress Concern Present  ? Feeling of Stress : To some extent  ?Social Connections: Moderately Isolated  ? Frequency of Communication with Friends and Family: More than three times a week  ? Frequency of Social Gatherings with Friends and Family: Once a week  ? Attends Religious Services: Never  ? Active Member of Clubs or Organizations: Yes  ?  Attends Archivist Meetings: Patient refused  ? Marital Status: Widowed  ?Intimate Partner Violence: Not on file  ?  ?Family History:   ?Family History  ?Problem Relation Age of Onset  ? Sudden death Mother 35  ?     sudden death post hysterectomy  ? Prostate cancer Father   ? Diabetes Father   ?  ? ?ROS:  ?Please see the history of present illness.  ?All other ROS reviewed and negative.    ? ?Physical Exam/Data:  ? ?Vitals:  ? 12/17/21 0400 12/17/21 0644 12/17/21 0800 12/17/21 0836  ?BP: (!) 134/50 (!) 104/58 (!) 123/53   ?Pulse: (!) 46 (!) 47 (!) 48   ?Resp: 16 16 17    ?Temp:    98.3 ?F (36.8 ?C)  ?TempSrc:    Oral  ?SpO2: 100% 100% 100%   ?Weight:      ? ? ?Intake/Output Summary (Last 24 hours) at 12/17/2021 0854 ?Last data filed at 12/17/2021 0700 ?Gross per 24 hour  ?Intake 240 ml  ?Output 350 ml  ?Net -110 ml  ? ? ?  12/16/2021  ?  8:17 PM 12/16/2021  ? 11:14 AM 10/05/2021  ?  8:57 AM  ?Last 3 Weights  ?Weight (lbs) 158 lb 9.6 oz 158 lb 6.4 oz 156 lb 8 oz  ?Weight (kg) 71.94 kg 71.85 kg 70.988 kg  ?   ?Body mass index is 27.22 kg/m?.  ?General:  Well nourished, well developed, in no  acute distress ?HEENT: normal ?Neck: no JVD ?Vascular: No carotid bruits; Distal pulses 2+ bilaterally ?Cardiac:  RRR; no murmurs, gallops or rubs ?Lungs:  CTA b/l, no wheezing, rhonchi or rales  ?Abd: soft, nontend

## 2021-12-17 NOTE — Plan of Care (Signed)
?  Problem: Education: ?Goal: Knowledge of General Education information will improve ?Description: Including pain rating scale, medication(s)/side effects and non-pharmacologic comfort measures ?Outcome: Completed/Met ?  ?Problem: Health Behavior/Discharge Planning: ?Goal: Ability to manage health-related needs will improve ?Outcome: Progressing ?  ?Problem: Clinical Measurements: ?Goal: Respiratory complications will improve ?Outcome: Progressing ?  ?

## 2021-12-17 NOTE — H&P (View-Only) (Signed)
?Cardiology Consultation:  ? ?Patient ID: Sabrina Douglas ?MRN: [id number]; DOB: 10/13/1940 ? ?Admit date: 12/16/2021 ?Date of Consult: 12/17/2021 ? ?PCP:  Isaac Bliss, Rayford Halsted, MD ?  ?[facility] Providers ?Cardiologist:  Dr. Haroldine Laws ? ?Patient Profile:  ? ?Sabrina Douglas is a 81 y.o. 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 Marijean Bravo, Dr. [name] pulm), RBBB, AFib, PSVT  who is being seen 12/17/2021 for the evaluation of CHB at the request of Dr. Haroldine Laws. ? ?AFib  ?Diagnosed 2020 ?Amiodarone briefly only gtt to restore SR ? ?History of Present Illness:  ? ?Sabrina Douglas saw Dr. Haroldine Laws yesterday with c/o progressive fatigue, weakness, and some dizzy spells in the last month's time.  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 ?She had worn a monitor march 2022 with known nocturnal heart block, planned to repeat monitoring for rhythm, PVC burden ? ?She came to the ER last evening alerted by the monitor to have developed CHB and advised to go to the ER ? ?LABS ?K+ 4.0 ?Mag 2.1 ?BUN/Creat 13/1.00 ?BNP 162 ?HS Trop 18/36 ?WBC 4.1 ?H/H 13/44 ?Plts 279 ?TSH 4.364 ? ?Home meds reviewed include ?Toprol 12.5mg  PRN though has not been taking it ?No other potential nodal blocking agents ? ?She reports at her baseline she gets along OK, able to do her ADLS without much trouble, makes sure she gets up and walks moves around every few hours at home, some baseline DOE ?Intermittently bothered by her GERD with some episgastric discomfort and belching. ?In the last 1-25mo, has noted just not her usual energy, fatigued easier. ?No CP ?In the last month especially with some intermittent dizzy spells, that she sits down until it settles and more progressive weakness and fatigue. ?No syncope ? ? ?Past Medical History:  ?Diagnosis Date  ? Anxiety   ? CHF (congestive heart failure) ([place])   ? Depression   ?  Hyperlipidemia   ? ? ?Past Surgical History:  ?Procedure Laterality Date  ? BALLOON DILATION N/A 10/04/2018  ? Procedure: BALLOON DILATION;  Surgeon: Otis Brace, MD;  Location: [facility];  Service: Gastroenterology;  Laterality: N/A;  ? BIOPSY  10/04/2018  ? Procedure: BIOPSY;  Surgeon: Otis Brace, MD;  Location: [facility];  Service: Gastroenterology;;  ? CATARACT EXTRACTION    ? ESOPHAGOGASTRODUODENOSCOPY (EGD) WITH PROPOFOL N/A 10/04/2018  ? Procedure: ESOPHAGOGASTRODUODENOSCOPY (EGD) WITH PROPOFOL;  Surgeon: Otis Brace, MD;  Location: [facility];  Service: Gastroenterology;  Laterality: N/A;  ? RIGHT HEART CATH N/A 10/15/2018  ? Procedure: RIGHT HEART CATH;  Surgeon: Jolaine Artist, MD;  Location: [place] [facility];  Service: Cardiovascular;  Laterality: N/A;  ?  ? ?Home Medications:  ?Prior to Admission medications   ?Medication Sig Start Date End Date Taking? Authorizing Provider  ?acetaminophen (TYLENOL) 325 MG tablet Take 325 mg by mouth every 6 (six) hours as needed for moderate pain or headache.   Yes [provider]  ?ELIQUIS 5 MG TABS tablet TAKE 1 TABLET(5 MG) BY MOUTH TWICE DAILY ?Patient taking differently: Take 5 mg by mouth 2 (two) times daily. 11/26/21  Yes Bensimhon, Shaune Pascal, MD  ?fexofenadine (ALLEGRA) 180 MG tablet Take 180 mg by mouth daily as needed for allergies or rhinitis.   Yes [provider]  ?furosemide (LASIX) 40 MG tablet Take 1 tablet (40 mg total) by mouth daily. ?Patient taking differently: Take 40 mg by mouth daily. 1:30 pm or  2 pm 10/05/21  Yes Isaac Bliss, Rayford Halsted, MD  ?Multiple Vitamin (MULTIVITAMIN WITH MINERALS) TABS tablet Take 1 tablet by mouth daily.   Yes [provider]  ?mycophenolate (CELLCEPT) 500 MG tablet Take 500 mg by mouth 2 (two) times daily.   Yes [provider]  ?OXYGEN Inhale 1.5 L into the lungs See admin instructions. At bedtime  ?During the day as needed for shortness of breath    Yes [provider]  ?pantoprazole (PROTONIX) 40 MG tablet Take 1 tablet (40 mg total) by mouth daily. 10/05/21  Yes Isaac Bliss, Rayford Halsted, MD  ?RESTASIS 0.05 % ophthalmic emulsion Place 1 drop into both eyes 2 (two) times daily. 8:30 am and 6:30 pm 09/24/21  Yes [provider]  ?SYSTANE ULTRA 0.4-0.3 % SOLN Apply 1 drop to eye 2 (two) times daily. 12:30 pm and 8 pm 09/24/21  Yes [provider]  ?VITAMIN D PO Take 1,000 mg by mouth daily.   Yes [provider]  ?Glycerin-Hypromellose-PEG 400 0.2-0.2-1 % SOLN Place 2 drops into both eyes daily. ?Patient not taking: Reported on 12/16/2021    [provider]  ?metoprolol succinate (TOPROL-XL) 12.5 mg TB24 24 hr tablet 1/2 tablet by mouth daily as needed ?Patient not taking: Reported on 12/16/2021    [provider]  ? ? ?Inpatient Medications: ?Scheduled Meds: ? furosemide  40 mg Oral Daily  ? multivitamin with minerals  1 tablet Oral Daily  ? mycophenolate  500 mg Oral BID  ? pantoprazole  40 mg Oral Daily  ? senna  1 tablet Oral BID  ? sodium chloride flush  3 mL Intravenous Q12H  ? ?Continuous Infusions: ? ?PRN Meds: ?acetaminophen **OR** acetaminophen, atropine, hydrALAZINE, melatonin, ondansetron **OR** ondansetron (ZOFRAN) IV, polyvinyl alcohol ? ?Allergies:   No Known Allergies ? ?Social History:   ?Social History  ? ?Socioeconomic History  ? Marital status: Widowed  ?  Spouse name: Not on file  ? Number of children: Not on file  ? Years of education: Not on file  ? Highest education level: 12th grade  ?Occupational History  ? Not on file  ?Tobacco Use  ? Smoking status: Never  ?  Passive exposure: Yes  ? Smokeless tobacco: Never  ?Vaping Use  ? Vaping Use: Never used  ?Substance and Sexual Activity  ? Alcohol use: Never  ? Drug use: Not on file  ? Sexual activity: Not on file  ?Other Topics Concern  ? Not on file  ?Social History Narrative  ? Not on file  ? ?Social Determinants of Health  ? ?Financial  Resource Strain: Low Risk   ? Difficulty of Paying Living Expenses: Not hard at all  ?Food Insecurity: No Food Insecurity  ? Worried About Charity fundraiser in the Last Year: Never true  ? Ran Out of Food in the Last Year: Never true  ?Transportation Needs: No Transportation Needs  ? Lack of Transportation (Medical): No  ? Lack of Transportation (Non-Medical): No  ?Physical Activity: Unknown  ? Days of Exercise per Week: 0 days  ? Minutes of Exercise per Session: Not on file  ?Stress: Stress Concern Present  ? Feeling of Stress : To some extent  ?Social Connections: Moderately Isolated  ? Frequency of Communication with Friends and Family: More than three times a week  ? Frequency of Social Gatherings with Friends and Family: Once a week  ? Attends Religious Services: Never  ? Active Member of Clubs or Organizations: Yes  ?  Attends Archivist Meetings: Patient refused  ? Marital Status: Widowed  ?Intimate Partner Violence: Not on file  ?  ?Family History:   ?Family History  ?Problem Relation Age of Onset  ? Sudden death Mother 57  ?     sudden death post hysterectomy  ? Prostate cancer Father   ? Diabetes Father   ?  ? ?ROS:  ?Please see the history of present illness.  ?All other ROS reviewed and negative.    ? ?Physical Exam/Data:  ? ?Vitals:  ? 12/17/21 0400 12/17/21 0644 12/17/21 0800 12/17/21 0836  ?BP: (!) 134/50 (!) 104/58 (!) 123/53   ?Pulse: (!) 46 (!) 47 (!) 48   ?Resp: 16 16 17    ?Temp:    98.3 ?F (36.8 ?C)  ?TempSrc:    Oral  ?SpO2: 100% 100% 100%   ?Weight:      ? ? ?Intake/Output Summary (Last 24 hours) at 12/17/2021 0854 ?Last data filed at 12/17/2021 0700 ?Gross per 24 hour  ?Intake 240 ml  ?Output 350 ml  ?Net -110 ml  ? ? ?  12/16/2021  ?  8:17 PM 12/16/2021  ? 11:14 AM 10/05/2021  ?  8:57 AM  ?Last 3 Weights  ?Weight (lbs) 158 lb 9.6 oz 158 lb 6.4 oz 156 lb 8 oz  ?Weight (kg) 71.94 kg 71.85 kg 70.988 kg  ?   ?Body mass index is 27.22 kg/m?.  ?General:  Well nourished, well developed, in no  acute distress ?HEENT: normal ?Neck: no JVD ?Vascular: No carotid bruits; Distal pulses 2+ bilaterally ?Cardiac:  RRR; no murmurs, gallops or rubs ?Lungs:  CTA b/l, no wheezing, rhonchi or rales  ?Abd: soft, nontend

## 2021-12-18 ENCOUNTER — Inpatient Hospital Stay (HOSPITAL_COMMUNITY): Payer: Medicare PPO

## 2021-12-18 DIAGNOSIS — I442 Atrioventricular block, complete: Secondary | ICD-10-CM | POA: Diagnosis not present

## 2021-12-18 DIAGNOSIS — I493 Ventricular premature depolarization: Secondary | ICD-10-CM | POA: Diagnosis not present

## 2021-12-18 DIAGNOSIS — I441 Atrioventricular block, second degree: Secondary | ICD-10-CM | POA: Diagnosis not present

## 2021-12-18 LAB — CBC
HCT: 40.4 % (ref 36.0–46.0)
Hemoglobin: 12.8 g/dL (ref 12.0–15.0)
MCH: 30.4 pg (ref 26.0–34.0)
MCHC: 31.7 g/dL (ref 30.0–36.0)
MCV: 96 fL (ref 80.0–100.0)
Platelets: 228 10*3/uL (ref 150–400)
RBC: 4.21 MIL/uL (ref 3.87–5.11)
RDW: 12.1 % (ref 11.5–15.5)
WBC: 4.7 10*3/uL (ref 4.0–10.5)
nRBC: 0 % (ref 0.0–0.2)

## 2021-12-18 LAB — BASIC METABOLIC PANEL
Anion gap: 6 (ref 5–15)
BUN: 16 mg/dL (ref 8–23)
CO2: 30 mmol/L (ref 22–32)
Calcium: 9.2 mg/dL (ref 8.9–10.3)
Chloride: 103 mmol/L (ref 98–111)
Creatinine, Ser: 1.01 mg/dL — ABNORMAL HIGH (ref 0.44–1.00)
GFR, Estimated: 56 mL/min — ABNORMAL LOW (ref >60)
Glucose, Bld: 117 mg/dL — ABNORMAL HIGH (ref 70–99)
Potassium: 3.9 mmol/L (ref 3.5–5.1)
Sodium: 139 mmol/L (ref 135–145)

## 2021-12-18 LAB — MAGNESIUM: Magnesium: 2.1 mg/dL (ref 1.7–2.4)

## 2021-12-18 MED ORDER — OXYCODONE HCL 5 MG PO TABS
5.0000 mg | ORAL_TABLET | Freq: Once | ORAL | Status: AC
Start: 2021-12-18 — End: 2021-12-18
  Administered 2021-12-18: 5 mg via ORAL
  Filled 2021-12-18: qty 1

## 2021-12-18 MED ORDER — CEFAZOLIN SODIUM-DEXTROSE 1-4 GM/50ML-% IV SOLN
1.0000 g | Freq: Four times a day (QID) | INTRAVENOUS | Status: DC
  Administered 2021-12-18: 1 g via INTRAVENOUS
  Filled 2021-12-18 (×3): qty 50

## 2021-12-18 MED ORDER — VITAMIN D 25 MCG (1000 UNIT) PO TABS: 1000.0000 [IU] | ORAL_TABLET | Freq: Every day | ORAL | Status: AC

## 2021-12-18 NOTE — Evaluation (Signed)
Physical Therapy Evaluation & Discharge ?Patient Details ?Name: Sabrina Douglas ?MRN: [id number] ?DOB: 05-06-1941 ?Today's Date: 12/18/2021 ? ?History of Present Illness ? Pt is an 81 y.o. female who presented 12/16/21 for CHB. 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  ?Clinical Impression ? Pt presents with condition above. PTA, she was IND without DME, living alone in a 1-level house with 1 STE. Her children live nearby and can assist 24/7 if needed. Currently, pt appears to be close to her baseline, only displaying new deficits of L UE strength/AROM secondary to having to maintain her pacemaker precautions. Pt is able to perform all functional mobility safely without UE support, assistance, or LOB. Educated pt and daughter on pacemaker precautions (handout provided) and on how to donn/doff shirts and sling safely with L UE restrictions and to have assistance for cooking and chores etc. Educated them on progressing mobility with at least x3 walks per day. They verbalized understanding. All education completed and questions answered. PT will sign off. ?   ? ?Recommendations for follow up therapy are one component of a multi-disciplinary discharge planning process, led by the attending physician.  Recommendations may be updated based on patient status, additional functional criteria and insurance authorization. ? ?Follow Up Recommendations No PT follow up ? ?  ?Assistance Recommended at Discharge PRN  ?Patient can return home with the following ? Assistance with cooking/housework;Assist for transportation;A little help with bathing/dressing/bathroom ? ?  ?Equipment Recommendations None recommended by PT  ?Recommendations for Other Services ?    ?  ?Functional Status Assessment Patient has not had a recent decline in their functional status  ? ?  ?Precautions /  Restrictions Precautions ?Precautions: ICD/Pacemaker;Other (comment) ?Precaution Comments: watch HR ?Required Braces or Orthoses: Sling ?Restrictions ?Weight Bearing Restrictions: Yes ?LUE Weight Bearing: Non weight bearing ?Other Position/Activity Restrictions: pacemaker precautions L UE  ? ?  ? ?Mobility ? Bed Mobility ?Overal bed mobility: Modified Independent ?  ?  ?  ?  ?  ?  ?General bed mobility comments: Able to exit R side of bed with HOB elevated with extra time and no assistance. ?  ? ?Transfers ?Overall transfer level: Independent ?Equipment used: None ?  ?  ?  ?  ?  ?  ?  ?General transfer comment: Able to come to stand without assistance or LOB. ?  ? ?Ambulation/Gait ?Ambulation/Gait assistance: Supervision ?Gait Distance (Feet): 410 Feet ?Assistive device: None ?Gait Pattern/deviations: Step-through pattern, Decreased stride length ?Gait velocity: reduced ?Gait velocity interpretation: >2.62 ft/sec, indicative of community ambulatory ?  ?General Gait Details: Pt initially with slow, guarded gait holding UEs up at chest with decreased stride length. Cues provided to increase speed and relax arms by side, good success noted as distance progressed. No LOB, even with changing directions, speeds, and head position. Supervision for safety ? ?Stairs ?  ?  ?  ?  ?  ? ?Wheelchair Mobility ?  ? ?Modified Rankin (Stroke Patients Only) ?  ? ?  ? ?Balance Overall balance assessment: No apparent balance deficits (not formally assessed) ?  ?  ?  ?  ?  ?  ?  ?  ?  ?  ?  ?  ?  ?  ?  ?  ?  ?  ?   ? ? ? ?Pertinent Vitals/Pain Pain Assessment ?Pain Assessment: Faces ?Faces Pain Scale: Hurts a little  bit ?Pain Location: chest ?Pain Descriptors / Indicators: Discomfort ?Pain Intervention(s): Limited activity within patient's tolerance, Monitored during session, Repositioned  ? ? ?Home Living Family/patient expects to be discharged to:: Private residence ?Living Arrangements: Alone ?Available Help at Discharge:  Family;Available 24 hours/day ?Type of Home: House ?Home Access: Stairs to enter ?Entrance Stairs-Rails: None (pole on one side she can hold onto if needed) ?Entrance Stairs-Number of Steps: 1 ?  ?Home Layout: One level ?Home Equipment: Rollator (4 wheels);Shower seat - built in;Grab bars - tub/shower ?   ?  ?Prior Function Prior Level of Function : Independent/Modified Independent ?  ?  ?  ?  ?  ?  ?Mobility Comments: Does not use AD. ?ADLs Comments: Does not drive. ?  ? ? ?Hand Dominance  ? Dominant Hand: Right ? ?  ?Extremity/Trunk Assessment  ? Upper Extremity Assessment ?Upper Extremity Assessment: RUE deficits/detail;LUE deficits/detail ?RUE Deficits / Details: hx of chronic shoulder ROM deficits, only achieving ~30-45 degrees shoulder flexion AROM ?LUE Deficits / Details: limited mobility due to pacemaker precautions ?  ? ?Lower Extremity Assessment ?Lower Extremity Assessment: Overall WFL for tasks assessed ?  ? ?Cervical / Trunk Assessment ?Cervical / Trunk Assessment: Normal  ?Communication  ? Communication: No difficulties  ?Cognition Arousal/Alertness: Awake/alert ?[facility] During Therapy: [facility] for tasks assessed/performed ?Overall Cognitive Status: Within Functional Limits for tasks assessed ?  ?  ?  ?  ?  ?  ?  ?  ?  ?  ?  ?  ?  ?  ?  ?  ?  ?  ?  ? ?  ?General Comments General comments (skin integrity, edema, etc.): HR up to 127 bpm; educated pt and daughter on pacemaker precautions and on how to donn/doff shirts safely with L UE restrictions and to have assistance for cooking and chores etc. Educated them on progressing mobility with at least x3 walks per day. They verbalized understanding. ? ?  ?Exercises    ? ?Assessment/Plan  ?  ?PT Assessment Patient does not need any further PT services  ?PT Problem List   ? ?   ?  ?PT Treatment Interventions     ? ?PT Goals (Current goals can be found in the Care Plan section)  ?Acute Rehab PT Goals ?Patient Stated Goal: to go home and be IND ?PT Goal  Formulation: All assessment and education complete, DC therapy ?Time For Goal Achievement: 12/19/21 ?Potential to Achieve Goals: Good ? ?  ?Frequency   ?  ? ? ?Co-evaluation   ?  ?  ?  ?  ? ? ?  ?AM-PAC PT "6 Clicks" Mobility  ?Outcome Measure Help needed turning from your back to your side while in a flat bed without using bedrails?: None ?Help needed moving from lying on your back to sitting on the side of a flat bed without using bedrails?: None ?Help needed moving to and from a bed to a chair (including a wheelchair)?: None ?Help needed standing up from a chair using your arms (e.g., wheelchair or bedside chair)?: None ?Help needed to walk in hospital room?: A Little ?Help needed climbing 3-5 steps with a railing? : A Little ?6 Click Score: 22 ? ?  ?End of Session   ?Activity Tolerance: Patient tolerated treatment well ?Patient left: in chair;with call bell/phone within reach;with family/visitor present ?Nurse Communication: Mobility status ?PT Visit Diagnosis: Muscle weakness (generalized) (M62.81);Pain ?Pain - Right/Left:  (chest) ?Pain - part of body:  (chest) ?  ? ?Time: [id number] ?PT Time Calculation (  min) (ACUTE ONLY): 23 min ? ? ?Charges:   PT Evaluation ?$PT Eval Low Complexity: 1 Low ?PT Treatments ?$Therapeutic Activity: 8-22 mins ?  ?   ? ? ?Raymond Gurney, PT, DPT ?Acute Rehabilitation Services  ?Pager: [phone] ?Office: [phone] ? ? ?Henrene Dodge Pettis ?12/18/2021, 1:31 PM ? ?

## 2021-12-18 NOTE — Progress Notes (Signed)
Discharge instructions reviewed with pt and all questions answered at this time. IV lines & hospital equipment removed. All pt belongings gathered by pt. Pt discharged via wheelchair.  ?

## 2021-12-18 NOTE — Discharge Summary (Signed)
?Discharge Summary  ?  ?Patient ID: Sabrina Douglas ?MRN: [id number]; DOB: 1941/02/04 ? ?Admit date: 12/16/2021 ?Discharge date: 12/18/2021 ? ?PCP:  Isaac Bliss, Rayford Halsted, MD ?  ?[facility] Providers ?Cardiologist:  Dr. Haroldine Laws ? ?Electrophysiologist:  Will Meredith Leeds, MD     ? ? ?Discharge Diagnoses  ?  ?Principal Problem: ?  Complete heart block ([place]) ?Active Problems: ?  ILD (interstitial lung disease) ([place]) ?  Atrial fibrillation with RVR ([place]) ?  MCTD (mixed connective tissue disease) ([place]) ?  CHB (complete heart block) ([facility]) ?  PVC (premature ventricular contraction) ? ? ? ?Diagnostic Studies/Procedures  ?  ?Echo 12/17/2021 ? 1. Left ventricular ejection fraction, by estimation, is 60 to 65%. The  ?left ventricle has normal function. The left ventricle has no regional  ?wall motion abnormalities. Left ventricular diastolic parameters are  ?indeterminate.  ? 2. Right ventricular systolic function is normal. The right ventricular  ?size is normal. There is normal pulmonary artery systolic pressure.  ? 3. Moderate mitral valve regurgitation.  ? 4. The aortic valve was not well visualized. Aortic valve regurgitation  ?is not visualized.  ? 5. Aortic normal ascending aorta.  ? 6. The inferior vena cava is normal in size with greater than 50%  ?respiratory variability, suggesting right atrial pressure of 3 mmHg.  ? ?Comparison(s): No significant change from prior study.  ? ? ?Pacemaker Implantation 12/17/2021 ? ?PREPROCEDURE DIAGNOSIS:  complete AV block ?   ?POSTPROCEDURE DIAGNOSIS:  complete AV block ?   ? PROCEDURES:  ? 1. Pacemaker implantation. ? ? ?RA/RV Lead Placement: ?The left axillary vein was therefore cannulated.  Through the left axillary vein, a [employer] model Tendril MRI [id number] (serial number  [id number]) right atrial lead and an [employer] model Tendril STS [id number] (serial number  [id number]) right ventricular lead were advanced with fluoroscopic visualization into the right  atrial appendage and right ventricular apex positions respectively.  Initial atrial lead P- waves measured 1.4 mV with impedance of 559 ohms and a threshold of 0.5 V at 0.5 msec.  Right ventricular lead R-waves measured 11.2 mV with an impedance of 635 ohms and a threshold of 0.7 V at 0.5 msec.  Both leads were secured to the pectoralis fascia using #2-0 silk over the suture sleeves.  ?  ?Device Placement: ? The leads were then connected to an [place] MRI  model [id number] (serial number  [id number] ) pacemaker.  The pocket was irrigated with copious gentamicin solution.  The pacemaker was then placed into the pocket.  The pocket was then closed in 3 layers with 2.0 Vicryl suture for the 3.0 Vicryl suture subcutaneous and subcuticular layers.  Steri-  Strips and a sterile dressing were then applied. EBL<49ml.  There were no early apparent complications.  ?   ?CONCLUSIONS:  ? 1. Successful implantation of a [employer] Medical Assurity MRI dual-chamber pacemaker for symptomatic bradycardia ? 2. No early apparent complications.  ?_____________ ?  ?History of Present Illness   ?  ?Sabrina Douglas is a 81 y.o. 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 ? ?[name] was seen by Dr. Mahalia Longest today to establish care. Ziopatch was placed today to assess burden of arrhythmia. Of note, "Monitoring service call and pt with several episodes of CHB. HR in 22s  still with occ PVCs", patient was asked to come to the hospital for further evaluation. ?  ?Patient is seen at bedside with her two daughters. Patient did not feel anything different today. She has chronic weakness, dyspnea and fatigue. She says she started feeling more lightheaded, dizzy, generalized weakness for 2-3 weeks. She also  experiences periodic worsening shortness of breath followed by upper abdominal discomfort. Denies fever, chills, syncope, cough,  heart palpitations,nausea, vomiting, PND, early satiety, abdominal fullness, dysuria, diarrhea, pedal edema or any bleeding events. ?  ?She says she took all her medications today except for eliquis night dose. Patient reports her last time symptomatic atrial fibrillation was at least a year ago. She has not taken metoprolol for at least one year. She denies hx of tobacco and alcohol abuse. ?  ?On admission, ?Cr 1.0, K 4.0, Mg 2.1, BNP 162.4, troponin 18, WBC 4.1, Hgb 13.9 ?CXR no active disease ?ECGs demonstrated sinus rhythm, ventricular bigeminy, 2:1 AV block  ?  ? ?Hospital Course  ?   ?Consultants: EP service/CHF service  ? ?Patient was admitted for evaluation of complete heart block.  Overnight, patient was admitted to cardiac ICU.  She was not on any AV nodal blocking agent.  Anticoagulation therapy was held in anticipation of pacemaker implantation.  Patient was seen by Dr. Curt Bears 12/17/2021 who recommended proceeding with pacemaker placement. Echocardiogram performed on 12/17/2021 showed EF 60 to 65%, normal RV, normal pulmonary artery systolic pressure, moderate MR.  A [employer] Jude Medical Assurity MRI dual-chamber pacemaker was placed by Dr. Curt Bears on the same day. ? ?Patient was seen in the morning of 12/18/2021 at which time she was doing well.  Chest x-ray was performed which showed no sign of pulmonary edema or complication after pacemaker implantation.  Device check prior to discharge was also normal.  Dr. Quentin Ore recommended holding anticoagulation for 5 days and restarting on 12/23/2021. Patient was deemed stable for discharge after PT evaluation as they recommended no PT follow-up. ? ?Did the patient have an acute coronary syndrome (MI, NSTEMI, STEMI, etc) this admission?:  No                               ?Did the patient have a percutaneous coronary intervention (stent /  angioplasty)?:  No.   ? ? ?_____________ ? ?Discharge Vitals ?Blood pressure (!) 119/46, pulse 85, temperature 98 ?F (36.7 ?C), temperature source Oral, resp. rate 20, weight 72.4 kg, SpO2 100 %.  ?Filed Weights  ? 12/16/21 2017 12/18/21 0500  ?Weight: 71.9 kg 72.4 kg  ? ? ?Labs & Radiologic Studies  ?  ?CBC ?Recent Labs  ?  12/16/21 ?2032 12/18/21 ?0320  ?WBC 4.1 4.7  ?NEUTROABS 2.5  --   ?HGB 13.9 12.8  ?HCT 44.0 40.4  ?MCV 94.8 96.0  ?PLT 279 228  ? ?Basic Metabolic Panel ?Recent Labs  ?  12/16/21 ?2032 12/18/21 ?0320  ?NA 140 139  ?K 4.0 3.9  ?CL 105 103  ?CO2 25 30  ?GLUCOSE 115* 117*  ?BUN 13 16  ?CREATININE 1.00 1.01*  ?CALCIUM 9.9 9.2  ?MG 2.1 2.1  ? ?Liver Function Tests ?Recent Labs  ?  12/16/21 ?2032  ?AST 26  ?ALT 11  ?ALKPHOS 85  ?BILITOT 0.7  ?PROT 8.9*  ?ALBUMIN 4.3  ? ?No results for input(s): LIPASE, AMYLASE in the last 72 hours. ?High Sensitivity Troponin:   ?Recent Labs  ?Lab 12/16/21 ?2032 12/16/21 ?2237  ?  TROPONINIHS 18* 36*  ?  ?BNP ?Invalid input(s): POCBNP ?D-Dimer ?No results for input(s): DDIMER in the last 72 hours. ?Hemoglobin A1C ?No results for input(s): HGBA1C in the last 72 hours. ?Fasting Lipid Panel ?No results for input(s): CHOL, HDL, LDLCALC, TRIG, CHOLHDL, LDLDIRECT in the last 72 hours. ?Thyroid Function Tests ?Recent Labs  ?  12/16/21 ?2237  ?TSH 4.364  ? ?_____________  ?DG Chest 2 View ? ?Result Date: 12/18/2021 ?CLINICAL DATA:  Placement of cardiac pacemaker EXAM: CHEST - 2 VIEW COMPARISON:  Previous studies including the examination of 12/16/2021 FINDINGS: There is interval placement of pacemaker battery in the left infraclavicular region with tips of leads in right atrium and right ventricle. Transverse diameter of heart is increased. There are no signs of pulmonary edema or focal pulmonary consolidation. Small linear densities in the lower lung fields suggest minimal subsegmental atelectasis. There is no pleural effusion or pneumothorax. IMPRESSION: Interval placement of  pacemaker battery in the left infraclavicular region. There are no signs of pulmonary edema or focal pulmonary consolidation. Electronically Signed   By: Elmer Picker M.D.   On: 12/18/2021 12:19  ? ?

## 2021-12-18 NOTE — Progress Notes (Signed)
? ?Progress Note ? ?Patient Name: Sabrina Douglas ?Date of Encounter: 12/18/2021 ? ?[facility] Cardiologist: None  ? ?Subjective  ? ?NAEO. Doing well after PPM implant. Needs to ambulate. Family at bedside. ? ?Inpatient Medications  ?  ?Scheduled Meds: ? Chlorhexidine Gluconate Cloth  6 each Topical Daily  ? furosemide  40 mg Oral Daily  ? multivitamin with minerals  1 tablet Oral Daily  ? mycophenolate  500 mg Oral BID  ? pantoprazole  40 mg Oral Daily  ? senna  1 tablet Oral BID  ? sodium chloride flush  3 mL Intravenous Q12H  ? sodium chloride flush  3 mL Intravenous Q12H  ? ?Continuous Infusions: ?  ceFAZolin (ANCEF) IV    ? ?PRN Meds: ?acetaminophen **OR** acetaminophen, atropine, hydrALAZINE, melatonin, ondansetron **OR** ondansetron (ZOFRAN) IV, polyvinyl alcohol  ? ?Vital Signs  ?  ?Vitals:  ? 12/18/21 0300 12/18/21 0400 12/18/21 0500 12/18/21 0600  ?BP: 107/70     ?Pulse:      ?Resp: 19 14 18  (!) 25  ?Temp:      ?TempSrc:      ?SpO2:      ?Weight:   72.4 kg   ? ? ?Intake/Output Summary (Last 24 hours) at 12/18/2021 0750 ?Last data filed at 12/18/2021 0600 ?Gross per 24 hour  ?Intake 3 ml  ?Output 700 ml  ?Net -697 ml  ? ? ?  12/18/2021  ?  5:00 AM 12/16/2021  ?  8:17 PM 12/16/2021  ? 11:14 AM  ?Last 3 Weights  ?Weight (lbs) 159 lb 9.8 oz 158 lb 9.6 oz 158 lb 6.4 oz  ?Weight (kg) 72.4 kg 71.94 kg 71.85 kg  ?   ? ?Telemetry  ?  ?paced - Personally Reviewed ? ? ?Physical Exam  ? ?GEN: No acute distress.   ?Neck: No JVD ?Cardiac: RRR, no murmurs, rubs, or gallops. L prepectoral pocket without hematoma or significant pain. ?Respiratory: Clear to auscultation bilaterally. ?GI: Soft, nontender, non-distended  ?MS: No edema; No deformity. ?Neuro:  Nonfocal  ?Psych: Normal affect  ? ?Labs  ?  ?High Sensitivity Troponin:   ?Recent Labs  ?Lab 12/16/21 ?2032 12/16/21 ?2237  ?TROPONINIHS 18* 36*  ?   ?Chemistry ?Recent Labs  ?Lab 12/16/21 ?2032 12/18/21 ?0320  ?NA 140 139  ?K 4.0 3.9  ?CL 105 103  ?CO2 25 30   ?GLUCOSE 115* 117*  ?BUN 13 16  ?CREATININE 1.00 1.01*  ?CALCIUM 9.9 9.2  ?MG 2.1 2.1  ?PROT 8.9*  --   ?ALBUMIN 4.3  --   ?AST 26  --   ?ALT 11  --   ?ALKPHOS 85  --   ?BILITOT 0.7  --   ?GFRNONAA 56* 56*  ?ANIONGAP 10 6  ?  ?Lipids No results for input(s): CHOL, TRIG, HDL, LABVLDL, LDLCALC, CHOLHDL in the last 168 hours.  ?Hematology ?Recent Labs  ?Lab 12/16/21 ?2032 12/18/21 ?0320  ?WBC 4.1 4.7  ?RBC 4.64 4.21  ?HGB 13.9 12.8  ?HCT 44.0 40.4  ?MCV 94.8 96.0  ?MCH 30.0 30.4  ?MCHC 31.6 31.7  ?RDW 12.1 12.1  ?PLT 279 228  ? ?Thyroid  ?Recent Labs  ?Lab 12/16/21 ?2237  ?TSH 4.364  ?  ?BNP ?Recent Labs  ?Lab 12/16/21 ?2032  ?BNP 162.4*  ?  ?DDimer No results for input(s): DDIMER in the last 168 hours.  ? ?Radiology  ?  ?EP PPM/ICD IMPLANT ? ?Result Date: 12/17/2021 ?SURGEON:  Will Curt Bears, MD   PREPROCEDURE DIAGNOSIS:  complete AV block  POSTPROCEDURE DIAGNOSIS:  complete AV block    PROCEDURES:  1. Pacemaker implantation.   INTRODUCTION:  Sabrina Douglas is a 81 y.o. female with a history of bradycardia who presents today for pacemaker implantation.  The patient reports intermittent episodes of dizziness over the past few months.  No reversible causes have been identified.  The patient therefore presents today for pacemaker implantation.   DESCRIPTION OF PROCEDURE:  Informed written consent was obtained, and  the patient was brought to the electrophysiology lab in a fasting state.  The patient required no sedation for the procedure today.  The patients left chest was prepped and draped in the usual sterile fashion by the EP lab staff. The skin overlying the left deltopectoral region was infiltrated with lidocaine for local analgesia.  A 4-cm incision was made over the left deltopectoral region.  A left subcutaneous pacemaker pocket was fashioned using a combination of sharp and blunt dissection. Electrocautery was required to assure hemostasis.    RA/RV Lead Placement: The left axillary vein was therefore  cannulated.  Through the left axillary vein, a [employer] model Tendril MRI [id number] (serial number  [id number]) right atrial lead and an [employer] model Tendril STS [phone] (serial number  [id number]) right ventricular lead were advanced with fluoroscopic visualization into the right atrial appendage and right ventricular apex positions respectively.  Initial atrial lead P- waves measured 1.4 mV with impedance of 559 ohms and a threshold of 0.5 V at 0.5 msec.  Right ventricular lead R-waves measured 11.2 mV with an impedance of 635 ohms and a threshold of 0.7 V at 0.5 msec.  Both leads were secured to the pectoralis fascia using #2-0 silk over the suture sleeves. Device Placement:  The leads were then connected to an [place] MRI  model [id number] (serial number  [id number] ) pacemaker.  The pocket was irrigated with copious gentamicin solution.  The pacemaker was then placed into the pocket.  The pocket was then closed in 3 layers with 2.0 Vicryl suture for the 3.0 Vicryl suture subcutaneous and subcuticular layers.  Steri-  Strips and a sterile dressing were then applied. EBL<57ml.  There were no early apparent complications.   CONCLUSIONS:  1. Successful implantation of a [employer] Jude Medical Assurity MRI dual-chamber pacemaker for symptomatic bradycardia  2. No early apparent complications.       Will Curt Bears, MD 12/17/2021 5:05 PM  ? ?DG Chest Portable 1 View ? ?Result Date: 12/16/2021 ?CLINICAL DATA:  Dyspnea EXAM: PORTABLE CHEST 1 VIEW COMPARISON:  05/19/2020 FINDINGS: The heart size and mediastinal contours are within normal limits. Both lungs are clear. The visualized skeletal structures are unremarkable. Cardiac monitor noted. IMPRESSION: No active disease. Electronically Signed   By: Fidela Salisbury M.D.   On: 12/16/2021 21:03  ? ?ECHOCARDIOGRAM COMPLETE ? ?Result Date: 12/17/2021 ?   ECHOCARDIOGRAM REPORT   Patient Name:   Sabrina Douglas [facility] Date of Exam: 12/17/2021 Medical Rec #:  [id number]              Height:       64.0 in Accession #:    [id number]            Weight:       158.6 lb Date of Birth:  04-26-1941            BSA:          1.773 m? Patient Age:    81 years  BP:           119/61 mmHg Patient Gender: F                     HR:           46 bpm. Exam Location:  Inpatient Procedure: 2D Echo, Color Doppler and Cardiac Doppler Indications:    I44.2 Complete heart block  History:        Patient has prior history of Echocardiogram examinations, most                 recent 05/13/2021. Pulmonary HTN, Arrythmias:Atrial Fibrillation;                 Risk Factors:Hypertension and Dyslipidemia.  Sonographer:    Raquel Sarna Senior RDCS Referring Phys: [id number] FAN YE  Sonographer Comments: Poor parasternal window due to lung disease, subcostal performed around zoll pads. IMPRESSIONS  1. Left ventricular ejection fraction, by estimation, is 60 to 65%. The left ventricle has normal function. The left ventricle has no regional wall motion abnormalities. Left ventricular diastolic parameters are indeterminate.  2. Right ventricular systolic function is normal. The right ventricular size is normal. There is normal pulmonary artery systolic pressure.  3. Moderate mitral valve regurgitation.  4. The aortic valve was not well visualized. Aortic valve regurgitation is not visualized.  5. Aortic normal ascending aorta.  6. The inferior vena cava is normal in size with greater than 50% respiratory variability, suggesting right atrial pressure of 3 mmHg. Comparison(s): No significant change from prior study. FINDINGS  Left Ventricle: Left ventricular ejection fraction, by estimation, is 60 to 65%. The left ventricle has normal function. The left ventricle has no regional wall motion abnormalities. The left ventricular internal cavity size was normal in size. There is  no left ventricular hypertrophy. Left ventricular diastolic parameters are indeterminate. Right Ventricle: The right ventricular size is normal. Right  ventricular systolic function is normal. There is normal pulmonary artery systolic pressure. The tricuspid regurgitant velocity is 2.50 m/s, and with an assumed right atrial pressure of 3 mmHg,  the estimated rig

## 2021-12-18 NOTE — Plan of Care (Signed)
  Problem: Health Behavior/Discharge Planning: Goal: Ability to manage health-related needs will improve Outcome: Progressing   Problem: Clinical Measurements: Goal: Ability to maintain clinical measurements within normal limits will improve Outcome: Progressing Goal: Will remain free from infection Outcome: Progressing Goal: Diagnostic test results will improve Outcome: Progressing Goal: Respiratory complications will improve Outcome: Progressing Goal: Cardiovascular complication will be avoided Outcome: Progressing   Problem: Activity: Goal: Risk for activity intolerance will decrease Outcome: Progressing   Problem: Nutrition: Goal: Adequate nutrition will be maintained Outcome: Progressing   Problem: Coping: Goal: Level of anxiety will decrease Outcome: Progressing   Problem: Safety: Goal: Ability to remain free from injury will improve Outcome: Progressing   Problem: Skin Integrity: Goal: Risk for impaired skin integrity will decrease Outcome: Progressing   

## 2021-12-20 ENCOUNTER — Encounter (HOSPITAL_COMMUNITY): Payer: Self-pay | Admitting: Cardiology

## 2021-12-20 MED FILL — Lidocaine HCl Local Inj 1%: INTRAMUSCULAR | Qty: 60 | Status: AC

## 2021-12-23 ENCOUNTER — Encounter (HOSPITAL_COMMUNITY): Payer: Self-pay | Admitting: Internal Medicine

## 2021-12-24 ENCOUNTER — Other Ambulatory Visit: Payer: Self-pay | Admitting: Internal Medicine

## 2021-12-24 DIAGNOSIS — K219 Gastro-esophageal reflux disease without esophagitis: Secondary | ICD-10-CM

## 2021-12-30 ENCOUNTER — Ambulatory Visit (INDEPENDENT_AMBULATORY_CARE_PROVIDER_SITE_OTHER): Payer: Medicare PPO

## 2021-12-30 DIAGNOSIS — I442 Atrioventricular block, complete: Secondary | ICD-10-CM

## 2021-12-30 LAB — CUP PACEART INCLINIC DEVICE CHECK
Battery Remaining Longevity: 69 mo
Battery Voltage: 3.02 V
Brady Statistic RA Percent Paced: 3.2 %
Brady Statistic RV Percent Paced: 99.94 %
Date Time Interrogation Session: 20230525102800
Implantable Lead Implant Date: 20230512
Implantable Lead Implant Date: 20230512
Implantable Lead Location: 753859
Implantable Lead Location: 753860
Implantable Pulse Generator Implant Date: 20230512
Lead Channel Impedance Value: 425 Ohm
Lead Channel Impedance Value: 425 Ohm
Lead Channel Pacing Threshold Amplitude: 0.75 V
Lead Channel Pacing Threshold Amplitude: 0.75 V
Lead Channel Pacing Threshold Amplitude: 0.75 V
Lead Channel Pacing Threshold Amplitude: 0.75 V
Lead Channel Pacing Threshold Pulse Width: 0.4 ms
Lead Channel Pacing Threshold Pulse Width: 0.4 ms
Lead Channel Pacing Threshold Pulse Width: 0.4 ms
Lead Channel Pacing Threshold Pulse Width: 0.4 ms
Lead Channel Sensing Intrinsic Amplitude: 12 mV
Lead Channel Sensing Intrinsic Amplitude: 2.4 mV
Lead Channel Setting Pacing Amplitude: 3.5 V
Lead Channel Setting Pacing Amplitude: 3.5 V
Lead Channel Setting Pacing Pulse Width: 0.4 ms
Lead Channel Setting Sensing Sensitivity: 2 mV
Pulse Gen Model: 2272
Pulse Gen Serial Number: 8063350

## 2021-12-30 NOTE — Progress Notes (Signed)
Wound check appointment. Steri-strips removed. Wound without redness or edema. Incision edges approximated, wound well healed. Normal device function. Thresholds, sensing, and impedances consistent with implant measurements. Device programmed at 3.5V/auto capture programmed on for extra safety margin until 3 month visit. Histogram distribution appropriate for patient and level of activity. AT/AF burden <1%. No high ventricular rates noted. Patient educated about wound care, arm mobility, lifting restrictions. ROV in 3 months with implanting physician.

## 2021-12-30 NOTE — Patient Instructions (Signed)

## 2022-01-17 DIAGNOSIS — I272 Pulmonary hypertension, unspecified: Secondary | ICD-10-CM | POA: Diagnosis not present

## 2022-01-17 DIAGNOSIS — J849 Interstitial pulmonary disease, unspecified: Secondary | ICD-10-CM | POA: Diagnosis not present

## 2022-02-14 ENCOUNTER — Ambulatory Visit: Payer: Medicare PPO | Admitting: Pulmonary Disease

## 2022-02-14 ENCOUNTER — Encounter: Payer: Self-pay | Admitting: Pulmonary Disease

## 2022-02-14 DIAGNOSIS — I4891 Unspecified atrial fibrillation: Secondary | ICD-10-CM

## 2022-02-14 DIAGNOSIS — J9611 Chronic respiratory failure with hypoxia: Secondary | ICD-10-CM | POA: Diagnosis not present

## 2022-02-14 DIAGNOSIS — J849 Interstitial pulmonary disease, unspecified: Secondary | ICD-10-CM | POA: Diagnosis not present

## 2022-02-14 NOTE — Assessment & Plan Note (Signed)
Status post PPM now. I have asked her to keep her heart retracted and discussed with cardiologist if low-dose beta-blocker needs to be added

## 2022-02-14 NOTE — Assessment & Plan Note (Signed)
Continue CellCept 500 twice daily. Disease appears to have stabilized on this medication and she is tolerating well, no intercurrent infections or side effects. She has weaned off prednisone. We will monitor with annual HRCT chest.  She does not like doing PFT maneuvers as much so we will hold off unless symptoms worsen

## 2022-02-14 NOTE — Patient Instructions (Addendum)
  HR is high today - check again at home & if high, discuss with cardiologist  Continue on cellcept

## 2022-02-14 NOTE — Assessment & Plan Note (Addendum)
Oxygen use only as needed and during sleep [facility]

## 2022-02-14 NOTE — Progress Notes (Signed)
   Subjective:    Patient ID: Sabrina Douglas, female    DOB: 12-18-1940, 81 y.o.   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    Sees Dr. Dierdre Forth -initially started on Plaquenil and 10 mg of prednisone, gradually tapered to off 02/2020 05/2020 started on CellCept      PMH - 10/2020 -new onset RBBB noted on EKG -felt to be conduction disease,  s/p PPM 12/2021 ,on Eliquis for paroxysmal A. Fib Seen by ENT for dysphagia  43-month follow-up visit Accompanied by daughter.  She had a hospital admission for dizziness and found to be bradycardic and required PPM placement. She is tachycardic to 119 today , states this occurs at the doctor's office.  She complains of nasal congestion and right face tenderness, mostly clear nasal discharge.  She reports occasional chronic cough especially around 11 AM in the mornings Breathing is stable.  She is tolerating CellCept well, no diarrhea, no intercurrent infections.  Complains of some stiffness of hands in the mornings We reviewed HRCT chest today   Significant tests/ events reviewed   PFTs 04/2020 -mild restriction, ratio 85, FEV1 92%, FVC 84%, TLC 65%, DLCO 62% >> much improved compared to prior     ANA positive 1: 1280, Sm Ab 2.0, RNP > 8.0 ,CCP negative    10/02/2018 PFTs ratio 92, moderate to severe restriction FEV1 66%, FVC 56%, TLC 46%, DLCO 17% corrects to 51% for alveolar volume   Echo 05/2021 normal RVSP Echo 09/2018 RVSP 67 Cath 10/2018 RA 3, PA 39/7 , CI 3.8 PVR2.1 Wu >> normal pulmonary pressures, high-output physiology     HRCT chest 11/2021 unchanged fibrosis "alternate" pattern HRCT 11/2020 Basilar predominant bronchiectasis, ground-glass and volume loss,progressive from 05/22/2019 " alternative " (not UIP ) ? Fibrotic NSIP   HRCT 05/2019 Bilateral lower lobe predominant  bronchiectasis and volume loss may be postinfectious or post inflammatory in etiology HRCT 09/10/2018 -probable UIP 09/2018 VQ scan very low probability Esophagram 09/2018 smooth narrowing of distal esophagus?  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 Also denies presyncope, palpitations, heartburn, abdominal pain, nausea, vomiting, diarrhea or change in bowel or urinary habits, dysuria,hematuria, rash, arthralgias, visual complaints, headache, numbness weakness or ataxia.     Objective:   Physical Exam  Gen. Pleasant, obese, in no distress ENT - no lesions, no post nasal drip Neck: No JVD, no thyromegaly, no carotid bruits Lungs: no use of accessory muscles, no dullness to percussion, bibasal dry rales no rhonchi  Cardiovascular: Rhythm regular, heart sounds  normal, no murmurs or gallops, no peripheral edema Musculoskeletal: No deformities, no cyanosis or clubbing , no tremors       Assessment & Plan:

## 2022-02-16 DIAGNOSIS — I272 Pulmonary hypertension, unspecified: Secondary | ICD-10-CM | POA: Diagnosis not present

## 2022-02-16 DIAGNOSIS — J849 Interstitial pulmonary disease, unspecified: Secondary | ICD-10-CM | POA: Diagnosis not present

## 2022-03-19 DIAGNOSIS — J849 Interstitial pulmonary disease, unspecified: Secondary | ICD-10-CM | POA: Diagnosis not present

## 2022-03-19 DIAGNOSIS — I272 Pulmonary hypertension, unspecified: Secondary | ICD-10-CM | POA: Diagnosis not present

## 2022-03-21 ENCOUNTER — Ambulatory Visit: Payer: Medicare PPO

## 2022-03-21 ENCOUNTER — Ambulatory Visit (INDEPENDENT_AMBULATORY_CARE_PROVIDER_SITE_OTHER): Payer: Medicare PPO | Admitting: Cardiology

## 2022-03-21 ENCOUNTER — Encounter: Payer: Self-pay | Admitting: Cardiology

## 2022-03-21 VITALS — BP 134/78 | HR 93 | Ht 64.0 in | Wt 156.0 lb

## 2022-03-21 DIAGNOSIS — I442 Atrioventricular block, complete: Secondary | ICD-10-CM

## 2022-03-21 NOTE — Progress Notes (Signed)
Electrophysiology Office Note   Date:  03/21/2022   ID:  Sabrina, [name], Sabrina Douglas, MRN [id number]  PCP:  Philip Aspen, Limmie Patricia, MD  Cardiologist:  Bensimhon Primary Electrophysiologist:  Ali Mohl Jorja Loa, MD    Chief Complaint: heart block   History of Present Illness: Sabrina Douglas is a 81 y.o. female who is being seen today for the evaluation of heart block at the request of Philip Aspen, Almira Bar*. Presenting today for electrophysiology evaluation.  She has a history significant for atrial fibrillation, PVCs, ILD, hypertension, hyperlipidemia.  She presented to the hospital 12/16/2021 with complete heart block.  She is status post Abbott dual-chamber pacemaker implanted 12/17/2021.  Today, she denies symptoms of palpitations, chest pain, shortness of breath, orthopnea, PND, lower extremity edema, claudication, dizziness, presyncope, syncope, bleeding, or neurologic sequela. The patient is tolerating medications without difficulties.  Shortness of breath is about at his baseline.  She is on oxygen at home due to her ILD.  Aside from that, she has no major complaints.   Past Medical History:  Diagnosis Date   Anxiety    CHF (congestive heart failure) (HCC)    Depression    Hyperlipidemia    Past Surgical History:  Procedure Laterality Date   BALLOON DILATION N/A 10/04/2018   Procedure: BALLOON DILATION;  Surgeon: Kathi Der, MD;  Location: [facility];  Service: Gastroenterology;  Laterality: N/A;   BIOPSY  10/04/2018   Procedure: BIOPSY;  Surgeon: Kathi Der, MD;  Location: [facility];  Service: Gastroenterology;;   CATARACT EXTRACTION     ESOPHAGOGASTRODUODENOSCOPY (EGD) WITH PROPOFOL N/A 10/04/2018   Procedure: ESOPHAGOGASTRODUODENOSCOPY (EGD) WITH PROPOFOL;  Surgeon: Kathi Der, MD;  Location: [facility];  Service: Gastroenterology;  Laterality: N/A;   PACEMAKER IMPLANT N/A 12/17/2021   Procedure: PACEMAKER IMPLANT;   Surgeon: Regan Lemming, MD;  [facility];  Service: Cardiovascular;  Laterality: N/A;   RIGHT HEART CATH N/A 10/15/2018   Procedure: RIGHT HEART CATH;  Surgeon: Dolores Patty, MD;  [facility];  Service: Cardiovascular;  Laterality: N/A;     Current Outpatient Medications  Medication Sig Dispense Refill   acetaminophen (TYLENOL) 325 MG tablet Take 325 mg by mouth every 6 (six) hours as needed for moderate pain or headache.     cholecalciferol (VITAMIN D3) 25 MCG (1000 UNIT) tablet Take 1 tablet (1,000 Units total) by mouth daily.     ELIQUIS 5 MG TABS tablet TAKE 1 TABLET(5 MG) BY MOUTH TWICE DAILY (Patient taking differently: Take 5 mg by mouth 2 (two) times daily.) 60 tablet 11   fexofenadine (ALLEGRA) 180 MG tablet Take 180 mg by mouth daily as needed for allergies or rhinitis.     furosemide (LASIX) 40 MG tablet Take 1 tablet (40 mg total) by mouth daily. (Patient taking differently: Take 40 mg by mouth daily. 1:30 pm or 2 pm) 90 tablet 1   Multiple Vitamin (MULTIVITAMIN WITH MINERALS) TABS tablet Take 1 tablet by mouth daily.     mycophenolate (CELLCEPT) 500 MG tablet Take 500 mg by mouth 2 (two) times daily.     OXYGEN Inhale 1.5 L into the lungs See admin instructions. At bedtime  During the day as needed for shortness of breath     pantoprazole (PROTONIX) 40 MG tablet TAKE 1 TABLET BY MOUTH EVERY DAY 90 tablet 1   RESTASIS 0.05 % ophthalmic emulsion Place 1 drop into both eyes 2 (two) times daily. 8:30 am and 6:30  pm     SYSTANE ULTRA 0.4-0.3 % SOLN Apply 1 drop to eye 2 (two) times daily. 12:30 pm and 8 pm     No current facility-administered medications for this visit.    Allergies:   Pedi-pre tape spray [wound dressing adhesive]   Social History:  The patient  reports that she has never smoked. She has been exposed to tobacco smoke. She has never used smokeless tobacco. She reports that she does not drink alcohol.   Family History:   The patient's family history includes Diabetes in her father; Prostate cancer in her father; Sudden death (age of onset: 8) in her mother.    ROS:  Please see the history of present illness.   Otherwise, review of systems is positive for none.   All other systems are reviewed and negative.    PHYSICAL EXAM: VS:  BP 134/78   Pulse 93   Ht 5\' 4"  (1.626 m)   Wt 156 lb (70.8 kg)   BMI 26.78 kg/m  , BMI Body mass index is 26.78 kg/m. GEN: Well nourished, well developed, in no acute distress  HEENT: normal  Neck: no JVD, carotid bruits, or masses Cardiac: RRR; no murmurs, rubs, or gallops,no edema  Respiratory:  clear to auscultation bilaterally, normal work of breathing GI: soft, nontender, nondistended, + BS MS: no deformity or atrophy  Skin: warm and dry, device pocket is well healed Neuro:  Strength and sensation are intact Psych: euthymic mood, full affect  EKG:  EKG is ordered today. Personal review of the ekg ordered shows sinus rhythm, rate 93  Device interrogation is reviewed today in detail.  See PaceArt for details.   Recent Labs: 12/16/2021: ALT 11; B Natriuretic Peptide 162.4; TSH 4.364 12/18/2021: BUN 16; Creatinine, Ser 1.01; Hemoglobin 12.8; Magnesium 2.1; Platelets 228; Potassium 3.9; Sodium 139    Lipid Panel     Component Value Date/Time   CHOL 196 10/05/2021 0946   TRIG 100.0 10/05/2021 0946   HDL 48.30 10/05/2021 0946   CHOLHDL 4 10/05/2021 0946   VLDL 20.0 10/05/2021 0946   LDLCALC 128 (H) 10/05/2021 0946   LDLCALC 114 (H) 03/25/2020 0925     Wt Readings from Last 3 Encounters:  08/14/Douglas 156 lb (70.8 kg)  07/10/Douglas 155 lb 6.4 oz (70.5 kg)  05/13/Douglas 159 lb 9.8 oz (72.4 kg)      Other studies Reviewed: Additional studies/ records that were reviewed today include: TTE 5/12/Douglas  Review of the above records today demonstrates:   1. Left ventricular ejection fraction, by estimation, is 60 to 65%. The  left ventricle has normal function. The left  ventricle has no regional  wall motion abnormalities. Left ventricular diastolic parameters are  indeterminate.   2. Right ventricular systolic function is normal. The right ventricular  size is normal. There is normal pulmonary artery systolic pressure.   3. Moderate mitral valve regurgitation.   4. The aortic valve was not well visualized. Aortic valve regurgitation  is not visualized.   5. Aortic normal ascending aorta.   6. The inferior vena cava is normal in size with greater than 50%  respiratory variability, suggesting right atrial pressure of 3 mmHg.      ASSESSMENT AND PLAN:  1.  Complete heart block: Status post Abbott dual-chamber pacemaker implanted 12/17/2021.  Device function appropriately.  No changes at this time.  2.  Paroxysmal atrial fibrillation: CHA2DS2-VASc of at least 2.  Currently on Eliquis 5 mg twice daily.  Episodes noted on  pacemaker interrogation.  No changes.  Current medicines are reviewed at length with the patient today.   The patient does not have concerns regarding her medicines.  The following changes were made today:  none  Labs/ tests ordered today include:  Orders Placed This Encounter  Procedures   EKG 12-Lead     Disposition:   FU 1 year  Signed, Gurjot Brisco Jorja Loa, MD  03/21/2022 2:30 PM     [facility] [address] [place] [address] [phone] (office) [phone] (fax)

## 2022-03-23 LAB — CUP PACEART REMOTE DEVICE CHECK
Battery Remaining Longevity: 67 mo
Battery Remaining Percentage: 95.5 %
Battery Voltage: 2.98 V
Brady Statistic AP VP Percent: 4.4 %
Brady Statistic AP VS Percent: 1 %
Brady Statistic AS VP Percent: 96 %
Brady Statistic AS VS Percent: 1 %
Brady Statistic RA Percent Paced: 4.3 %
Brady Statistic RV Percent Paced: 99 %
Date Time Interrogation Session: 20230812020012
Implantable Lead Implant Date: 20230512
Implantable Lead Implant Date: 20230512
Implantable Lead Location: 753859
Implantable Lead Location: 753860
Implantable Pulse Generator Implant Date: 20230512
Lead Channel Impedance Value: 440 Ohm
Lead Channel Impedance Value: 460 Ohm
Lead Channel Pacing Threshold Amplitude: 0.75 V
Lead Channel Pacing Threshold Amplitude: 0.75 V
Lead Channel Pacing Threshold Pulse Width: 0.4 ms
Lead Channel Pacing Threshold Pulse Width: 0.4 ms
Lead Channel Sensing Intrinsic Amplitude: 12 mV
Lead Channel Sensing Intrinsic Amplitude: 2.6 mV
Lead Channel Setting Pacing Amplitude: 3.5 V
Lead Channel Setting Pacing Amplitude: 3.5 V
Lead Channel Setting Pacing Pulse Width: 0.4 ms
Lead Channel Setting Sensing Sensitivity: 2 mV
Pulse Gen Model: 2272
Pulse Gen Serial Number: 8063350

## 2022-03-24 DIAGNOSIS — I272 Pulmonary hypertension, unspecified: Secondary | ICD-10-CM | POA: Diagnosis not present

## 2022-03-24 DIAGNOSIS — I7300 Raynaud's syndrome without gangrene: Secondary | ICD-10-CM | POA: Diagnosis not present

## 2022-03-24 DIAGNOSIS — M351 Other overlap syndromes: Secondary | ICD-10-CM | POA: Diagnosis not present

## 2022-03-24 DIAGNOSIS — E663 Overweight: Secondary | ICD-10-CM | POA: Diagnosis not present

## 2022-03-24 DIAGNOSIS — J849 Interstitial pulmonary disease, unspecified: Secondary | ICD-10-CM | POA: Diagnosis not present

## 2022-03-24 DIAGNOSIS — Z6826 Body mass index (BMI) 26.0-26.9, adult: Secondary | ICD-10-CM | POA: Diagnosis not present

## 2022-03-24 DIAGNOSIS — I4891 Unspecified atrial fibrillation: Secondary | ICD-10-CM | POA: Diagnosis not present

## 2022-04-04 ENCOUNTER — Encounter: Payer: Self-pay | Admitting: Internal Medicine

## 2022-04-04 ENCOUNTER — Ambulatory Visit (INDEPENDENT_AMBULATORY_CARE_PROVIDER_SITE_OTHER): Payer: Medicare PPO | Admitting: Internal Medicine

## 2022-04-04 VITALS — BP 130/70 | HR 87 | Temp 98.5°F | Ht 66.5 in | Wt 155.2 lb

## 2022-04-04 DIAGNOSIS — I10 Essential (primary) hypertension: Secondary | ICD-10-CM

## 2022-04-04 DIAGNOSIS — E559 Vitamin D deficiency, unspecified: Secondary | ICD-10-CM

## 2022-04-04 DIAGNOSIS — R300 Dysuria: Secondary | ICD-10-CM | POA: Diagnosis not present

## 2022-04-04 DIAGNOSIS — K219 Gastro-esophageal reflux disease without esophagitis: Secondary | ICD-10-CM | POA: Diagnosis not present

## 2022-04-04 DIAGNOSIS — I272 Pulmonary hypertension, unspecified: Secondary | ICD-10-CM | POA: Diagnosis not present

## 2022-04-04 DIAGNOSIS — Z0000 Encounter for general adult medical examination without abnormal findings: Secondary | ICD-10-CM | POA: Diagnosis not present

## 2022-04-04 DIAGNOSIS — M351 Other overlap syndromes: Secondary | ICD-10-CM | POA: Diagnosis not present

## 2022-04-04 DIAGNOSIS — I4891 Unspecified atrial fibrillation: Secondary | ICD-10-CM | POA: Diagnosis not present

## 2022-04-04 DIAGNOSIS — I442 Atrioventricular block, complete: Secondary | ICD-10-CM

## 2022-04-04 DIAGNOSIS — J849 Interstitial pulmonary disease, unspecified: Secondary | ICD-10-CM | POA: Diagnosis not present

## 2022-04-04 LAB — URINALYSIS, ROUTINE W REFLEX MICROSCOPIC
Bilirubin Urine: NEGATIVE
Hgb urine dipstick: NEGATIVE
Ketones, ur: NEGATIVE
Nitrite: NEGATIVE
RBC / HPF: NONE SEEN (ref 0–?)
Specific Gravity, Urine: 1.02 (ref 1.000–1.030)
Total Protein, Urine: NEGATIVE
Urine Glucose: NEGATIVE
Urobilinogen, UA: 0.2 (ref 0.0–1.0)
pH: 6 (ref 5.0–8.0)

## 2022-04-04 LAB — CBC WITH DIFFERENTIAL/PLATELET
Basophils Absolute: 0 10*3/uL (ref 0.0–0.1)
Basophils Relative: 1 % (ref 0.0–3.0)
Eosinophils Absolute: 0.1 10*3/uL (ref 0.0–0.7)
Eosinophils Relative: 4.8 % (ref 0.0–5.0)
HCT: 39.4 % (ref 36.0–46.0)
Hemoglobin: 13 g/dL (ref 12.0–15.0)
Lymphocytes Relative: 20.1 % (ref 12.0–46.0)
Lymphs Abs: 0.6 10*3/uL — ABNORMAL LOW (ref 0.7–4.0)
MCHC: 33 g/dL (ref 30.0–36.0)
MCV: 93.7 fl (ref 78.0–100.0)
Monocytes Absolute: 0.4 10*3/uL (ref 0.1–1.0)
Monocytes Relative: 14.4 % — ABNORMAL HIGH (ref 3.0–12.0)
Neutro Abs: 1.8 10*3/uL (ref 1.4–7.7)
Neutrophils Relative %: 59.7 % (ref 43.0–77.0)
Platelets: 259 10*3/uL (ref 150.0–400.0)
RBC: 4.21 Mil/uL (ref 3.87–5.11)
RDW: 12.4 % (ref 11.5–15.5)
WBC: 3 10*3/uL — ABNORMAL LOW (ref 4.0–10.5)

## 2022-04-04 LAB — POCT URINALYSIS DIPSTICK
Bilirubin, UA: NEGATIVE
Blood, UA: NEGATIVE
Glucose, UA: NEGATIVE
Nitrite, UA: NEGATIVE
Protein, UA: POSITIVE — AB
Spec Grav, UA: 1.015 (ref 1.010–1.025)
Urobilinogen, UA: 0.2 E.U./dL
pH, UA: 5.5 (ref 5.0–8.0)

## 2022-04-04 LAB — COMPREHENSIVE METABOLIC PANEL
ALT: 7 U/L (ref 0–35)
AST: 22 U/L (ref 0–37)
Albumin: 4.3 g/dL (ref 3.5–5.2)
Alkaline Phosphatase: 90 U/L (ref 39–117)
BUN: 14 mg/dL (ref 6–23)
CO2: 30 mEq/L (ref 19–32)
Calcium: 9.6 mg/dL (ref 8.4–10.5)
Chloride: 99 mEq/L (ref 96–112)
Creatinine, Ser: 0.81 mg/dL (ref 0.40–1.20)
GFR: 67.42 mL/min (ref >60.00)
Glucose, Bld: 105 mg/dL — ABNORMAL HIGH (ref 70–99)
Potassium: 3.9 mEq/L (ref 3.5–5.1)
Sodium: 138 mEq/L (ref 135–145)
Total Bilirubin: 0.4 mg/dL (ref 0.2–1.2)
Total Protein: 8.2 g/dL (ref 6.0–8.3)

## 2022-04-04 LAB — LIPID PANEL
Cholesterol: 212 mg/dL — ABNORMAL HIGH (ref 0–200)
HDL: 46.6 mg/dL (ref >39.00)
LDL Cholesterol: 143 mg/dL — ABNORMAL HIGH (ref 0–99)
NonHDL: 165.06
Total CHOL/HDL Ratio: 5
Triglycerides: 112 mg/dL (ref 0.0–149.0)
VLDL: 22.4 mg/dL (ref 0.0–40.0)

## 2022-04-04 LAB — VITAMIN B12: Vitamin B-12: 435 pg/mL (ref 211–911)

## 2022-04-04 LAB — VITAMIN D 25 HYDROXY (VIT D DEFICIENCY, FRACTURES): VITD: 49.28 ng/mL (ref 30.00–100.00)

## 2022-04-04 LAB — HEMOGLOBIN A1C: Hgb A1c MFr Bld: 5.8 % (ref 4.6–6.5)

## 2022-04-04 LAB — TSH: TSH: 2.61 u[IU]/mL (ref 0.35–5.50)

## 2022-04-04 MED ORDER — PANTOPRAZOLE SODIUM 40 MG PO TBEC: 40.0000 mg | DELAYED_RELEASE_TABLET | Freq: Every day | ORAL | 1 refills | Status: DC

## 2022-04-04 MED ORDER — NITROFURANTOIN MONOHYD MACRO 100 MG PO CAPS: 100.0000 mg | ORAL_CAPSULE | Freq: Two times a day (BID) | ORAL | 0 refills | Status: AC

## 2022-04-04 NOTE — Patient Instructions (Signed)
-  Nice seeing you today!!  -Lab work today; will notify you once results are available.  -Consider flu, tdap, shingles and RSV vaccines at your pharmacy.  -Schedule follow up in 6 months or sooner as needed.

## 2022-04-04 NOTE — Progress Notes (Signed)
Established Patient Office Visit     CC/Reason for Visit: Annual preventive exam, subsequent Medicare wellness visit, discuss acute concerns  HPI: Sabrina Douglas is a 81 y.o. female who is coming in today for the above mentioned reasons. Past Medical History is significant for: Well-controlled hypertension and hyperlipidemia, history of A. fib that is rate controlled followed by cardiology.  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 Vassie Loll and Dr. Dierdre Forth.  She has been experiencing some dysuria and would like her urine checked today.  She was treated in February for UTI.  She has routine eye care, she does not see a dentist as she has no natural teeth.  No perceived hearing difficulties.  She is overdue for flu, bivalent COVID, shingles, Tdap, we also need to give consideration to the new RSV vaccine.  She elects to defer all cancer screening due to age and comorbidities.  In May she was found to have a complete heart block and was admitted to the hospital for pacemaker placement.   Past Medical/Surgical History: Past Medical History:  Diagnosis Date   Anxiety    CHF (congestive heart failure) (HCC)    Depression    Hyperlipidemia     Past Surgical History:  Procedure Laterality Date   BALLOON DILATION N/A 10/04/2018   Procedure: BALLOON DILATION;  Surgeon: Kathi Der, MD;  Location: [facility];  Service: Gastroenterology;  Laterality: N/A;   BIOPSY  10/04/2018   Procedure: BIOPSY;  Surgeon: Kathi Der, MD;  Location: [facility];  Service: Gastroenterology;;   CATARACT EXTRACTION     ESOPHAGOGASTRODUODENOSCOPY (EGD) WITH PROPOFOL N/A 10/04/2018   Procedure: ESOPHAGOGASTRODUODENOSCOPY (EGD) WITH PROPOFOL;  Surgeon: Kathi Der, MD;  Location: [facility];  Service: Gastroenterology;  Laterality: N/A;   PACEMAKER IMPLANT N/A 12/17/2021   Procedure: PACEMAKER  IMPLANT;  Surgeon: Regan Lemming, MD;  [facility];  Service: Cardiovascular;  Laterality: N/A;   RIGHT HEART CATH N/A 10/15/2018   Procedure: RIGHT HEART CATH;  Surgeon: Dolores Patty, MD;  [facility];  Service: Cardiovascular;  Laterality: N/A;    Social History:  reports that she has never smoked. She has been exposed to tobacco smoke. She has never used smokeless tobacco. She reports that she does not drink alcohol. No history on file for drug use.  Allergies: Allergies  Allergen Reactions   Pedi-Pre Tape Spray [Wound Dressing Adhesive] Other (See Comments)    Blisters and bruisers    Family History:  Family History  Problem Relation Age of Onset   Sudden death Mother 42       sudden death post hysterectomy   Prostate cancer Father    Diabetes Father      Current Outpatient Medications:    acetaminophen (TYLENOL) 325 MG tablet, Take 325 mg by mouth every 6 (six) hours as needed for moderate pain or headache., Disp: , Rfl:    cholecalciferol (VITAMIN D3) 25 MCG (1000 UNIT) tablet, Take 1 tablet (1,000 Units total) by mouth daily., Disp: , Rfl:    ELIQUIS 5 MG TABS tablet, TAKE 1 TABLET(5 MG) BY MOUTH TWICE DAILY (Patient taking differently: Take 5 mg by mouth 2 (two) times daily.), Disp: 60 tablet, Rfl: 11   fexofenadine (ALLEGRA) 180 MG tablet, Take 180 mg by mouth daily as needed for allergies or rhinitis., Disp: , Rfl:    furosemide (LASIX) 40 MG tablet, Take 1  tablet (40 mg total) by mouth daily. (Patient taking differently: Take 40 mg by mouth daily. 1:30 pm or 2 pm), Disp: 90 tablet, Rfl: 1   Multiple Vitamin (MULTIVITAMIN WITH MINERALS) TABS tablet, Take 1 tablet by mouth daily., Disp: , Rfl:    mycophenolate (CELLCEPT) 500 MG tablet, Take 500 mg by mouth 2 (two) times daily., Disp: , Rfl:    nitrofurantoin, macrocrystal-monohydrate, (MACROBID) 100 MG capsule, Take 1 capsule (100 mg total) by mouth 2 (two) times daily for 5  days., Disp: 10 capsule, Rfl: 0   OXYGEN, Inhale 1.5 L into the lungs See admin instructions. At bedtime  During the day as needed for shortness of breath, Disp: , Rfl:    RESTASIS 0.05 % ophthalmic emulsion, Place 1 drop into both eyes 2 (two) times daily. 8:30 am and 6:30 pm, Disp: , Rfl:    SYSTANE ULTRA 0.4-0.3 % SOLN, Apply 1 drop to eye 2 (two) times daily. 12:30 pm and 8 pm, Disp: , Rfl:    pantoprazole (PROTONIX) 40 MG tablet, Take 1 tablet (40 mg total) by mouth daily., Disp: 90 tablet, Rfl: 1  Review of Systems:  Constitutional: Denies fever, chills, diaphoresis, appetite change and fatigue.  HEENT: Denies photophobia, eye pain, redness, hearing loss, ear pain, congestion, sore throat, rhinorrhea, sneezing, mouth sores, trouble swallowing, neck pain, neck stiffness and tinnitus.   Respiratory: Denies SOB, DOE, cough, chest tightness,  and wheezing.   Cardiovascular: Denies chest pain, palpitations and leg swelling.  Gastrointestinal: Denies nausea, vomiting, abdominal pain, diarrhea, constipation, blood in stool and abdominal distention.  Genitourinary: Denies  urgency, frequency, hematuria, flank pain and difficulty urinating.  Endocrine: Denies: hot or cold intolerance, sweats, changes in hair or nails, polyuria, polydipsia. Musculoskeletal: Denies myalgias, back pain, joint swelling, arthralgias and gait problem.  Skin: Denies pallor, rash and wound.  Neurological: Denies dizziness, seizures, syncope, weakness, light-headedness, numbness and headaches.  Hematological: Denies adenopathy. Easy bruising, personal or family bleeding history  Psychiatric/Behavioral: Denies suicidal ideation, mood changes, confusion, nervousness, sleep disturbance and agitation    Physical Exam: Vitals:   04/04/22 0835 04/04/22 0841  BP: 130/80 130/70  Pulse: 87   Temp: 98.5 F (36.9 C)   TempSrc: Oral   SpO2: 97%   Weight: 155 lb 3.2 oz (70.4 kg)   Height: 5' 6.5" (1.689 m)     Body mass  index is 24.67 kg/m.   Constitutional: NAD, calm, comfortable Eyes: PERRL, lids and conjunctivae normal, wears corrective lenses ENMT: Mucous membranes are moist. Posterior pharynx clear of any exudate or lesions. Normal dentition. Tympanic membrane is pearly white, no erythema or bulging. Neck: normal, supple, no masses, no thyromegaly Respiratory: clear to auscultation bilaterally, mild crackles bilateral bases.  Normal respiratory effort. No accessory muscle use.  Cardiovascular: Regular rate and rhythm, no murmurs / rubs / gallops.  Trace bilateral pitting ankle extremity edema.  Abdomen: no tenderness, no masses palpated. No hepatosplenomegaly. Bowel sounds positive.  Musculoskeletal: no clubbing / cyanosis. No joint deformity upper and lower extremities. Good ROM, no contractures. Normal muscle tone.  Skin: no rashes, lesions, ulcers. No induration Neurologic: CN 2-12 grossly intact. Sensation intact, DTR normal. Strength 5/5 in all 4.  Psychiatric: Normal judgment and insight. Alert and oriented x 3. Normal mood.    Subsequent Medicare wellness visit   1. Risk factors, based on past  M,S,F -cardiovascular disease risk factors include age, history of hypertension, hyperlipidemia.   2.  Physical activities: Very sedentary other than ADLs  3.  Depression/mood: Stable, not depressed   4.  Hearing: No perceived issues   5.  ADL's: Independent in all ADLs   6.  Fall risk: Moderate fall risk   7.  Home safety: No problems identified   8.  Height weight, and visual acuity: height and weight as above, vision:  Vision Screening   Right eye Left eye Both eyes  Without correction     With correction 20/20 20/20 20/20      9.  Counseling: Advised that she update immunization status   10. Lab orders based on risk factors: Laboratory update will be reviewed   11. Referral : None today   12. Care plan: Follow-up with me in 6 months   13. Cognitive assessment: No cognitive  impairment   14. Screening: Patient provided with a written and personalized 5-10 year screening schedule in the AVS. yes   15. Provider List Update: PCP, cardiology, electrophysiology, pulmonary, rheumatology  16. Advance Directives: Full code   17. Opioids: Patient is not on any opioid prescriptions and has no risk factors for a substance use disorder.   Flowsheet Row Office Visit from 04/04/2022 in [facility] [place]  PHQ-9 Total Score 4          12/17/2021    5:00 AM 12/17/2021    8:00 AM 12/17/2021    8:00 PM 12/18/2021   10:00 AM 04/04/2022    8:35 AM  Fall Risk  Falls in the past year?     0  Was there an injury with Fall?     0  Fall Risk Category Calculator     0  Fall Risk Category     Low  Patient Fall Risk Level Moderate fall risk Moderate fall risk Moderate fall risk Moderate fall risk Low fall risk  Patient at Risk for Falls Due to     No Fall Risks  Fall risk Follow up     Falls evaluation completed     Impression and Plan:  Encounter for preventive health examination  Gastroesophageal reflux disease without esophagitis - Plan: pantoprazole (PROTONIX) 40 MG tablet  CHB (complete heart block) (HCC)  MCTD (mixed connective tissue disease) (HCC)  Vitamin D deficiency  Atrial fibrillation with RVR (HCC)  Essential hypertension - Plan: Hemoglobin A1c, Lipid panel, TSH, Vitamin B12, VITAMIN D 25 Hydroxy (Vit-D Deficiency, Fractures), VITAMIN D 25 Hydroxy (Vit-D Deficiency, Fractures), Vitamin B12, TSH, Lipid panel, Hemoglobin A1c  ILD (interstitial lung disease) (HCC)  Pulmonary hypertension, unspecified (HCC) - Plan: CBC with Differential/Platelet, Comprehensive metabolic panel, Comprehensive metabolic panel, CBC with Differential/Platelet  Dysuria - Plan: POCT urinalysis dipstick, Urinalysis, Culture, Urine, Urinalysis, nitrofurantoin, macrocrystal-monohydrate, (MACROBID) 100 MG capsule   -Recommend routine eye and dental  care. -Immunizations: Advise she update flu, COVID, shingles, Tdap, consider RSV vaccine -Healthy lifestyle discussed in detail. -Labs to be updated today. -Colon cancer screening: Deferred -Breast cancer screening: Deferred -Cervical cancer screening: Deferred -Lung cancer screening: Not applicable -Prostate cancer screening: Not applicable -DEXA: 04/2021  -In office urine dipstick shows leukocytes.  Sent for culture and treat with Macrobid.    Patient Instructions  -Nice seeing you today!!  -Lab work today; will notify you once results are available.  -Consider flu, tdap, shingles and RSV vaccines at your pharmacy.  -Schedule follow up in 6 months or sooner as needed.      05/2021, MD [facility]

## 2022-04-05 LAB — URINE CULTURE
MICRO NUMBER:: 13840009
Result:: NO GROWTH
SPECIMEN QUALITY:: ADEQUATE

## 2022-04-06 ENCOUNTER — Other Ambulatory Visit: Payer: Self-pay | Admitting: *Deleted

## 2022-04-06 DIAGNOSIS — E785 Hyperlipidemia, unspecified: Secondary | ICD-10-CM

## 2022-04-07 ENCOUNTER — Encounter: Payer: Self-pay | Admitting: Internal Medicine

## 2022-04-07 DIAGNOSIS — E785 Hyperlipidemia, unspecified: Secondary | ICD-10-CM

## 2022-04-08 ENCOUNTER — Telehealth: Payer: Self-pay | Admitting: Internal Medicine

## 2022-04-08 ENCOUNTER — Telehealth (HOSPITAL_COMMUNITY): Payer: Self-pay | Admitting: Internal Medicine

## 2022-04-08 NOTE — Telephone Encounter (Addendum)
Hi, Dr. Ardyth Harps,  Pt' daughter, [name]...  Called to say Pt was prescribed nitrofurantoin, macrocrystal-monohydrate, (MACROBID) 100 MG capsule after her CPE,  and she has been complaining of chest pains and BP has been running high... and was looking for advise.  I transferred her to the Triage Nurse for advise.  I just wanted to keep you in the loop.  Respectfully, IC

## 2022-04-08 NOTE — Telephone Encounter (Signed)
Spoke with daughter and patient's blood pressure 04/07/22 was 147/94.  Today her blood pressure is 125/84.  Daughter has put in a call to cardiology.  Daughter is aware of Dr Hardie Shackleton recommendation.

## 2022-04-08 NOTE — Telephone Encounter (Signed)
Pt c/o chest pains, bp was elevated, recently had pacemaker put in, pt can be reaced @[phone] 

## 2022-04-08 NOTE — Telephone Encounter (Signed)
Pts 142/94 yesterday today bp 120's/70's PCP advised pt to go to the emergency room. Pts daughter said pt is not having active chest pain but since her pacemaker she has felt "pressure". Pt recently had appointment with [facility] and was told she was stable. Pt given macrobid by pcp and thought bp was elevated by the antibiotic. Pcp told pt that was not a side effect and to go to the ED. Daughter states pt feels better but they will go to the emergency room if she experiences chest pain. Pt has appt scheduled 9/12 with Dr.Bensimhon.

## 2022-04-14 DIAGNOSIS — H35033 Hypertensive retinopathy, bilateral: Secondary | ICD-10-CM | POA: Diagnosis not present

## 2022-04-14 DIAGNOSIS — H04129 Dry eye syndrome of unspecified lacrimal gland: Secondary | ICD-10-CM | POA: Diagnosis not present

## 2022-04-14 DIAGNOSIS — Z79899 Other long term (current) drug therapy: Secondary | ICD-10-CM | POA: Diagnosis not present

## 2022-04-14 DIAGNOSIS — Z961 Presence of intraocular lens: Secondary | ICD-10-CM | POA: Diagnosis not present

## 2022-04-19 ENCOUNTER — Encounter (HOSPITAL_COMMUNITY): Payer: Self-pay | Admitting: Internal Medicine

## 2022-04-19 ENCOUNTER — Encounter: Payer: Self-pay | Admitting: Internal Medicine

## 2022-04-19 ENCOUNTER — Ambulatory Visit (HOSPITAL_COMMUNITY)
Admission: RE | Admit: 2022-04-19 | Discharge: 2022-04-19 | Disposition: A | Discharge status: Home / Self Care | Payer: Medicare PPO | Source: Ambulatory Visit | Attending: Internal Medicine | Admitting: Internal Medicine

## 2022-04-19 VITALS — BP 140/80 | HR 84 | Wt 155.2 lb

## 2022-04-19 DIAGNOSIS — J8410 Pulmonary fibrosis, unspecified: Secondary | ICD-10-CM | POA: Diagnosis not present

## 2022-04-19 DIAGNOSIS — R001 Bradycardia, unspecified: Secondary | ICD-10-CM | POA: Diagnosis not present

## 2022-04-19 DIAGNOSIS — M351 Other overlap syndromes: Secondary | ICD-10-CM

## 2022-04-19 DIAGNOSIS — J9611 Chronic respiratory failure with hypoxia: Secondary | ICD-10-CM | POA: Insufficient documentation

## 2022-04-19 DIAGNOSIS — I110 Hypertensive heart disease with heart failure: Secondary | ICD-10-CM | POA: Diagnosis not present

## 2022-04-19 DIAGNOSIS — I5032 Chronic diastolic (congestive) heart failure: Secondary | ICD-10-CM

## 2022-04-19 DIAGNOSIS — Z45010 Encounter for checking and testing of cardiac pacemaker pulse generator [battery]: Secondary | ICD-10-CM | POA: Insufficient documentation

## 2022-04-19 DIAGNOSIS — I480 Paroxysmal atrial fibrillation: Secondary | ICD-10-CM | POA: Diagnosis not present

## 2022-04-19 DIAGNOSIS — I471 Supraventricular tachycardia: Secondary | ICD-10-CM | POA: Insufficient documentation

## 2022-04-19 DIAGNOSIS — F32A Depression, unspecified: Secondary | ICD-10-CM | POA: Insufficient documentation

## 2022-04-19 DIAGNOSIS — I272 Pulmonary hypertension, unspecified: Secondary | ICD-10-CM | POA: Diagnosis not present

## 2022-04-19 DIAGNOSIS — Z79624 Long term (current) use of inhibitors of nucleotide synthesis: Secondary | ICD-10-CM | POA: Diagnosis not present

## 2022-04-19 DIAGNOSIS — I4510 Unspecified right bundle-branch block: Secondary | ICD-10-CM

## 2022-04-19 DIAGNOSIS — Z7901 Long term (current) use of anticoagulants: Secondary | ICD-10-CM | POA: Insufficient documentation

## 2022-04-19 DIAGNOSIS — F419 Anxiety disorder, unspecified: Secondary | ICD-10-CM | POA: Diagnosis not present

## 2022-04-19 DIAGNOSIS — J849 Interstitial pulmonary disease, unspecified: Secondary | ICD-10-CM | POA: Diagnosis not present

## 2022-04-19 NOTE — Progress Notes (Signed)
ADVANCED HF CLINIC NOTE  Date:  04/19/2022   ID:  Sabrina Douglas, Sabrina Douglas [birth year], MRN [id number]  Location: Home  Provider location: [facility] Type of Visit: Established patient  PCP:  Sabrina Douglas, Sabrina Halsted, MD  Cardiologist:  None Primary HF: Sabrina Douglas  Chief Complaint: Heart Failure follow-up   History of Present Illness:  Sabrina Douglas is a 81 y.o. female with HTN, HL, anxiety/depression, and ILD due MCTD.    Patient seen for initial consult with Sabrina Douglas on 08/28/18. CXR showed mildly worsened bibasilar pneumonia, suggestive of atypical infection d/t waxing/weaning symptoms. 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  ANA positive, CCP negative. 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    Admitted 2/20 with severe weakness. Fount to have hyponatremia at 114, dysphagia and about 20 pounds weight loss in the months. Treated with 3% saline and eventually demeclocycline.   Pt admitted 3/20 with new onset Afib. Converted back to NSR with amiodarone drip and was discontinued. Toprol resumed. Eliquis started. CT chest in 3/20 no coronary calcification. [place] 3/20 no PAH.   Follows with Sabrina Douglas & Sabrina Douglas. 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 Was on plaquenil/prednisone. Prednisone stopped in 10/21. Switched to Cellcept. Hires CT improving.    C/o chest tightness. Coronary CT ordered but never completed because she did not want to take b-blocker..   12/2020 Dr Sabrina Douglas started on mycophenalate. Volume status doing well.    4/23 Hires CT - no change   Echo 5/23 EF 60-65% RV normal   Underwent placement of Abbott dual chamber PM for bradycardia in 5/23.   Here for FU. Says she feels ok. Still with some dizziness despite pacemaker. Dizziness improved with treatment UTI. Remains on Cellcept per Dr. Amil Douglas. SBP 110-120. + DOE no change. LE edema improved. Still with  occasional atypical CP.    Studies:  Zio 10/12/20 1. Sinus rhythm -  avg HR of 78 bpm.  2. Bundle Branch Block/IVCD was present.  3. 16 runs of SVT occurred, the run with the fastest interval lasting 7 beats with a max rate of 190 bpm, the longest lasting 8 beats with an avg rate of 114 bpm. 4. 708 episode(s) of brief nocturnal AV Block (2nd Mobitz II) occurred,  5. Rare PACs and PVCs 6. Nearly all patient-triggered events associated with sinus rhythm   HiRes CT 10/20 1. Bilateral lower lobe predominant bronchiectasis and volume loss may be postinfectious or post inflammatory in etiology. Findings are suggestive of an alternative diagnosis (not UIP) per consensus guidelines: Diagnosis of Idiopathic Pulmonary Fibrosis  Zio Patch 11/28/18  1. Predominant rhythm was sinus. Patient had a min HR of 54 bpm, max HR of 214 bpm, and avg HR of 96 bpm. 2. Two brief runs of NSVT - longest was 8 beats 3. Forty five brief runs of SVT. The run with the fastest interval lasting 5 beats with a max rate of 214 bpm, the longest lasting 15.7 secs with an avg rate of 154 bpm. 4. Rare PVCs    RHC 10/15/18 RA = 3 RV = 39/4 PA = 39/7 (18) PCW = 4 Fick cardiac output/index = 6.5/3.8 PVR = 2.1 WU Ao sat = 100% PA sat = 81%, 82% High SVC and axillary sats both 81%   Assessment:  1. Normal pulmonary pressures 2. High output physiology without evidence of intracardiac  shunting    Labs  ANA 1:1280 (pos) ESR 104  RF 55 (pos) RNP > 8.0  (pos) AntiJo negative CCP < 16 Anti-Smith 2.0 (pos) Scl-70 < 1.0 ANCA negative   HRCT 09/10/18 1. 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 Repeat high-resolution chest CT is recommended in 12 months to assess for temporal changes in the appearance of the lung parenchyma. 2. Aortic atherosclerosis.   Echo 09/30/18  1. The left ventricle has hyperdynamic  systolic function, with an ejection fraction of >65%. The cavity size was normal. Left ventricular diastolic parameters were normal No evidence of left ventricular regional wall motion abnormalities.  2. No evidence of left ventricular regional wall motion abnormalities.  3. The right ventricle has normal systolic function. The cavity was grossly normal. RA-RV peak gradient increased at 66.9 mmHg consistent with probable severe pulmonary hypertension. Could not estimate CVP however.  4. The aortic valve is tricuspid Mild aortic annular calcification noted.  5. The mitral valve is normal in structure.  6. The tricuspid valve is normal in structure. Tricuspid valve regurgitation is    PFTs  (10/02/18) FEV1 1.13 (66%) FVC   1.23 (56%) DLCO 16%   VQ scan 10/03/18: Negative   Past Medical History:  Diagnosis Date   Anxiety    CHF (congestive heart failure) ([place])    Depression    Hyperlipidemia    Past Surgical History:  Procedure Laterality Date   BALLOON DILATION N/A 10/04/2018   Procedure: BALLOON DILATION;  Surgeon: Otis Brace, MD;  Location: [facility];  Service: Gastroenterology;  Laterality: N/A;   BIOPSY  10/04/2018   Procedure: BIOPSY;  Surgeon: Otis Brace, MD;  Location: [facility];  Service: Gastroenterology;;   CATARACT EXTRACTION     ESOPHAGOGASTRODUODENOSCOPY (EGD) WITH PROPOFOL N/A 10/04/2018   Procedure: ESOPHAGOGASTRODUODENOSCOPY (EGD) WITH PROPOFOL;  Surgeon: Otis Brace, MD;  Location: [facility];  Service: Gastroenterology;  Laterality: N/A;   PACEMAKER IMPLANT N/A 12/17/2021   Procedure: PACEMAKER IMPLANT;  Surgeon: Constance Haw, MD;  Location: [facility];  Service: Cardiovascular;  Laterality: N/A;   RIGHT HEART CATH N/A 10/15/2018   Procedure: RIGHT HEART CATH;  Surgeon: Jolaine Artist, MD;  Location: [facility];  Service: Cardiovascular;  Laterality: N/A;     Current Outpatient Medications  Medication Sig  Dispense Refill   acetaminophen (TYLENOL) 325 MG tablet Take 325 mg by mouth every 6 (six) hours as needed for moderate pain or headache.     cholecalciferol (VITAMIN D3) 25 MCG (1000 UNIT) tablet Take 1 tablet (1,000 Units total) by mouth daily.     ELIQUIS 5 MG TABS tablet TAKE 1 TABLET(5 MG) BY MOUTH TWICE DAILY 60 tablet 11   fexofenadine (ALLEGRA) 180 MG tablet Take 180 mg by mouth daily as needed for allergies or rhinitis.     furosemide (LASIX) 40 MG tablet Take 1 tablet (40 mg total) by mouth daily. 90 tablet 1   Multiple Vitamin (MULTIVITAMIN WITH MINERALS) TABS tablet Take 1 tablet by mouth daily.     mycophenolate (CELLCEPT) 500 MG tablet Take 500 mg by mouth 2 (two) times daily.     OXYGEN Inhale 1.5 L into the lungs See admin instructions. At bedtime  During the day as needed for shortness of breath     pantoprazole (PROTONIX) 40 MG tablet Take 1 tablet (40 mg total) by mouth daily. 90 tablet 1  RESTASIS 0.05 % ophthalmic emulsion Place 1 drop into both eyes 2 (two) times daily. 8:30 am and 6:30 pm     SYSTANE ULTRA 0.4-0.3 % SOLN Apply 1 drop to eye 2 (two) times daily. 12:30 pm and 8 pm     No current facility-administered medications for this encounter.    Allergies:   Pedi-pre tape spray [wound dressing adhesive]   Social History:  The patient  reports that she has never smoked. She has been exposed to tobacco smoke. She has never used smokeless tobacco. She reports that she does not drink alcohol.   Family History:  The patient's family history includes Diabetes in her father; Prostate cancer in her father; Sudden death (age of onset: 64) in her mother.   ROS:  Please see the history of present illness.   All other systems are personally reviewed and negative.   Vitals:   04/19/22 1100  BP: (!) 140/80  Pulse: 84  SpO2: 100%  Weight: 70.4 kg (155 lb 3.2 oz)   Exam:   General:  Elderly. No resp difficulty HEENT: normal Neck: supple. no JVD. Carotids 2+ bilat; no  bruits. No lymphadenopathy or thryomegaly appreciated. Cor: PMI nondisplaced. Regular rate & rhythm. No rubs, gallops or murmurs. Pacer site ok Lungs: decreased throughout Abdomen: soft, nontender, nondistended. No hepatosplenomegaly. No bruits or masses. Good bowel sounds. Extremities: no cyanosis, clubbing, rash, edema Neuro: alert & orientedx3, cranial nerves grossly intact. moves all 4 extremities w/o difficulty. Affect pleasant  Recent Labs: 12/16/2021: B Natriuretic Peptide 162.4 12/18/2021: Magnesium 2.1 04/04/2022: ALT 7; BUN 14; Creatinine, Ser 0.81; Hemoglobin 13.0; Platelets 259.0; Potassium 3.9; Sodium 138; TSH 2.61  Personally reviewed   Wt Readings from Last 3 Encounters:  04/19/22 70.4 kg (155 lb 3.2 oz)  04/04/22 70.4 kg (155 lb 3.2 oz)  03/21/22 70.8 kg (156 lb)     ASSESSMENT AND PLAN:  1.PVCs - zio 3/22 rare PVCs  2. Symptomatic bradycardia with high-grade HB  - s/p Abbott dual chamber PM 5/23 - PPM interrogated personally in clinic > 94% AS-VP. Tolerating well symptomatically - Will recheck echo to make sure EF stable - Have reached out to EP to review.  - If becomes symptomatic or EF drops with RV pacing consider upgrade to CRT  3. RBBB - stable  4. 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 - VQ is negative. - RHC 10/15/18 with normal pulmonary pressures and high output physiology without evidence of intracardiac shunting so not candidate for selective pulmonary vasodilators  - Now on CellCept per Dr. Amil Douglas. - Repeat echo   6. Chronic hypoxic respiratory failure - likely due to ILD & less PAH - Much improved with treatment of MCTD. Only wears O2 at night now - Followed closely by Dr Elsworth Douglas. Last hi-res CT 4/22 slightly worse ILD  - continue current regimen - stable   7. Afib, paroxysmal/PSVT - In NSR  today. No AF last visit or on Zio 3/22 - This patients CHA2DS2-VASc is 4.  - Continue Eliquis 5 bid. No bleeding - < 1% AF burden on PM today   Glori Bickers, MD  04/19/2022 11:17 AM  [facility] [place] [address] [phone] (office) [phone] (fax)  11:17 AM

## 2022-04-19 NOTE — Patient Instructions (Signed)
Your physician has requested that you have an echocardiogram. Echocardiography is a painless test that uses sound waves to create images of your heart. It provides your doctor with information about the size and shape of your heart and how well your heart's chambers and valves are working. This procedure takes approximately one hour. There are no restrictions for this procedure.  Your physician recommends that you schedule a follow-up appointment in: 6 months (March 2024), **PLEASE CALL OUR OFFICE IN [place] TO SCHEDULE THIS APPOINTMENT  If you have any questions or concerns before your next appointment please send [place] a message through [place] or call our office at [phone].    TO LEAVE A MESSAGE FOR THE NURSE SELECT OPTION 2, PLEASE LEAVE A MESSAGE INCLUDING: YOUR NAME DATE OF BIRTH CALL BACK NUMBER REASON FOR CALL**this is important as we prioritize the call backs  YOU WILL RECEIVE A CALL BACK THE SAME DAY AS LONG AS YOU CALL BEFORE 4:00 PM  At the [facility], you and your health needs are our priority. As part of our continuing mission to provide you with exceptional heart care, we have created designated Provider Care Teams. These Care Teams include your primary Cardiologist (physician) and Advanced Practice Providers (APPs- Physician Assistants and Nurse Practitioners) who all work together to provide you with the care you need, when you need it.   You may see any of the following providers on your designated Care Team at your next follow up: Dr Arvilla Meres Dr Marca Ancona Dr. Marcos Eke, NP Robbie Lis, [place] [facility], [place] Brynda Peon, NP Karle Plumber, PharmD   Please be sure to bring in all your medications bottles to every appointment.

## 2022-05-19 DIAGNOSIS — J849 Interstitial pulmonary disease, unspecified: Secondary | ICD-10-CM | POA: Diagnosis not present

## 2022-05-19 DIAGNOSIS — I272 Pulmonary hypertension, unspecified: Secondary | ICD-10-CM | POA: Diagnosis not present

## 2022-05-20 ENCOUNTER — Ambulatory Visit (HOSPITAL_COMMUNITY)
Admission: RE | Admit: 2022-05-20 | Discharge: 2022-05-20 | Disposition: A | Discharge status: Home / Self Care | Payer: Medicare PPO | Source: Ambulatory Visit | Attending: Internal Medicine | Admitting: Internal Medicine

## 2022-05-20 DIAGNOSIS — I509 Heart failure, unspecified: Secondary | ICD-10-CM | POA: Diagnosis not present

## 2022-05-20 DIAGNOSIS — I272 Pulmonary hypertension, unspecified: Secondary | ICD-10-CM | POA: Insufficient documentation

## 2022-05-20 DIAGNOSIS — I081 Rheumatic disorders of both mitral and tricuspid valves: Secondary | ICD-10-CM | POA: Diagnosis not present

## 2022-05-20 DIAGNOSIS — I361 Nonrheumatic tricuspid (valve) insufficiency: Secondary | ICD-10-CM

## 2022-05-20 DIAGNOSIS — E785 Hyperlipidemia, unspecified: Secondary | ICD-10-CM | POA: Insufficient documentation

## 2022-05-20 LAB — ECHOCARDIOGRAM COMPLETE
Area-P 1/2: 3.01 cm2
MV M vel: 3.44 m/s
MV Peak grad: 47.3 mmHg
S' Lateral: 2.4 cm

## 2022-05-20 NOTE — Progress Notes (Signed)
  Echocardiogram 2D Echocardiogram has been performed.  Darlina Sicilian M 05/20/2022, 11:27 AM

## 2022-06-19 DIAGNOSIS — J849 Interstitial pulmonary disease, unspecified: Secondary | ICD-10-CM | POA: Diagnosis not present

## 2022-06-19 DIAGNOSIS — I272 Pulmonary hypertension, unspecified: Secondary | ICD-10-CM | POA: Diagnosis not present

## 2022-06-20 ENCOUNTER — Ambulatory Visit (INDEPENDENT_AMBULATORY_CARE_PROVIDER_SITE_OTHER): Payer: Medicare PPO

## 2022-06-20 DIAGNOSIS — I442 Atrioventricular block, complete: Secondary | ICD-10-CM

## 2022-06-21 LAB — CUP PACEART REMOTE DEVICE CHECK
Battery Remaining Longevity: 60 mo
Battery Remaining Percentage: 94 %
Battery Voltage: 2.98 V
Brady Statistic AP VP Percent: 8.3 %
Brady Statistic AP VS Percent: 1 %
Brady Statistic AS VP Percent: 92 %
Brady Statistic AS VS Percent: 1 %
Brady Statistic RA Percent Paced: 8.2 %
Brady Statistic RV Percent Paced: 99 %
Date Time Interrogation Session: 20231113020024
Implantable Lead Connection Status: 753985
Implantable Lead Connection Status: 753985
Implantable Lead Implant Date: 20230512
Implantable Lead Implant Date: 20230512
Implantable Lead Location: 753859
Implantable Lead Location: 753860
Implantable Pulse Generator Implant Date: 20230512
Lead Channel Impedance Value: 390 Ohm
Lead Channel Impedance Value: 400 Ohm
Lead Channel Pacing Threshold Amplitude: 0.75 V
Lead Channel Pacing Threshold Amplitude: 0.75 V
Lead Channel Pacing Threshold Pulse Width: 0.4 ms
Lead Channel Pacing Threshold Pulse Width: 0.4 ms
Lead Channel Sensing Intrinsic Amplitude: 12 mV
Lead Channel Sensing Intrinsic Amplitude: 2.1 mV
Lead Channel Setting Pacing Amplitude: 3.5 V
Lead Channel Setting Pacing Amplitude: 3.5 V
Lead Channel Setting Pacing Pulse Width: 0.4 ms
Lead Channel Setting Sensing Sensitivity: 2 mV
Pulse Gen Model: 2272
Pulse Gen Serial Number: 8063350

## 2022-07-19 DIAGNOSIS — J849 Interstitial pulmonary disease, unspecified: Secondary | ICD-10-CM | POA: Diagnosis not present

## 2022-07-19 DIAGNOSIS — I272 Pulmonary hypertension, unspecified: Secondary | ICD-10-CM | POA: Diagnosis not present

## 2022-07-27 DIAGNOSIS — I4891 Unspecified atrial fibrillation: Secondary | ICD-10-CM | POA: Diagnosis not present

## 2022-07-27 DIAGNOSIS — E663 Overweight: Secondary | ICD-10-CM | POA: Diagnosis not present

## 2022-07-27 DIAGNOSIS — I272 Pulmonary hypertension, unspecified: Secondary | ICD-10-CM | POA: Diagnosis not present

## 2022-07-27 DIAGNOSIS — I7300 Raynaud's syndrome without gangrene: Secondary | ICD-10-CM | POA: Diagnosis not present

## 2022-07-27 DIAGNOSIS — J849 Interstitial pulmonary disease, unspecified: Secondary | ICD-10-CM | POA: Diagnosis not present

## 2022-07-27 DIAGNOSIS — Z6826 Body mass index (BMI) 26.0-26.9, adult: Secondary | ICD-10-CM | POA: Diagnosis not present

## 2022-07-27 DIAGNOSIS — M351 Other overlap syndromes: Secondary | ICD-10-CM | POA: Diagnosis not present

## 2022-08-02 NOTE — Progress Notes (Signed)
Remote pacemaker transmission.   

## 2022-08-05 ENCOUNTER — Encounter: Payer: Self-pay | Admitting: Internal Medicine

## 2022-08-17 ENCOUNTER — Ambulatory Visit (INDEPENDENT_AMBULATORY_CARE_PROVIDER_SITE_OTHER): Payer: Medicare PPO | Admitting: Pulmonary Disease

## 2022-08-17 ENCOUNTER — Encounter: Payer: Self-pay | Admitting: Internal Medicine

## 2022-08-17 ENCOUNTER — Encounter (HOSPITAL_BASED_OUTPATIENT_CLINIC_OR_DEPARTMENT_OTHER): Payer: Self-pay | Admitting: Pulmonary Disease

## 2022-08-17 ENCOUNTER — Other Ambulatory Visit: Payer: Self-pay | Admitting: Internal Medicine

## 2022-08-17 VITALS — BP 110/80 | HR 85 | Temp 98.3°F | Ht 64.0 in | Wt 154.6 lb

## 2022-08-17 DIAGNOSIS — B372 Candidiasis of skin and nail: Secondary | ICD-10-CM

## 2022-08-17 DIAGNOSIS — J849 Interstitial pulmonary disease, unspecified: Secondary | ICD-10-CM | POA: Diagnosis not present

## 2022-08-17 MED ORDER — NYSTATIN 100000 UNIT/GM EX POWD: 1.0000 | Freq: Three times a day (TID) | CUTANEOUS | 3 refills | Status: DC

## 2022-08-17 NOTE — Assessment & Plan Note (Signed)
Appears stable by history. Will obtain high-resolution CT chest to follow-up in April 2024. She is tolerating CellCept well and will continue. Lab work has been checked by rheumatology. I discussed COVID vaccination since she is immunocompromise.  She has received flu and RSV vaccination

## 2022-08-17 NOTE — Patient Instructions (Signed)
HRCT chest in April 2024

## 2022-08-17 NOTE — Progress Notes (Signed)
   Subjective:    Patient ID: Sabrina Douglas, female    DOB: May 06, 1941, 82 y.o.   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    Sees Dr. [name] -initially started on Plaquenil and 10 mg of prednisone, gradually tapered to off 02/2020 05/2020 started on CellCept      PMH - 10/2020 -new onset RBBB noted on EKG -felt to be conduction disease,  s/p PPM 12/2021 ,on Eliquis for paroxysmal A. Fib Seen by ENT for dysphagia  Chief Complaint  Patient presents with   Follow-up    Pt states she is staying dizzy and blood pressure staying low.    59-month follow-up visit.  Arrives accompanied by her daughter. She has seen Dr. [name] and cardiology and reviewed their consultation.  She complains of pins-and-needles in her hands and under her breasts.  She has skin changes under her breast which her PCP thought was a fungal infection and gave her some dry powder for this. She complains of occasional shortness of breath and uses oxygen intermittently. She is continues to have dizzy spells in spite of having a pacemaker placed.  Echocardiogram 05/2022 showed normal LVEF normal RV SP She has received flu and RSV vaccination   Significant tests/ events reviewed  PFTs 04/2020 -mild restriction, ratio 85, FEV1 92%, FVC 84%, TLC 65%, DLCO 62% >> much improved compared to prior     ANA positive 1: 1280, Sm Ab 2.0, RNP > 8.0 ,CCP negative    10/02/2018 PFTs ratio 92, moderate to severe restriction FEV1 66%, FVC 56%, TLC 46%, DLCO 17% corrects to 51% for alveolar volume   Echo 05/2021 normal RVSP Echo 09/2018 RVSP 67 Cath 10/2018 RA 3, PA 39/7 , CI 3.8 PVR2.1 Wu >> normal pulmonary pressures, high-output physiology     HRCT chest 11/2021 unchanged fibrosis "alternate" pattern HRCT 11/2020 Basilar predominant bronchiectasis, ground-glass and volume  loss,progressive from 05/22/2019 " alternative " (not UIP ) ? Fibrotic NSIP   HRCT 05/2019 Bilateral lower lobe predominant bronchiectasis and volume loss may be postinfectious or post inflammatory in etiology HRCT 09/10/2018 -probable UIP 09/2018 VQ scan very low probability Esophagram 09/2018 smooth narrowing of distal esophagus?  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 Also denies presyncope, palpitations, heartburn, abdominal pain, nausea, vomiting, diarrhea or change in bowel or urinary habits, dysuria,hematuria, rash, arthralgias, visual complaints, headache, numbness weakness or ataxia.     Objective:   Physical Exam  Gen. Pleasant, obese, in no distress ENT - no lesions, no post nasal drip Neck: No JVD, no thyromegaly, no carotid bruits Lungs: no use of accessory muscles, no dullness to percussion, rt basal rales no rhonchi  Cardiovascular: Rhythm regular, heart sounds  normal, no murmurs or gallops, no peripheral edema Musculoskeletal: No deformities, no cyanosis or clubbing , no tremors       Assessment & Plan:

## 2022-09-18 ENCOUNTER — Other Ambulatory Visit: Payer: Self-pay | Admitting: Internal Medicine

## 2022-09-18 DIAGNOSIS — I272 Pulmonary hypertension, unspecified: Secondary | ICD-10-CM

## 2022-09-19 ENCOUNTER — Ambulatory Visit: Payer: Medicare PPO

## 2022-09-19 DIAGNOSIS — R001 Bradycardia, unspecified: Secondary | ICD-10-CM

## 2022-09-19 DIAGNOSIS — I442 Atrioventricular block, complete: Secondary | ICD-10-CM

## 2022-09-20 LAB — CUP PACEART REMOTE DEVICE CHECK
Battery Remaining Longevity: 58 mo
Battery Remaining Percentage: 90 %
Battery Voltage: 2.98 V
Brady Statistic AP VP Percent: 11 %
Brady Statistic AP VS Percent: 1 %
Brady Statistic AS VP Percent: 89 %
Brady Statistic AS VS Percent: 1 %
Brady Statistic RA Percent Paced: 11 %
Brady Statistic RV Percent Paced: 99 %
Date Time Interrogation Session: 20240212035158
Implantable Lead Connection Status: 753985
Implantable Lead Connection Status: 753985
Implantable Lead Implant Date: 20230512
Implantable Lead Implant Date: 20230512
Implantable Lead Location: 753859
Implantable Lead Location: 753860
Implantable Pulse Generator Implant Date: 20230512
Lead Channel Impedance Value: 410 Ohm
Lead Channel Impedance Value: 440 Ohm
Lead Channel Pacing Threshold Amplitude: 0.75 V
Lead Channel Pacing Threshold Amplitude: 0.75 V
Lead Channel Pacing Threshold Pulse Width: 0.4 ms
Lead Channel Pacing Threshold Pulse Width: 0.4 ms
Lead Channel Sensing Intrinsic Amplitude: 12 mV
Lead Channel Sensing Intrinsic Amplitude: 2 mV
Lead Channel Setting Pacing Amplitude: 3.5 V
Lead Channel Setting Pacing Amplitude: 3.5 V
Lead Channel Setting Pacing Pulse Width: 0.4 ms
Lead Channel Setting Sensing Sensitivity: 2 mV
Pulse Gen Model: 2272
Pulse Gen Serial Number: 8063350

## 2022-09-22 ENCOUNTER — Other Ambulatory Visit: Payer: Self-pay | Admitting: Internal Medicine

## 2022-09-22 DIAGNOSIS — I272 Pulmonary hypertension, unspecified: Secondary | ICD-10-CM

## 2022-10-27 ENCOUNTER — Other Ambulatory Visit: Payer: Self-pay | Admitting: Internal Medicine

## 2022-10-27 DIAGNOSIS — E785 Hyperlipidemia, unspecified: Secondary | ICD-10-CM

## 2022-10-27 DIAGNOSIS — I493 Ventricular premature depolarization: Secondary | ICD-10-CM

## 2022-10-27 DIAGNOSIS — M8580 Other specified disorders of bone density and structure, unspecified site: Secondary | ICD-10-CM

## 2022-10-27 DIAGNOSIS — I272 Pulmonary hypertension, unspecified: Secondary | ICD-10-CM

## 2022-10-27 DIAGNOSIS — I10 Essential (primary) hypertension: Secondary | ICD-10-CM

## 2022-10-27 DIAGNOSIS — I4891 Unspecified atrial fibrillation: Secondary | ICD-10-CM

## 2022-10-27 DIAGNOSIS — I442 Atrioventricular block, complete: Secondary | ICD-10-CM

## 2022-11-02 NOTE — Progress Notes (Signed)
Remote pacemaker transmission.   

## 2022-11-03 ENCOUNTER — Encounter (HOSPITAL_COMMUNITY): Payer: Self-pay | Admitting: Internal Medicine

## 2022-11-03 ENCOUNTER — Ambulatory Visit (HOSPITAL_COMMUNITY)
Admission: RE | Admit: 2022-11-03 | Discharge: 2022-11-03 | Disposition: A | Discharge status: Home / Self Care | Payer: Medicare PPO | Source: Ambulatory Visit | Attending: Internal Medicine | Admitting: Internal Medicine

## 2022-11-03 VITALS — BP 154/90 | HR 96 | Wt 161.0 lb

## 2022-11-03 DIAGNOSIS — Z7901 Long term (current) use of anticoagulants: Secondary | ICD-10-CM | POA: Insufficient documentation

## 2022-11-03 DIAGNOSIS — I480 Paroxysmal atrial fibrillation: Secondary | ICD-10-CM | POA: Diagnosis not present

## 2022-11-03 DIAGNOSIS — R079 Chest pain, unspecified: Secondary | ICD-10-CM

## 2022-11-03 DIAGNOSIS — F32A Depression, unspecified: Secondary | ICD-10-CM | POA: Insufficient documentation

## 2022-11-03 DIAGNOSIS — J8410 Pulmonary fibrosis, unspecified: Secondary | ICD-10-CM | POA: Insufficient documentation

## 2022-11-03 DIAGNOSIS — J9611 Chronic respiratory failure with hypoxia: Secondary | ICD-10-CM | POA: Insufficient documentation

## 2022-11-03 DIAGNOSIS — M351 Other overlap syndromes: Secondary | ICD-10-CM | POA: Diagnosis not present

## 2022-11-03 DIAGNOSIS — I110 Hypertensive heart disease with heart failure: Secondary | ICD-10-CM | POA: Diagnosis not present

## 2022-11-03 DIAGNOSIS — Z79624 Long term (current) use of inhibitors of nucleotide synthesis: Secondary | ICD-10-CM | POA: Insufficient documentation

## 2022-11-03 DIAGNOSIS — I5032 Chronic diastolic (congestive) heart failure: Secondary | ICD-10-CM

## 2022-11-03 DIAGNOSIS — F419 Anxiety disorder, unspecified: Secondary | ICD-10-CM | POA: Insufficient documentation

## 2022-11-03 DIAGNOSIS — I493 Ventricular premature depolarization: Secondary | ICD-10-CM | POA: Diagnosis not present

## 2022-11-03 DIAGNOSIS — I471 Supraventricular tachycardia, unspecified: Secondary | ICD-10-CM | POA: Insufficient documentation

## 2022-11-03 DIAGNOSIS — M329 Systemic lupus erythematosus, unspecified: Secondary | ICD-10-CM | POA: Insufficient documentation

## 2022-11-03 DIAGNOSIS — I442 Atrioventricular block, complete: Secondary | ICD-10-CM

## 2022-11-03 NOTE — Patient Instructions (Addendum)
Good to see you today!  Your physician has requested that you have an echocardiogram. Echocardiography is a painless test that uses sound waves to create images of your heart. It provides your doctor with information about the size and shape of your heart and how well your heart's chambers and valves are working. This procedure takes approximately one hour. There are no restrictions for this procedure. Please do NOT wear cologne, perfume, aftershave, or lotions (deodorant is allowed). Please arrive 15 minutes prior to your appointment   Your physician has requested that you have cardiac CT. Cardiac computed tomography (CT) is a painless test that uses an x-ray machine to take clear, detailed pictures of your heart. For further information please visit [email or web address]. Please follow instruction sheet as given. You will be called once insurance approves  Your physician recommends that you schedule a follow-up appointment in: 9 months(December) Call office in October to schedule an appointment       If you have any questions or concerns before your next appointment please send [place] a message through [place] or call our office at [phone].    TO LEAVE A MESSAGE FOR THE NURSE SELECT OPTION 2, PLEASE LEAVE A MESSAGE INCLUDING: YOUR NAME DATE OF BIRTH CALL BACK NUMBER REASON FOR CALL**this is important as we prioritize the call backs  YOU WILL RECEIVE A CALL BACK THE SAME DAY AS LONG AS YOU CALL BEFORE 4:00 PM  At the [facility], you and your health needs are our priority. As part of our continuing mission to provide you with exceptional heart care, we have created designated Provider Care Teams. These Care Teams include your primary Cardiologist (physician) and Advanced Practice Providers (APPs- Physician Assistants and Nurse Practitioners) who all work together to provide you with the care you need, when you need it.   You may see any of the following providers on your  designated Care Team at your next follow up: Dr Glori Bickers Dr Loralie Champagne Dr. Roxana Hires, NP Lyda Jester, [place] [facility] [place], [place] Forestine Na, NP Audry Riles, PharmD   Please be sure to bring in all your medications bottles to every appointment.    Thank you for choosing [facility]

## 2022-11-03 NOTE — Progress Notes (Signed)
ADVANCED HF CLINIC NOTE  Date:  04/19/2022   ID:  Sabrina Douglas, Sabrina Douglas 05/08/1941, MRN [id number]  Location: Home  Provider location: [facility] Type of Visit: Established patient  PCP:  Sabrina Douglas, Sabrina Halsted, MD  Cardiologist:  None Primary HF: Sabrina Douglas  Chief Complaint: Heart Failure follow-up   History of Present Illness:  Sabrina Douglas is a 82 y.o. female with HTN, HL, anxiety/depression, and ILD due MCTD.    Patient seen for initial consult with Dr. Elsworth Douglas on 08/28/18. CXR showed mildly worsened bibasilar pneumonia, suggestive of atypical infection d/t waxing/weaning symptoms. 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  ANA positive, CCP negative. 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    Admitted 2/20 with severe weakness. Fount to have hyponatremia at 114, dysphagia and about 20 pounds weight loss in the months. Treated with 3% saline and eventually demeclocycline.   Pt admitted 3/20 with new onset Afib. Converted back to NSR with amiodarone drip and was discontinued. Toprol resumed. Eliquis started. CT chest in 3/20 no coronary calcification. [place] 3/20 no PAH.   Follows with Dr. Elsworth Douglas & Sabrina Douglas. 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 Was on plaquenil/prednisone. Prednisone stopped in 10/21. Switched to Cellcept. Hires CT improving.    C/o chest tightness. Coronary CT ordered but never completed because she did not want to take b-blocker..   12/2020 Dr Sabrina Douglas started on mycophenalate. Volume status doing well.    4/23 Hires CT - no change   Echo 5/23 EF 60-65% RV normal   Underwent placement of Abbott dual chamber PM for bradycardia in 5/23.   Here for f/u with her daughter. Able to do all ADLS without too much problem but chest gets tight and SOB  if she tries to do more. No edema,orthopnea or PND.   Studies:  Zio 10/12/20 1. Sinus rhythm -  avg HR of  78 bpm.  2. Bundle Branch Block/IVCD was present.  3. 16 runs of SVT occurred, the run with the fastest interval lasting 7 beats with a max rate of 190 bpm, the longest lasting 8 beats with an avg rate of 114 bpm. 4. 708 episode(s) of brief nocturnal AV Block (2nd Mobitz II) occurred,  5. Rare PACs and PVCs 6. Nearly all patient-triggered events associated with sinus rhythm   HiRes CT 10/20 1. Bilateral lower lobe predominant bronchiectasis and volume loss may be postinfectious or post inflammatory in etiology. Findings are suggestive of an alternative diagnosis (not UIP) per consensus guidelines: Diagnosis of Idiopathic Pulmonary Fibrosis  Zio Patch 11/28/18  1. Predominant rhythm was sinus. Patient had a min HR of 54 bpm, max HR of 214 bpm, and avg HR of 96 bpm. 2. Two brief runs of NSVT - longest was 8 beats 3. Forty five brief runs of SVT. The run with the fastest interval lasting 5 beats with a max rate of 214 bpm, the longest lasting 15.7 secs with an avg rate of 154 bpm. 4. Rare PVCs    RHC 10/15/18 RA = 3 RV = 39/4 PA = 39/7 (18) PCW = 4 Fick cardiac output/index = 6.5/3.8 PVR = 2.1 WU Ao sat = 100% PA sat = 81%, 82% High SVC and axillary sats both 81%   Assessment:  1. Normal pulmonary pressures 2. High output physiology without evidence of intracardiac shunting    Labs  ANA  1:1280 (pos) ESR 104  RF 55 (pos) RNP > 8.0  (pos) AntiJo negative CCP < 16 Anti-Smith 2.0 (pos) Scl-70 < 1.0 ANCA negative   HRCT 09/10/18 1. 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 Repeat high-resolution chest CT is recommended in 12 months to assess for temporal changes in the appearance of the lung parenchyma. 2. Aortic atherosclerosis.   Echo 09/30/18  1. The left ventricle has hyperdynamic systolic function, with an ejection fraction of >65%. The cavity size was normal.  Left ventricular diastolic parameters were normal No evidence of left ventricular regional wall motion abnormalities.  2. No evidence of left ventricular regional wall motion abnormalities.  3. The right ventricle has normal systolic function. The cavity was grossly normal. RA-RV peak gradient increased at 66.9 mmHg consistent with probable severe pulmonary hypertension. Could not estimate CVP however.  4. The aortic valve is tricuspid Mild aortic annular calcification noted.  5. The mitral valve is normal in structure.  6. The tricuspid valve is normal in structure. Tricuspid valve regurgitation is    PFTs  (10/02/18) FEV1 1.13 (66%) FVC   1.23 (56%) DLCO 16%   VQ scan 10/03/18: Negative   Past Medical History:  Diagnosis Date   Anxiety    CHF (congestive heart failure) ([place])    Depression    Hyperlipidemia    Past Surgical History:  Procedure Laterality Date   BALLOON DILATION N/A 10/04/2018   Procedure: BALLOON DILATION;  Surgeon: Otis Brace, MD;  Location: [facility];  Service: Gastroenterology;  Laterality: N/A;   BIOPSY  10/04/2018   Procedure: BIOPSY;  Surgeon: Otis Brace, MD;  Location: [facility];  Service: Gastroenterology;;   CATARACT EXTRACTION     ESOPHAGOGASTRODUODENOSCOPY (EGD) WITH PROPOFOL N/A 10/04/2018   Procedure: ESOPHAGOGASTRODUODENOSCOPY (EGD) WITH PROPOFOL;  Surgeon: Otis Brace, MD;  Location: [facility];  Service: Gastroenterology;  Laterality: N/A;   PACEMAKER IMPLANT N/A 12/17/2021   Procedure: PACEMAKER IMPLANT;  Surgeon: Constance Haw, MD;  Location: [facility];  Service: Cardiovascular;  Laterality: N/A;   RIGHT HEART CATH N/A 10/15/2018   Procedure: RIGHT HEART CATH;  Surgeon: Jolaine Artist, MD;  Location: [facility];  Service: Cardiovascular;  Laterality: N/A;     Current Outpatient Medications  Medication Sig Dispense Refill   acetaminophen (TYLENOL) 325 MG tablet Take 325 mg by mouth every 6  (six) hours as needed for moderate pain or headache.     cholecalciferol (VITAMIN D3) 25 MCG (1000 UNIT) tablet Take 1 tablet (1,000 Units total) by mouth daily.     ELIQUIS 5 MG TABS tablet TAKE 1 TABLET(5 MG) BY MOUTH TWICE DAILY 60 tablet 11   fexofenadine (ALLEGRA) 180 MG tablet Take 180 mg by mouth daily as needed for allergies or rhinitis.     furosemide (LASIX) 40 MG tablet Take 1 tablet (40 mg total) by mouth daily. 90 tablet 1   Multiple Vitamin (MULTIVITAMIN WITH MINERALS) TABS tablet Take 1 tablet by mouth daily.     mycophenolate (CELLCEPT) 500 MG tablet Take 500 mg by mouth 2 (two) times daily.     OXYGEN Inhale 1.5 L into the lungs See admin instructions. At bedtime  During the day as needed for shortness of breath     pantoprazole (PROTONIX) 40 MG tablet Take 1 tablet (40 mg total) by mouth daily. 90 tablet 1   RESTASIS 0.05 % ophthalmic emulsion Place  1 drop into both eyes 2 (two) times daily. 8:30 am and 6:30 pm     SYSTANE ULTRA 0.4-0.3 % SOLN Apply 1 drop to eye 2 (two) times daily. 12:30 pm and 8 pm     No current facility-administered medications for this encounter.    Allergies:   Pedi-pre tape spray [wound dressing adhesive]   Social History:  The patient  reports that she has never smoked. She has been exposed to tobacco smoke. She has never used smokeless tobacco. She reports that she does not drink alcohol.   Family History:  The patient's family history includes Diabetes in her father; Prostate cancer in her father; Sudden death (age of onset: 63) in her mother.   ROS:  Please see the history of present illness.   All other systems are personally reviewed and negative.   Vitals:   04/19/22 1100  BP: (!) 140/80  Pulse: 84  SpO2: 100%  Weight: 70.4 kg (155 lb 3.2 oz)   Exam:   General:  Elderly. No resp difficulty HEENT: normal Neck: supple. no JVD. Carotids 2+ bilat; no bruits. No lymphadenopathy or thryomegaly appreciated. Cor: PMI nondisplaced. Regular  rate & rhythm. No rubs, gallops or murmurs. Lungs: clear with mild basilar crackles Abdomen: soft, nontender, nondistended. No hepatosplenomegaly. No bruits or masses. Good bowel sounds. Extremities: no cyanosis, clubbing, rash, edema Neuro: alert & orientedx3, cranial nerves grossly intact. moves all 4 extremities w/o difficulty. Affect pleasant  ECG: Atrial sensed 82 Personally reviewed  Recent Labs: 12/16/2021: B Natriuretic Peptide 162.4 12/18/2021: Magnesium 2.1 04/04/2022: ALT 7; BUN 14; Creatinine, Ser 0.81; Hemoglobin 13.0; Platelets 259.0; Potassium 3.9; Sodium 138; TSH 2.61  Personally reviewed   Wt Readings from Last 3 Encounters:  04/19/22 70.4 kg (155 lb 3.2 oz)  04/04/22 70.4 kg (155 lb 3.2 oz)  03/21/22 70.8 kg (156 lb)     ASSESSMENT AND PLAN:  1. Exertional CP - this has been persistent - we discussed cath versus cardiac CT  2. 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 Elsworth Douglas in Pulmonary - Much improved with steroids. - VQ is negative. - RHC 10/15/18 with normal pulmonary pressures and high output physiology without evidence of intracardiac shunting so not candidate for selective pulmonary vasodilators  - Now on CellCept per Dr. Amil Douglas.  3. PVCs - zio 3/22 rare PVCs  4. Symptomatic bradycardia with high-grade HB  - s/p Abbott dual chamber PM 5/23 - Has > 90% RV pacing - If becomes symptomatic or EF drops with RV pacing consider upgrade to CRT - Will repeat echo   5. Chronic hypoxic respiratory failure - likely due to ILD & less PAH - Much improved with treatment of MCTD. Only wears O2 at night now - Followed closely by Dr Sabrina Douglas. Last hi-res CT 4/22 slightly worse ILD  - continue current regimen - stable   6. Afib, paroxysmal/PSVT - In NSR today. No AF last visit or on Zio 3/22 - This patients CHA2DS2-VASc is 4.  - Continue Eliquis 5 bid. No bleeding - < 1% AF burden on PM today    7. HTN - SBP 110-120 at home  Glori Bickers, MD  04/19/2022 11:17 AM  [facility] [place] [address] [phone] (office) [phone] (fax)  11:17 AM

## 2022-11-08 ENCOUNTER — Telehealth: Payer: Self-pay | Admitting: *Deleted

## 2022-11-15 DIAGNOSIS — Z6826 Body mass index (BMI) 26.0-26.9, adult: Secondary | ICD-10-CM | POA: Diagnosis not present

## 2022-11-15 DIAGNOSIS — I7300 Raynaud's syndrome without gangrene: Secondary | ICD-10-CM | POA: Diagnosis not present

## 2022-11-15 DIAGNOSIS — I272 Pulmonary hypertension, unspecified: Secondary | ICD-10-CM | POA: Diagnosis not present

## 2022-11-15 DIAGNOSIS — M351 Other overlap syndromes: Secondary | ICD-10-CM | POA: Diagnosis not present

## 2022-11-15 DIAGNOSIS — J849 Interstitial pulmonary disease, unspecified: Secondary | ICD-10-CM | POA: Diagnosis not present

## 2022-11-15 DIAGNOSIS — I4891 Unspecified atrial fibrillation: Secondary | ICD-10-CM | POA: Diagnosis not present

## 2022-11-15 DIAGNOSIS — E663 Overweight: Secondary | ICD-10-CM | POA: Diagnosis not present

## 2022-11-17 ENCOUNTER — Other Ambulatory Visit: Payer: Self-pay | Admitting: Internal Medicine

## 2022-11-17 DIAGNOSIS — K219 Gastro-esophageal reflux disease without esophagitis: Secondary | ICD-10-CM

## 2022-11-24 ENCOUNTER — Ambulatory Visit (HOSPITAL_COMMUNITY)
Admission: RE | Admit: 2022-11-24 | Discharge: 2022-11-24 | Disposition: A | Discharge status: Home / Self Care | Payer: Medicare PPO | Source: Ambulatory Visit | Attending: Internal Medicine | Admitting: Internal Medicine

## 2022-11-24 DIAGNOSIS — I509 Heart failure, unspecified: Secondary | ICD-10-CM | POA: Diagnosis not present

## 2022-11-24 DIAGNOSIS — I5032 Chronic diastolic (congestive) heart failure: Secondary | ICD-10-CM | POA: Diagnosis not present

## 2022-11-24 DIAGNOSIS — E785 Hyperlipidemia, unspecified: Secondary | ICD-10-CM | POA: Insufficient documentation

## 2022-11-24 LAB — ECHOCARDIOGRAM COMPLETE
Area-P 1/2: 2.54 cm2
Calc EF: 60.9 %
S' Lateral: 2.6 cm
Single Plane A2C EF: 58.9 %
Single Plane A4C EF: 62.9 %

## 2022-11-24 NOTE — Progress Notes (Signed)
Echocardiogram 2D Echocardiogram has been performed.  Augustine Radar 11/24/2022, 12:05 PM

## 2022-11-30 ENCOUNTER — Telehealth (HOSPITAL_COMMUNITY): Payer: Self-pay

## 2022-11-30 ENCOUNTER — Ambulatory Visit (HOSPITAL_COMMUNITY): Payer: Medicare PPO

## 2022-11-30 NOTE — Telephone Encounter (Signed)
Daughter phoned and stated her CT was cancelled because they said no Berkley Harvey was approved. However, she called ins company and got auth number of [id number] and they told her we would need to update authorization because she cannot get her CT until May 14th. Can you check on this and call her back?

## 2022-12-06 DIAGNOSIS — J849 Interstitial pulmonary disease, unspecified: Secondary | ICD-10-CM | POA: Diagnosis not present

## 2022-12-06 DIAGNOSIS — E663 Overweight: Secondary | ICD-10-CM | POA: Diagnosis not present

## 2022-12-06 DIAGNOSIS — M351 Other overlap syndromes: Secondary | ICD-10-CM | POA: Diagnosis not present

## 2022-12-06 DIAGNOSIS — I272 Pulmonary hypertension, unspecified: Secondary | ICD-10-CM | POA: Diagnosis not present

## 2022-12-06 DIAGNOSIS — I4891 Unspecified atrial fibrillation: Secondary | ICD-10-CM | POA: Diagnosis not present

## 2022-12-06 DIAGNOSIS — Z6826 Body mass index (BMI) 26.0-26.9, adult: Secondary | ICD-10-CM | POA: Diagnosis not present

## 2022-12-06 DIAGNOSIS — G629 Polyneuropathy, unspecified: Secondary | ICD-10-CM | POA: Diagnosis not present

## 2022-12-06 DIAGNOSIS — I7300 Raynaud's syndrome without gangrene: Secondary | ICD-10-CM | POA: Diagnosis not present

## 2022-12-06 NOTE — Telephone Encounter (Signed)
error 

## 2022-12-19 ENCOUNTER — Ambulatory Visit (INDEPENDENT_AMBULATORY_CARE_PROVIDER_SITE_OTHER): Payer: Medicare PPO

## 2022-12-19 DIAGNOSIS — I442 Atrioventricular block, complete: Secondary | ICD-10-CM | POA: Diagnosis not present

## 2022-12-20 ENCOUNTER — Ambulatory Visit (HOSPITAL_COMMUNITY): Payer: Medicare PPO

## 2022-12-20 LAB — CUP PACEART REMOTE DEVICE CHECK
Battery Remaining Longevity: 55 mo
Battery Remaining Percentage: 85 %
Battery Voltage: 2.98 V
Brady Statistic AP VP Percent: 12 %
Brady Statistic AP VS Percent: 1 %
Brady Statistic AS VP Percent: 88 %
Brady Statistic AS VS Percent: 1 %
Brady Statistic RA Percent Paced: 11 %
Brady Statistic RV Percent Paced: 99 %
Date Time Interrogation Session: 20240513020017
Implantable Lead Connection Status: 753985
Implantable Lead Connection Status: 753985
Implantable Lead Implant Date: 20230512
Implantable Lead Implant Date: 20230512
Implantable Lead Location: 753859
Implantable Lead Location: 753860
Implantable Pulse Generator Implant Date: 20230512
Lead Channel Impedance Value: 410 Ohm
Lead Channel Impedance Value: 430 Ohm
Lead Channel Pacing Threshold Amplitude: 0.75 V
Lead Channel Pacing Threshold Amplitude: 0.75 V
Lead Channel Pacing Threshold Pulse Width: 0.4 ms
Lead Channel Pacing Threshold Pulse Width: 0.4 ms
Lead Channel Sensing Intrinsic Amplitude: 12 mV
Lead Channel Sensing Intrinsic Amplitude: 2.2 mV
Lead Channel Setting Pacing Amplitude: 3.5 V
Lead Channel Setting Pacing Amplitude: 3.5 V
Lead Channel Setting Pacing Pulse Width: 0.4 ms
Lead Channel Setting Sensing Sensitivity: 2 mV
Pulse Gen Model: 2272
Pulse Gen Serial Number: 8063350

## 2022-12-22 ENCOUNTER — Telehealth: Payer: Self-pay

## 2022-12-22 NOTE — Progress Notes (Signed)
Care Management & Coordination Services Pharmacy Team  Reason for Encounter: Appointment Reminder  Contacted patient to confirm in office appointment with Delano Metz, PharmD on 12/27/2022 at 11:00. Spoke with patient on 12/23/2022 Advised appointment is in Dr. Ardyth Harps office.   Have you seen any other providers since your last visit? **Dr. Dierdre Forth put her on gavescon for legs itching and burning.  Any changes in your medications or health? Patient denies  Any side effects from any medications? Patient denies  Do you have an symptoms or problems not managed by your medications? Patient denies  Any concerns about your health right now? Patient denies  Has your provider asked that you check blood pressure, blood sugar, or follow special diet at home? Yes, patient is checking her blood pressures  Do you get any type of exercise on a regular basis? Patient states she is up walking around her house daily, she is able to bathe, cook and do some cleaning  Can you think of a goal you would like to reach for your health? Patient would like to get some energy back so she can do some of the things she used to do.   Do you have any problems getting your medications? Patient denies  Is there anything that you would like to discuss during the appointment? Patient denies  Patient notified to bring medications and supplements to appointment   Chart review:  Recent office visits:  04/05/2023 Philip Aspen, Limmie Patricia, MD - Patient was seen for Encounter for preventive health examination and additional concerns. Sarted Macrobid  Recent consult visits:  11/15/2022 Alben Deeds (rheumatology) - Patient was seen for Raynaud's disease without gangrene and additional concerns. No additional chart notes.   08/17/2022 Cyril Mourning MD (pulmonary) - Patient was seen for ILD. No medication changes.   Hospital visits:  None   Care Gaps: AWV - completed 04/04/2022 Last BP - 110/80 on  08/17/2022 Tdap - never done Shingrix - never done Covid - overdue  Star Rating Drugs:  None  [name]  Clinical Pharmacist Assistant [phone]

## 2022-12-26 NOTE — Progress Notes (Unsigned)
Care Management & Coordination Services Pharmacy Note  12/27/2022 Name:  Sabrina Douglas MRN:  [id number] DOB:  11-Oct-1940  Summary: BP at goal <140/90 LDL not at goal <100 Denies any signs of bleed with Eliquis  Recommendations/Changes made from today's visit: -Counseled to continue to monitor BP at home 2-3x/week and keep a log -Counseled to be mindful of fluid intake for edema -Counseled extensively on a cholesterol lowering diet -Counseled to monitor for signs of bleed and to notify us if any falls  Follow up plan: -BP call in 4 months -Pharmacist visit in Dec   Subjective: Sabrina Douglas is an 82 y.o. year old female who is a primary patient of Sabrina Douglas, Sabrina Patricia, Douglas.  The care coordination team was consulted for assistance with disease management and care coordination needs.    Engaged with patient face to face for initial visit. Presents with her daughter Sabrina Douglas and all of her medications. Is a retired Lawyer. Cooks most of her meals but daughters who live nearby do send her meals through food delivery also, like [employer]. Does live alone but all children are nearby and she voices a strong support system.  Recent office visits: 04/04/2022 Sabrina Douglas, Sabrina Patricia, Douglas - Patient was seen for Encounter for preventive health examination and additional concerns. Sarted Macrobid   Recent consult visits: 11/15/2022 Sabrina Douglas (rheumatology) - Patient was seen for Raynaud's disease without gangrene and additional concerns. No additional chart notes.    08/17/2022 Sabrina Douglas (pulmonary) - Patient was seen for ILD. No medication changes.   Hospital visits: None in previous 6 months   Objective:  Lab Results  Component Value Date   CREATININE 0.81 04/04/2022   BUN 14 04/04/2022   GFR 67.42 04/04/2022   GFRNONAA 56 (L) 12/18/2021   GFRAA >60 12/12/2019   NA 138 04/04/2022   K 3.9 04/04/2022   CALCIUM 9.6 04/04/2022   CO2 30 04/04/2022    GLUCOSE 105 (H) 04/04/2022    Lab Results  Component Value Date/Time   HGBA1C 5.8 04/04/2022 09:26 AM   HGBA1C 5.9 03/30/2021 09:58 AM   GFR 67.42 04/04/2022 09:26 AM   GFR 58.84 (L) 10/05/2021 09:46 AM    Last diabetic Eye exam: No results found for: "HMDIABEYEEXA"  Last diabetic Foot exam: No results found for: "HMDIABFOOTEX"   Lab Results  Component Value Date   CHOL 212 (H) 04/04/2022   HDL 46.60 04/04/2022   LDLCALC 143 (H) 04/04/2022   TRIG 112.0 04/04/2022   CHOLHDL 5 04/04/2022       Latest Ref Rng & Units 04/04/2022    9:26 AM 12/16/2021    8:32 PM 10/05/2021    9:46 AM  Hepatic Function  Total Protein 6.0 - 8.3 g/dL 8.2  8.9  8.5   Albumin 3.5 - 5.2 g/dL 4.3  4.3  4.4   AST 0 - 37 U/L 22  26  22    ALT 0 - 35 U/L 7  11  7    Alk Phosphatase 39 - 117 U/L 90  85  80   Total Bilirubin 0.2 - 1.2 mg/dL 0.4  0.7  0.6     Lab Results  Component Value Date/Time   TSH 2.61 04/04/2022 09:26 AM   TSH 4.364 12/16/2021 10:37 PM   TSH 2.90 03/30/2021 09:58 AM       Latest Ref Rng & Units 04/04/2022    9:26 AM 12/18/2021    3:20 AM 12/16/2021    8:32  PM  CBC  WBC 4.0 - 10.5 K/uL 3.0  4.7  4.1   Hemoglobin 12.0 - 15.0 g/dL 30.8  65.7  84.6   Hematocrit 36.0 - 46.0 % 39.4  40.4  44.0   Platelets 150.0 - 400.0 K/uL 259.0  228  279     Lab Results  Component Value Date/Time   VD25OH 49.28 04/04/2022 09:26 AM   VD25OH 50.97 10/05/2021 09:46 AM   VITAMINB12 435 04/04/2022 09:26 AM   VITAMINB12 501 03/30/2021 09:58 AM    Clinical ASCVD: No  The ASCVD Risk score (Arnett DK, et al., 2019) failed to calculate for the following reasons:   The 2019 ASCVD risk score is only valid for ages 101 to 62       04/04/2022    8:34 AM 03/30/2021    9:06 AM 03/25/2020    8:35 AM  Depression screen PHQ 2/9  Decreased Interest 2 0 1  Down, Depressed, Hopeless 0 1 1  PHQ - 2 Score 2 1 2   Altered sleeping 1 1 0  Tired, decreased energy 1 2 0  Change in appetite 0 0 0  Feeling bad  or failure about yourself  0 0 0  Trouble concentrating 0 1 0  Moving slowly or fidgety/restless 0 0 0  Suicidal thoughts 0 0 0  PHQ-9 Score 4 5 2   Difficult doing work/chores Not difficult at all Not difficult at all Not difficult at all     Social History   Tobacco Use  Smoking Status Never   Passive exposure: Yes  Smokeless Tobacco Never   BP Readings from Last 3 Encounters:  12/27/22 124/83  11/03/22 (!) 154/90  08/17/22 110/80   Pulse Readings from Last 3 Encounters:  12/27/22 73  11/03/22 96  08/17/22 85   Wt Readings from Last 3 Encounters:  11/03/22 161 lb (73 kg)  08/17/22 154 lb 9.6 oz (70.1 kg)  04/19/22 155 lb 3.2 oz (70.4 kg)   BMI Readings from Last 3 Encounters:  11/03/22 27.64 kg/m  08/17/22 26.54 kg/m  04/19/22 24.67 kg/m    Allergies  Allergen Reactions   Pedi-Pre Tape Spray [Wound Dressing Adhesive] Other (See Comments)    Blisters and bruisers    Medications Reviewed Today     Reviewed by Sabrina Douglas, CMA (Certified Medical Assistant) on 11/03/22 at [phone]  Med List Status: <None>   Medication Order Taking? Sig Documenting Provider Last Dose Status Informant  acetaminophen (TYLENOL) 325 MG tablet [id number] Yes Take 325 mg by mouth every 6 (six) hours as needed for moderate pain or headache. Provider, Historical, Douglas Taking Active Self  cholecalciferol (VITAMIN D3) 25 MCG (1000 UNIT) tablet [id number] Yes Take 1 tablet (1,000 Units total) by mouth daily. Sabrina Douglas Taking Active   ELIQUIS 5 MG TABS tablet [id number] Yes TAKE 1 TABLET(5 MG) BY MOUTH TWICE DAILY Douglas, Sabrina Buckles, Douglas Taking Active Self  fexofenadine (ALLEGRA) 180 MG tablet [id number] Yes Take 180 mg by mouth daily as needed for allergies or rhinitis. Provider, Historical, Douglas Taking Active Self  furosemide (LASIX) 40 MG tablet [id number] Yes TAKE 1 TABLET(40 MG) BY MOUTH DAILY Sabrina Douglas, Sabrina Patricia, Douglas Taking Active   Multiple Vitamin (MULTIVITAMIN WITH MINERALS) TABS  tablet [id number] Yes Take 1 tablet by mouth daily. Provider, Historical, Douglas Taking Active Self  mycophenolate (CELLCEPT) 500 MG tablet [id number] Yes Take 500 mg by mouth 2 (two) times daily. Provider, Historical, Douglas Taking Active Self  nystatin (MYCOSTATIN/NYSTOP) powder  [id number] Yes Apply 1 Application topically 3 (three) times daily. Sabrina Douglas, Sabrina Patricia, Douglas Taking Active   OXYGEN [id number] Yes Inhale 1.5 L into the lungs See admin instructions. At bedtime  During the day as needed for shortness of breath Provider, Historical, Douglas Taking Active Self  pantoprazole (PROTONIX) 40 MG tablet [id number] Yes Take 1 tablet (40 mg total) by mouth daily. Sabrina Douglas, Sabrina Patricia, Douglas Taking Active   RESTASIS 0.05 % ophthalmic emulsion [id number] Yes Place 1 drop into both eyes 2 (two) times daily. 8:30 am and 6:30 pm Provider, Historical, Douglas Taking Active Self  SYSTANE ULTRA 0.4-0.3 % SOLN [id number] Yes Apply 1 drop to eye 2 (two) times daily. 12:30 pm and 8 pm Provider, Historical, Douglas Taking Active Self            SDOH:  (Social Determinants of Health) assessments and interventions performed: Yes SDOH Interventions    Flowsheet Row Care Coordination from 12/27/2022 in [facility]  SDOH Interventions   Food Insecurity Interventions Intervention Not Indicated  Housing Interventions Intervention Not Indicated       Medication Assistance: None required.  Patient affirms current coverage meets needs.  Medication Access: Name and location of current pharmacy:  [id number] [place], [place] - [address] AT [place] [facility] & [address] [place] [address] Phone: [phone] Fax: [phone]  [facility] [address] [place] [address] Phone: [phone] Fax: [phone]  Within the past 30 days, how often has patient missed a dose of medication? None Is a pillbox or other method  used to improve adherence? Yes  Factors that may affect medication adherence? no barriers identified Are meds synced by current pharmacy? No  Are meds delivered by current pharmacy? No  Does patient experience delays in picking up medications due to transportation concerns? No   Compliance/Adherence/Medication fill history: Care Gaps: AWV - completed 04/04/2022 Last BP - 110/80 on 08/17/2022 Tdap - never done Shingrix - never done Covid - overdue  Star-Rating Drugs: None   Assessment/Plan Hypertension/Edema (BP goal <140/90) -Controlled -Current treatment: Lasix 40mg  1 qd Appropriate, Effective, Safe, Accessible -Medications previously tried: Metoprolol, Spironolactone -Current home readings: reports all home readings <140/90, checking about 2-3x/week currently, today was 124/83 HR 73 -Current dietary habits: does add salt to food and eats foods high in sodium like pork -Current exercise habits: limited due to ILD and balance -Denies hypotensive/hypertensive symptoms -Educated on BP goals and benefits of medications for prevention of heart attack, stroke and kidney damage; Importance of home blood pressure monitoring; Proper BP monitoring technique; -Counseled to monitor BP at home 2-3x/week, document, and provide log at future appointments -Counseled on diet and exercise extensively Recommended to continue current medication  Query Hyperlipidemia: (LDL goal < 100) -Uncontrolled -Current treatment: None -Medications previously tried: Simvastatin  -Current dietary patterns: high in cholesterol (eggs, bacon, bread, regular dairy) -Current exercise habits: see above -Educated on Cholesterol goals;  Importance of limiting foods high in cholesterol; -Counseled on diet and exercise extensively, handout provided in office on cholesterol contents of food and reviewed.  Atrial Fibrillation (Goal: prevent stroke and major bleeding) -Controlled -CHADSVASC: 4 -Current  treatment: Eliquis 5mg  BID Appropriate, Effective, Safe, Accessible -Medications previously tried: Metoprolol, Amiodarone -Home BP and HR readings: see above  -Counseled on increased risk of stroke due to Afib and benefits of anticoagulation for stroke prevention; importance of adherence to anticoagulant exactly  as prescribed; bleeding risk associated with Eliquis and importance of self-monitoring for signs/symptoms of bleeding; avoidance of NSAIDs due to increased bleeding risk with anticoagulants; seeking medical attention after a head injury or if there is blood in the urine/stool; -Recommended to continue current medication  ILD (Goal: Preserve pulmonary function) -Not assessed today -Current treatment  Mycophenolate 500mg  1 BID Appropriate, Effective, Safe, Accessible Oxygen Appropriate, Effective, Safe, Accessible  Osteopenia (Goal: Prevent bone loss and bone fracture) -Not assessed today -Current treatment  Vit D 1000 units daily Appropriate, Effective, Safe, Accessible -Last DEXA 04/15/21, due for updated scan in Sept  Dysphagia (Goal: Improve ability to swallow and digest) -Not assessed today -Current treatment  Pantoprazole 40mg  1 qd Appropriate, Effective, Safe, Accessible  OTC -Current treatment  Allegra 180mg  1 qd Appropriate, Effective, Safe, Accessible MVI 1 qd Appropriate, Effective, Safe, Accessible Acetaminophen 325mg  prn Appropriate, Effective, Safe, Accessible Systane eye drops PRN Appropriate, Effective, Safe, Accessible   Sherrill Raring Clinical Pharmacist [phone]

## 2022-12-27 ENCOUNTER — Ambulatory Visit: Payer: Medicare PPO

## 2022-12-28 ENCOUNTER — Other Ambulatory Visit: Payer: Self-pay

## 2022-12-28 MED ORDER — APIXABAN 5 MG PO TABS: 5.0000 mg | ORAL_TABLET | Freq: Two times a day (BID) | ORAL | 1 refills | Status: DC

## 2022-12-28 NOTE — Telephone Encounter (Signed)
Pt last saw Dr Gala Romney 11/03/22, last labs 04/04/22 Creat 0.81, age 82, weight 73kg, based on specified criteria pt is on appropriate dosage of Eliquis 5mg  BID for afib.  Will refill rx.

## 2022-12-29 ENCOUNTER — Telehealth (HOSPITAL_COMMUNITY): Payer: Self-pay | Admitting: Cardiology

## 2022-12-29 NOTE — Telephone Encounter (Signed)
Ct  AUTHORIZATION NUMBER [id number] CPT [phone]

## 2023-01-11 ENCOUNTER — Ambulatory Visit (HOSPITAL_COMMUNITY)
Admission: RE | Admit: 2023-01-11 | Discharge: 2023-01-11 | Disposition: A | Discharge status: Home / Self Care | Payer: Medicare PPO | Source: Ambulatory Visit | Attending: Internal Medicine | Admitting: Internal Medicine

## 2023-01-11 DIAGNOSIS — I5032 Chronic diastolic (congestive) heart failure: Secondary | ICD-10-CM

## 2023-01-11 DIAGNOSIS — J479 Bronchiectasis, uncomplicated: Secondary | ICD-10-CM | POA: Diagnosis not present

## 2023-01-11 MED ORDER — IOHEXOL 350 MG/ML SOLN
100.0000 mL | Freq: Once | INTRAVENOUS | Status: AC | PRN
  Administered 2023-01-11: 100 mL via INTRAVENOUS

## 2023-01-13 NOTE — Progress Notes (Signed)
Remote pacemaker transmission.   

## 2023-01-30 ENCOUNTER — Ambulatory Visit (HOSPITAL_BASED_OUTPATIENT_CLINIC_OR_DEPARTMENT_OTHER): Payer: Medicare PPO | Admitting: Pulmonary Disease

## 2023-03-08 ENCOUNTER — Encounter (INDEPENDENT_AMBULATORY_CARE_PROVIDER_SITE_OTHER): Payer: Self-pay

## 2023-03-14 ENCOUNTER — Telehealth (HOSPITAL_COMMUNITY): Payer: Self-pay | Admitting: Cardiology

## 2023-03-14 ENCOUNTER — Telehealth: Payer: Self-pay | Admitting: Pulmonary Disease

## 2023-03-14 DIAGNOSIS — M351 Other overlap syndromes: Secondary | ICD-10-CM | POA: Diagnosis not present

## 2023-03-14 DIAGNOSIS — G629 Polyneuropathy, unspecified: Secondary | ICD-10-CM | POA: Diagnosis not present

## 2023-03-14 DIAGNOSIS — I272 Pulmonary hypertension, unspecified: Secondary | ICD-10-CM | POA: Diagnosis not present

## 2023-03-14 DIAGNOSIS — J849 Interstitial pulmonary disease, unspecified: Secondary | ICD-10-CM | POA: Diagnosis not present

## 2023-03-14 DIAGNOSIS — Z6826 Body mass index (BMI) 26.0-26.9, adult: Secondary | ICD-10-CM | POA: Diagnosis not present

## 2023-03-14 DIAGNOSIS — I7300 Raynaud's syndrome without gangrene: Secondary | ICD-10-CM | POA: Diagnosis not present

## 2023-03-14 DIAGNOSIS — E663 Overweight: Secondary | ICD-10-CM | POA: Diagnosis not present

## 2023-03-14 DIAGNOSIS — I4891 Unspecified atrial fibrillation: Secondary | ICD-10-CM | POA: Diagnosis not present

## 2023-03-14 NOTE — Telephone Encounter (Signed)
Patient seen by rheumatology and started on prednisone taper (5 mg BID x 1 week, 5 mg daily x 1 week)  Patient was adamant to have Prednisone reviewed for confirmation by Dr Gala Romney. Wanted to make sure it was ok to start with current meds and pacemaker

## 2023-03-15 NOTE — Telephone Encounter (Signed)
Results have been relayed to the patient/authorized caretaker. The patient/authorized caretaker verbalized understanding. No questions at this time.   

## 2023-03-15 NOTE — Telephone Encounter (Signed)
Pt aware via daughter paula

## 2023-03-20 ENCOUNTER — Ambulatory Visit: Payer: Medicare PPO

## 2023-03-20 DIAGNOSIS — R001 Bradycardia, unspecified: Secondary | ICD-10-CM | POA: Diagnosis not present

## 2023-03-20 DIAGNOSIS — I442 Atrioventricular block, complete: Secondary | ICD-10-CM

## 2023-03-27 ENCOUNTER — Other Ambulatory Visit: Payer: Self-pay | Admitting: Internal Medicine

## 2023-03-27 DIAGNOSIS — I272 Pulmonary hypertension, unspecified: Secondary | ICD-10-CM

## 2023-03-29 ENCOUNTER — Encounter (HOSPITAL_BASED_OUTPATIENT_CLINIC_OR_DEPARTMENT_OTHER): Payer: Self-pay | Admitting: Pulmonary Disease

## 2023-03-29 ENCOUNTER — Ambulatory Visit (HOSPITAL_BASED_OUTPATIENT_CLINIC_OR_DEPARTMENT_OTHER): Payer: Medicare PPO | Admitting: Pulmonary Disease

## 2023-03-29 VITALS — BP 126/82 | HR 88 | Resp 14 | Ht 64.0 in | Wt 157.6 lb

## 2023-03-29 DIAGNOSIS — M351 Other overlap syndromes: Secondary | ICD-10-CM

## 2023-03-29 DIAGNOSIS — J849 Interstitial pulmonary disease, unspecified: Secondary | ICD-10-CM | POA: Diagnosis not present

## 2023-03-29 NOTE — Assessment & Plan Note (Signed)
Stable clinically and by imaging.  We will defer PFTs. 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

## 2023-03-29 NOTE — Patient Instructions (Addendum)
Discussed with Dr. Dierdre Forth about extending course of prednisone or increasing dose of CellCept  Take flu shot when available

## 2023-03-29 NOTE — Progress Notes (Signed)
Subjective:    Patient ID: Sabrina Douglas, female    DOB: Sep 23, 1940, 82 y.o.   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    Sees Dr. 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 Fib Seen by ENT for dysphagia  Chief Complaint  Patient presents with   Follow-up    5 month FU. Doing pretty good, her joints bothering her a lot, Breathing is ok, still using Oxygen at nigh and once in a while in the daytime. Is currently on prednisone 5 mg for 2 weeks to help with soreness in shoulders and legs.    Accompanied by her daughter. She reports pain in all joints, she reports knots in her subcutaneous tissue in her thighs and her shoulders may be some darkening of her skin on her upper arms.  Reviewed rheumatology consultation, she was started on prednisone for a 2-week course.  She has 2 more days left daughter wonders if she needs a longer course.  She is concerned about long-term effects of steroids she is on CellCept. Breathing is stable.  She masks when she goes outdoors Reviewed CHF consultation, CT angiogram chest did not show significant pulmonary hypertension or CAD  Significant tests/ events reviewed   PFTs 04/2020 -mild restriction, ratio 85, FEV1 92%, FVC 84%, TLC 65%, DLCO 62% >> much improved compared to prior     ANA positive 1: 1280, Sm Ab 2.0, RNP > 8.0 ,CCP negative    10/02/2018 PFTs ratio 92, moderate to severe restriction FEV1 66%, FVC 56%, TLC 46%, DLCO 17% corrects to 51% for alveolar volume   Echo 05/2021 normal RVSP Echo 09/2018 RVSP 67 Cath 10/2018 RA 3, PA 39/7 , CI 3.8 PVR2.1 Wu >> normal pulmonary pressures, high-output physiology     HRCT chest  11/2022 unchanged fibrosis "alternate" pattern HRCT 11/2020 Basilar predominant bronchiectasis, ground-glass and volume loss,progressive from 05/22/2019 " alternative " (not UIP ) ? Fibrotic NSIP   HRCT 05/2019 Bilateral lower lobe predominant bronchiectasis and volume loss may be postinfectious or post inflammatory in etiology HRCT 09/10/2018 -probable UIP  Esophagram 09/2018 smooth narrowing of distal esophagus?  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 Also denies presyncope, palpitations, heartburn, abdominal pain, nausea, vomiting, diarrhea or change in bowel or urinary habits, dysuria,hematuria, rash, arthralgias, visual complaints, headache, numbness weakness or ataxia.     Objective:   Physical Exam  Gen. Pleasant, obese, elderly, in no distress ENT - no lesions, no post nasal drip Neck: No JVD, no thyromegaly, no carotid bruits Lungs: no use of accessory muscles, no dullness to percussion, decreased without rales or rhonchi  Cardiovascular: Rhythm regular, heart sounds  normal, no murmurs or gallops, no peripheral edema Musculoskeletal: No deformities, no cyanosis or clubbing , no tremors       Assessment & Plan:

## 2023-03-29 NOTE — Assessment & Plan Note (Signed)
Continue CellCept. Options include continuing low-dose prednisone to control disease activity in her joints and skin versus increasing dose of CellCept.  She will discuss with rheumatology

## 2023-04-03 NOTE — Progress Notes (Signed)
Remote pacemaker transmission.   

## 2023-04-06 ENCOUNTER — Ambulatory Visit (INDEPENDENT_AMBULATORY_CARE_PROVIDER_SITE_OTHER): Payer: Medicare PPO | Admitting: Internal Medicine

## 2023-04-06 ENCOUNTER — Encounter: Payer: Self-pay | Admitting: Internal Medicine

## 2023-04-06 VITALS — BP 110/80 | HR 75 | Temp 98.1°F | Ht 64.0 in | Wt 157.9 lb

## 2023-04-06 DIAGNOSIS — I10 Essential (primary) hypertension: Secondary | ICD-10-CM | POA: Diagnosis not present

## 2023-04-06 DIAGNOSIS — Z0000 Encounter for general adult medical examination without abnormal findings: Secondary | ICD-10-CM

## 2023-04-06 DIAGNOSIS — E559 Vitamin D deficiency, unspecified: Secondary | ICD-10-CM | POA: Diagnosis not present

## 2023-04-06 DIAGNOSIS — I4891 Unspecified atrial fibrillation: Secondary | ICD-10-CM | POA: Diagnosis not present

## 2023-04-06 DIAGNOSIS — M351 Other overlap syndromes: Secondary | ICD-10-CM | POA: Diagnosis not present

## 2023-04-06 DIAGNOSIS — J849 Interstitial pulmonary disease, unspecified: Secondary | ICD-10-CM

## 2023-04-06 DIAGNOSIS — R7302 Impaired glucose tolerance (oral): Secondary | ICD-10-CM | POA: Diagnosis not present

## 2023-04-06 DIAGNOSIS — E785 Hyperlipidemia, unspecified: Secondary | ICD-10-CM

## 2023-04-06 DIAGNOSIS — Z23 Encounter for immunization: Secondary | ICD-10-CM

## 2023-04-06 DIAGNOSIS — J9611 Chronic respiratory failure with hypoxia: Secondary | ICD-10-CM | POA: Diagnosis not present

## 2023-04-06 LAB — COMPREHENSIVE METABOLIC PANEL
ALT: 9 U/L (ref 0–35)
AST: 23 U/L (ref 0–37)
Albumin: 4 g/dL (ref 3.5–5.2)
Alkaline Phosphatase: 86 U/L (ref 39–117)
BUN: 11 mg/dL (ref 6–23)
CO2: 30 meq/L (ref 19–32)
Calcium: 9.8 mg/dL (ref 8.4–10.5)
Chloride: 99 meq/L (ref 96–112)
Creatinine, Ser: 0.74 mg/dL (ref 0.40–1.20)
GFR: 74.62 mL/min (ref >60.00)
Glucose, Bld: 85 mg/dL (ref 70–99)
Potassium: 4 meq/L (ref 3.5–5.1)
Sodium: 137 meq/L (ref 135–145)
Total Bilirubin: 0.4 mg/dL (ref 0.2–1.2)
Total Protein: 7.9 g/dL (ref 6.0–8.3)

## 2023-04-06 LAB — LIPID PANEL
Cholesterol: 196 mg/dL (ref 0–200)
HDL: 45.9 mg/dL (ref >39.00)
LDL Cholesterol: 130 mg/dL — ABNORMAL HIGH (ref 0–99)
NonHDL: 150.3
Total CHOL/HDL Ratio: 4
Triglycerides: 104 mg/dL (ref 0.0–149.0)
VLDL: 20.8 mg/dL (ref 0.0–40.0)

## 2023-04-06 LAB — CBC WITH DIFFERENTIAL/PLATELET
Basophils Absolute: 0 10*3/uL (ref 0.0–0.1)
Basophils Relative: 0.5 % (ref 0.0–3.0)
Eosinophils Absolute: 0.1 10*3/uL (ref 0.0–0.7)
Eosinophils Relative: 3.5 % (ref 0.0–5.0)
HCT: 40 % (ref 36.0–46.0)
Hemoglobin: 12.8 g/dL (ref 12.0–15.0)
Lymphocytes Relative: 22.2 % (ref 12.0–46.0)
Lymphs Abs: 0.8 10*3/uL (ref 0.7–4.0)
MCHC: 31.9 g/dL (ref 30.0–36.0)
MCV: 94.1 fl (ref 78.0–100.0)
Monocytes Absolute: 0.5 10*3/uL (ref 0.1–1.0)
Monocytes Relative: 13.2 % — ABNORMAL HIGH (ref 3.0–12.0)
Neutro Abs: 2.1 10*3/uL (ref 1.4–7.7)
Neutrophils Relative %: 60.6 % (ref 43.0–77.0)
Platelets: 280 10*3/uL (ref 150.0–400.0)
RBC: 4.25 Mil/uL (ref 3.87–5.11)
RDW: 12.6 % (ref 11.5–15.5)
WBC: 3.4 10*3/uL — ABNORMAL LOW (ref 4.0–10.5)

## 2023-04-06 LAB — VITAMIN D 25 HYDROXY (VIT D DEFICIENCY, FRACTURES): VITD: 47.32 ng/mL (ref 30.00–100.00)

## 2023-04-06 LAB — HEMOGLOBIN A1C: Hgb A1c MFr Bld: 6.1 % (ref 4.6–6.5)

## 2023-04-06 NOTE — Progress Notes (Signed)
Established Patient Office Visit     CC/Reason for Visit: Annual preventive exam and subsequent Medicare wellness visit  HPI: Sabrina Douglas is a 82 y.o. female who is coming in today for the above mentioned reasons. 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 Vassie Loll and Dr. Dierdre Forth.  Feeling well other than myalgias that are associated with MCTD.  Has routine eye and dental care.  She is due for Tdap, shingles, flu, COVID vaccinations.  She defers all cancer screening due to age.   Past Medical/Surgical History: Past Medical History:  Diagnosis Date   Allergy    Anxiety    Arthritis    CHF (congestive heart failure) (HCC)    Depression    Hyperlipidemia    Oxygen deficiency     Past Surgical History:  Procedure Laterality Date   ABDOMINAL HYSTERECTOMY     BALLOON DILATION N/A 10/04/2018   Procedure: BALLOON DILATION;  Surgeon: Kathi Der, MD;  Location: [facility];  Service: Gastroenterology;  Laterality: N/A;   BIOPSY  10/04/2018   Procedure: BIOPSY;  Surgeon: Kathi Der, MD;  Location: [facility];  Service: Gastroenterology;;   CATARACT EXTRACTION     ESOPHAGOGASTRODUODENOSCOPY (EGD) WITH PROPOFOL N/A 10/04/2018   Procedure: ESOPHAGOGASTRODUODENOSCOPY (EGD) WITH PROPOFOL;  Surgeon: Kathi Der, MD;  Location: [facility];  Service: Gastroenterology;  Laterality: N/A;   EYE SURGERY  Feb 2023   PACEMAKER IMPLANT N/A 12/17/2021   Procedure: PACEMAKER IMPLANT;  Surgeon: Regan Lemming, MD;  Location: [facility];  Service: Cardiovascular;  Laterality: N/A;   RIGHT HEART CATH N/A 10/15/2018   Procedure: RIGHT HEART CATH;  Surgeon: Dolores Patty, MD;  Location: [facility];  Service: Cardiovascular;  Laterality: N/A;    Social History:  reports that she has never smoked. She has been exposed to tobacco  smoke. She has never used smokeless tobacco. She reports that she does not currently use drugs. She reports that she does not drink alcohol.  Allergies: Allergies  Allergen Reactions   Pedi-Pre Tape Spray [Wound Dressing Adhesive] Other (See Comments)    Blisters and bruisers    Family History:  Family History  Problem Relation Age of Onset   Sudden death Mother 78       sudden death post hysterectomy   Prostate cancer Father    Diabetes Father    Cancer Father    Cancer Brother      Current Outpatient Medications:    acetaminophen (TYLENOL) 325 MG tablet, Take 325 mg by mouth every 6 (six) hours as needed for moderate pain or headache., Disp: , Rfl:    apixaban (ELIQUIS) 5 MG TABS tablet, Take 1 tablet (5 mg total) by mouth 2 (two) times daily., Disp: 180 tablet, Rfl: 1   cholecalciferol (VITAMIN D3) 25 MCG (1000 UNIT) tablet, Take 1 tablet (1,000 Units total) by mouth daily., Disp: , Rfl:    fexofenadine (ALLEGRA) 180 MG tablet, Take 180 mg by mouth daily as needed for allergies or rhinitis., Disp: , Rfl:    furosemide (LASIX) 40 MG tablet, TAKE 1 TABLET(40 MG) BY MOUTH DAILY, Disp: 90 tablet, Rfl: 0   Multiple Vitamin (MULTIVITAMIN WITH MINERALS) TABS tablet, Take 1 tablet by mouth daily., Disp: , Rfl:    mycophenolate (CELLCEPT) 500 MG tablet, Take 500 mg by mouth 2 (two) times daily., Disp: , Rfl:    nystatin (MYCOSTATIN/NYSTOP) powder, Apply 1  Application topically 3 (three) times daily., Disp: 60 g, Rfl: 3   OXYGEN, Inhale 1.5 L into the lungs See admin instructions. At bedtime  During the day as needed for shortness of breath, Disp: , Rfl:    pantoprazole (PROTONIX) 40 MG tablet, TAKE 1 TABLET(40 MG) BY MOUTH DAILY, Disp: 90 tablet, Rfl: 1   RESTASIS 0.05 % ophthalmic emulsion, Place 1 drop into both eyes 2 (two) times daily. 8:30 am and 6:30 pm, Disp: , Rfl:    SYSTANE ULTRA 0.4-0.3 % SOLN, Apply 1 drop to eye 2 (two) times daily. 12:30 pm and 8 pm, Disp: , Rfl:   Review  of Systems:  Negative unless indicated in HPI.   Physical Exam: Vitals:   04/06/23 0908  BP: 110/80  Pulse: 75  Temp: 98.1 F (36.7 C)  TempSrc: Oral  SpO2: 97%  Weight: 157 lb 14.4 oz (71.6 kg)  Height: 5\' 4"  (1.626 m)    Body mass index is 27.1 kg/m.   Physical Exam Vitals reviewed.  Constitutional:      General: She is not in acute distress.    Appearance: Normal appearance. She is not ill-appearing, toxic-appearing or diaphoretic.  HENT:     Head: Normocephalic.     Right Ear: Tympanic membrane, ear canal and external ear normal. There is no impacted cerumen.     Left Ear: Tympanic membrane, ear canal and external ear normal. There is no impacted cerumen.     Nose: Nose normal.     Mouth/Throat:     Mouth: Mucous membranes are moist.     Pharynx: Oropharynx is clear. No oropharyngeal exudate or posterior oropharyngeal erythema.  Eyes:     General: No scleral icterus.       Right eye: No discharge.        Left eye: No discharge.     Conjunctiva/sclera: Conjunctivae normal.     Pupils: Pupils are equal, round, and reactive to light.  Neck:     Vascular: No carotid bruit.  Cardiovascular:     Rate and Rhythm: Normal rate and regular rhythm.     Pulses: Normal pulses.     Heart sounds: Normal heart sounds.  Pulmonary:     Effort: Pulmonary effort is normal. No respiratory distress.     Breath sounds: Normal breath sounds.  Abdominal:     General: Abdomen is flat. Bowel sounds are normal.     Palpations: Abdomen is soft.  Musculoskeletal:        General: Normal range of motion.     Cervical back: Normal range of motion.  Skin:    General: Skin is warm and dry.  Neurological:     General: No focal deficit present.     Mental Status: She is alert and oriented to person, place, and time. Mental status is at baseline.  Psychiatric:        Mood and Affect: Mood normal.        Behavior: Behavior normal.        Thought Content: Thought content normal.         Judgment: Judgment normal.    Subsequent Medicare wellness visit   1. Risk factors, based on past  M,S,F - Cardiac Risk Factors include: advanced age (>70men, >61 women)   2.  Physical activities: Dietary issues and exercise activities discussed:      3.  Depression/mood:  Garment/textile technologist Visit from 04/06/2023 in [facility] Total Score  9        4.  ADL's:    04/06/2023    9:00 AM  In your present state of health, do you have any difficulty performing the following activities:  Hearing? 0  Vision? 0  Difficulty concentrating or making decisions? 0  Walking or climbing stairs? 1  Comment balance  Dressing or bathing? 0  Doing errands, shopping? 1  Comment daughter  Quarry manager and eating ? N  Using the Toilet? N  In the past six months, have you accidently leaked urine? N  Do you have problems with loss of bowel control? N  Managing your Medications? N  Managing your Finances? Y  Comment daughter  Housekeeping or managing your Housekeeping? N     5.  Fall risk:     12/17/2021    8:00 AM 12/17/2021    8:00 PM 12/18/2021   10:00 AM 04/04/2022    8:35 AM 04/06/2023    9:21 AM  Fall Risk  Falls in the past year?    0 0  Was there an injury with Fall?    0 0  Fall Risk Category Calculator    0 0  Fall Risk Category (Retired)    Low   (RETIRED) Patient Fall Risk Level Moderate fall risk Moderate fall risk Moderate fall risk Low fall risk   Patient at Risk for Falls Due to    No Fall Risks   Fall risk Follow up    Falls evaluation completed Falls evaluation completed     6.  Home safety: No problems identified   7.  Height weight, and visual acuity: height and weight as above, vision/hearing: Vision Screening   Right eye Left eye Both eyes  Without correction 20/40 20/40 20/40   With correction        8.  Counseling: Counseling given: Not Answered    9. Lab orders based on risk factors: Laboratory update will be  reviewed   10. Cognitive assessment:        04/06/2023    9:05 AM  6CIT Screen  What Year? 0 points  What month? 0 points  What time? 0 points  Count back from 20 0 points  Months in reverse 0 points  Repeat phrase 0 points  Total Score 0 points     11. Screening: Patient provided with a written and personalized 5-10 year screening schedule in the AVS. Health Maintenance  Topic Date Due   DTaP/Tdap/Td vaccine (1 - Tdap) Never done   Zoster (Shingles) Vaccine (1 of 2) Never done   COVID-19 Vaccine (4 - 2023-24 season) 04/08/2022   Flu Shot  03/09/2023   Medicare Annual Wellness Visit  04/05/2024   Pneumonia Vaccine  Completed   DEXA scan (bone density measurement)  Completed   HPV Vaccine  Aged Out    12. Provider List Update: Patient Care Team    Relationship Specialty Notifications Start End  Philip Aspen, Limmie Patricia, MD PCP - General Internal Medicine  10/10/18   Regan Lemming, MD PCP - Electrophysiology Clinical Cardiac Electrophysiology  12/18/21   Sherrill Raring, [facility] (Inactive)  Pharmacist  12/27/22      13. Advance Directives: Does Patient Have a Medical Advance Directive?: Yes Type of Advance Directive: Out of facility DNR (pink MOST or yellow form), Living will, Healthcare Power of Attorney  14. Opioids: Patient is not on any opioid prescriptions and has no risk factors for a substance use disorder.   15.  Goals   None      I have personally reviewed and noted the following in the patient's chart:   Medical and social history Use of alcohol, tobacco or illicit drugs  Current medications and supplements Functional ability and status Nutritional status Physical activity Advanced directives List of other physicians Hospitalizations, surgeries, and ER visits in previous 12 months Vitals Screenings to include cognitive, depression, and falls Referrals and appointments  In addition, I have reviewed and discussed with patient certain  preventive protocols, quality metrics, and best practice recommendations. A written personalized care plan for preventive services as well as general preventive health recommendations were provided to patient.   Impression and Plan:  Medicare annual wellness visit, subsequent  Hyperlipidemia, unspecified hyperlipidemia type -     Lipid panel; Future  Essential hypertension -     CBC with Differential/Platelet; Future -     Comprehensive metabolic panel; Future  Vitamin D deficiency -     VITAMIN D 25 Hydroxy (Vit-D Deficiency, Fractures); Future  Immunization due  ILD (interstitial lung disease) (HCC)  Chronic respiratory failure with hypoxia (HCC)  MCTD (mixed connective tissue disease) (HCC)  Atrial fibrillation with RVR (HCC) -     TSH; Future -     Vitamin B12; Future  IGT (impaired glucose tolerance) -     Hemoglobin A1c; Future   -Recommend routine eye and dental care. -Healthy lifestyle discussed in detail. -Labs to be updated today. -Prostate cancer screening: N/A Health Maintenance  Topic Date Due   DTaP/Tdap/Td vaccine (1 - Tdap) Never done   Zoster (Shingles) Vaccine (1 of 2) Never done   COVID-19 Vaccine (4 - 2023-24 season) 04/08/2022   Flu Shot  03/09/2023   Medicare Annual Wellness Visit  04/05/2024   Pneumonia Vaccine  Completed   DEXA scan (bone density measurement)  Completed   HPV Vaccine  Aged Out    -Flu vaccine administered in office today. -Advised to update COVID, Tdap and shingles at pharmacy. -She elects to defer all cancer screening due to age. -She has routine eye and dental care.     Chaya Jan, MD [facility] at [facility]

## 2023-04-07 LAB — VITAMIN B12: Vitamin B-12: 695 pg/mL (ref 211–911)

## 2023-04-07 LAB — TSH: TSH: 1.68 u[IU]/mL (ref 0.35–5.50)

## 2023-04-11 ENCOUNTER — Encounter: Payer: Self-pay | Admitting: Internal Medicine

## 2023-04-11 DIAGNOSIS — R7302 Impaired glucose tolerance (oral): Secondary | ICD-10-CM | POA: Insufficient documentation

## 2023-04-17 NOTE — Addendum Note (Signed)
Addended by: Kern Reap B on: 04/17/2023 11:53 AM   Modules accepted: Orders

## 2023-04-19 ENCOUNTER — Other Ambulatory Visit: Payer: Self-pay | Admitting: *Deleted

## 2023-04-19 DIAGNOSIS — R7302 Impaired glucose tolerance (oral): Secondary | ICD-10-CM

## 2023-04-22 ENCOUNTER — Encounter: Payer: Self-pay | Admitting: Internal Medicine

## 2023-04-22 DIAGNOSIS — R7303 Prediabetes: Secondary | ICD-10-CM

## 2023-04-25 ENCOUNTER — Other Ambulatory Visit (HOSPITAL_COMMUNITY): Payer: Self-pay

## 2023-04-25 DIAGNOSIS — I5032 Chronic diastolic (congestive) heart failure: Secondary | ICD-10-CM

## 2023-04-25 DIAGNOSIS — H26493 Other secondary cataract, bilateral: Secondary | ICD-10-CM | POA: Diagnosis not present

## 2023-04-25 DIAGNOSIS — H524 Presbyopia: Secondary | ICD-10-CM | POA: Diagnosis not present

## 2023-04-25 DIAGNOSIS — Z961 Presence of intraocular lens: Secondary | ICD-10-CM | POA: Diagnosis not present

## 2023-04-25 DIAGNOSIS — H04123 Dry eye syndrome of bilateral lacrimal glands: Secondary | ICD-10-CM | POA: Diagnosis not present

## 2023-04-25 DIAGNOSIS — H35033 Hypertensive retinopathy, bilateral: Secondary | ICD-10-CM | POA: Diagnosis not present

## 2023-04-25 DIAGNOSIS — H52223 Regular astigmatism, bilateral: Secondary | ICD-10-CM | POA: Diagnosis not present

## 2023-04-25 NOTE — Progress Notes (Signed)
Orders Placed This Encounter  Procedures   ECHOCARDIOGRAM COMPLETE    Standing Status:   Future    Standing Expiration Date:   04/24/2024    Order Specific Question:   Where should this test be performed    Answer:   [place]    Order Specific Question:   Perflutren DEFINITY (image enhancing agent) should be administered unless hypersensitivity or allergy exist    Answer:   Administer Perflutren    Order Specific Question:   Reason for exam-Echo    Answer:   Congestive Heart Failure  I50.9    Order Specific Question:   Release to patient    Answer:   Immediate

## 2023-04-27 ENCOUNTER — Other Ambulatory Visit: Payer: Self-pay | Admitting: Internal Medicine

## 2023-04-27 DIAGNOSIS — K219 Gastro-esophageal reflux disease without esophagitis: Secondary | ICD-10-CM

## 2023-04-27 DIAGNOSIS — I272 Pulmonary hypertension, unspecified: Secondary | ICD-10-CM

## 2023-06-15 ENCOUNTER — Telehealth: Payer: Self-pay

## 2023-06-15 DIAGNOSIS — M351 Other overlap syndromes: Secondary | ICD-10-CM | POA: Diagnosis not present

## 2023-06-15 DIAGNOSIS — I7300 Raynaud's syndrome without gangrene: Secondary | ICD-10-CM | POA: Diagnosis not present

## 2023-06-15 DIAGNOSIS — G629 Polyneuropathy, unspecified: Secondary | ICD-10-CM | POA: Diagnosis not present

## 2023-06-15 DIAGNOSIS — I272 Pulmonary hypertension, unspecified: Secondary | ICD-10-CM | POA: Diagnosis not present

## 2023-06-15 DIAGNOSIS — Z6826 Body mass index (BMI) 26.0-26.9, adult: Secondary | ICD-10-CM | POA: Diagnosis not present

## 2023-06-15 DIAGNOSIS — I4891 Unspecified atrial fibrillation: Secondary | ICD-10-CM | POA: Diagnosis not present

## 2023-06-15 DIAGNOSIS — E663 Overweight: Secondary | ICD-10-CM | POA: Diagnosis not present

## 2023-06-15 DIAGNOSIS — J849 Interstitial pulmonary disease, unspecified: Secondary | ICD-10-CM | POA: Diagnosis not present

## 2023-06-15 NOTE — Telephone Encounter (Signed)
Alert remote transmission: high ventricular rates There were 3 HVRs detected that were 20-40 seconds in duration These fast rates show 1:1 conduction with rates of 175-190 bpm, sent to triage    Patient has ongoing history of this.  She is past due for annual follow up with Dr. Tilman Neat or PA.  Forwarding to scheduling to get in.

## 2023-06-16 LAB — LAB REPORT - SCANNED: EGFR: 74

## 2023-06-19 ENCOUNTER — Ambulatory Visit: Payer: Medicare PPO

## 2023-06-19 DIAGNOSIS — R001 Bradycardia, unspecified: Secondary | ICD-10-CM

## 2023-06-19 DIAGNOSIS — I442 Atrioventricular block, complete: Secondary | ICD-10-CM

## 2023-06-19 LAB — CUP PACEART REMOTE DEVICE CHECK
Battery Remaining Longevity: 52 mo
Battery Remaining Percentage: 77 %
Battery Voltage: 2.98 V
Brady Statistic AP VP Percent: 13 %
Brady Statistic AP VS Percent: 1 %
Brady Statistic AS VP Percent: 87 %
Brady Statistic AS VS Percent: 1 %
Brady Statistic RA Percent Paced: 13 %
Brady Statistic RV Percent Paced: 99 %
Date Time Interrogation Session: 20241110071222
Implantable Lead Connection Status: 753985
Implantable Lead Connection Status: 753985
Implantable Lead Implant Date: 20230512
Implantable Lead Implant Date: 20230512
Implantable Lead Location: 753859
Implantable Lead Location: 753860
Implantable Pulse Generator Implant Date: 20230512
Lead Channel Impedance Value: 430 Ohm
Lead Channel Impedance Value: 440 Ohm
Lead Channel Pacing Threshold Amplitude: 0.75 V
Lead Channel Pacing Threshold Amplitude: 0.75 V
Lead Channel Pacing Threshold Pulse Width: 0.4 ms
Lead Channel Pacing Threshold Pulse Width: 0.4 ms
Lead Channel Sensing Intrinsic Amplitude: 12 mV
Lead Channel Sensing Intrinsic Amplitude: 2.5 mV
Lead Channel Setting Pacing Amplitude: 3.5 V
Lead Channel Setting Pacing Amplitude: 3.5 V
Lead Channel Setting Pacing Pulse Width: 0.4 ms
Lead Channel Setting Sensing Sensitivity: 2 mV
Pulse Gen Model: 2272
Pulse Gen Serial Number: 8063350

## 2023-06-23 ENCOUNTER — Telehealth: Payer: Self-pay | Admitting: Pulmonary Disease

## 2023-06-23 NOTE — Telephone Encounter (Signed)
Pt insurance switch DME companies from [place] to  [employer] Order needs to be sent to [employer]

## 2023-06-26 NOTE — Telephone Encounter (Signed)
It 's doesn't look like she was set up though our office for her oxygen.

## 2023-06-28 NOTE — Progress Notes (Unsigned)
Electrophysiology Office Note:   Date:  06/29/2023  ID:  Sabrina, Douglas 06-20-1941, MRN [id number]  Primary Cardiologist: None Electrophysiologist: Will Jorja Loa, MD       History of Present Illness:   Sabrina Douglas is a 82 y.o. female with h/o paroxysmal AF, CHB s/p PPM, PVC's, HTN, HLD, ILD seen today for routine electrophysiology followup.   Remote alert transmission on 06/15/23 showed 3 episodes of HVR's, 20-40 seconds in duration, rates of 175-190 bpm.    Since last being seen in our clinic the patient reports she will occasionally notice at night that she has an irregular heart beat that begins with a sudden "thud" in her chest and racing.  She remains active at home, independent of ADL's. Occasional racing during the day.   She denies chest pain, palpitations, dyspnea, PND, orthopnea, nausea, vomiting, dizziness, syncope, edema, weight gain, or early satiety.   Review of systems complete and found to be negative unless listed in HPI.   EP Information / Studies Reviewed:    EKG is ordered today. Personal review as below.  EKG Interpretation Date/Time:  Thursday June 29 2023 10:24:10 EST Ventricular Rate:  71 PR Interval:  168 QRS Duration:  132 QT Interval:  360 QTC Calculation: 391 R Axis:   4  Text Interpretation: Atrial-sensed ventricular-paced rhythm Confirmed by Canary Brim ([id number]) on 06/29/2023 1:15:26 PM   PPM Interrogation-  reviewed in detail today,  See PACEART report.  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 The patient's score is based upon: CHF History: 0 HTN History: 1 Diabetes History: 0 Stroke History: 0 Vascular  Disease History: 0 Age Score: 2 Gender Score: 1         Physical Exam:   VS:  BP (!) 140/82   Pulse 71   Ht 5\' 4"  (1.626 m)   Wt 159 lb 12.8 oz (72.5 kg)   SpO2 98%   BMI 27.43 kg/m    Wt Readings from Last 3 Encounters:  06/29/23 159 lb 12.8 oz (72.5 kg)  04/06/23 157 lb 14.4 oz (71.6 kg)  03/29/23 157 lb 9.6 oz (71.5 kg)     GEN: Well nourished, well developed in no acute distress NECK: No JVD; No carotid bruits CARDIAC: Regular rate and rhythm, no murmurs, rubs, gallops RESPIRATORY:  Clear to auscultation without rales, wheezing or rhonchi  ABDOMEN: Soft, non-tender, non-distended EXTREMITIES:  No edema; No deformity   ASSESSMENT AND PLAN:    CHB s/p Medtronic PPM  -Normal PPM function -See Pace Art report -No changes today  Paroxysmal Atrial Fibrillation  CHA2DS2-VASc 4  -<1% burden on device but does conduct 1:1 with fast ventricular rates up to 180's briefly -continue OAC for stroke prophylaxis  -add low dose Toprol 25mg  every evening for PAF -asked patient & granddaughter to keep record of her BP for one week and let me know if she has issues with BP, dizziness / lightheadedness. They relayed that she had previously been taken off a beta blocker > only issue found in chart was during hospital admit with IV lopressor on chart review. They thought Dr. Gala Romney took her off beta blockers (? If during timing of CHB issues).  Secondary Hypercoagulable State -continue Eliquis 5mg  BID, appropriate dose by wt/cr  MCTD / Scleroderma with ILD  -follows with Dr. Vassie Loll, Dr. Dierdre Forth, Dr. Gala Romney  -has follow up in CHF Clinic 12/3 > review BP at that time as well   Disposition:   Follow up with Dr. Elberta Fortis or EP APP in 6 months  Signed, Canary Brim, MSN, APRN, NP-C, AGACNP-BC [facility] - Electrophysiology  06/29/2023, 1:18 PM

## 2023-06-29 ENCOUNTER — Telehealth (HOSPITAL_COMMUNITY): Payer: Self-pay | Admitting: Cardiology

## 2023-06-29 ENCOUNTER — Encounter: Payer: Self-pay | Admitting: Pulmonary Disease

## 2023-06-29 ENCOUNTER — Ambulatory Visit: Payer: Medicare PPO | Attending: Pulmonary Disease | Admitting: Pulmonary Disease

## 2023-06-29 VITALS — BP 140/82 | HR 71 | Ht 64.0 in | Wt 159.8 lb

## 2023-06-29 DIAGNOSIS — I480 Paroxysmal atrial fibrillation: Secondary | ICD-10-CM

## 2023-06-29 DIAGNOSIS — I442 Atrioventricular block, complete: Secondary | ICD-10-CM

## 2023-06-29 DIAGNOSIS — Z950 Presence of cardiac pacemaker: Secondary | ICD-10-CM | POA: Diagnosis not present

## 2023-06-29 LAB — CUP PACEART INCLINIC DEVICE CHECK
Battery Remaining Longevity: 93 mo
Battery Voltage: 2.98 V
Brady Statistic RA Percent Paced: 13 %
Brady Statistic RV Percent Paced: 99.7 %
Date Time Interrogation Session: 20241121124853
Implantable Lead Connection Status: 753985
Implantable Lead Connection Status: 753985
Implantable Lead Implant Date: 20230512
Implantable Lead Implant Date: 20230512
Implantable Lead Location: 753859
Implantable Lead Location: 753860
Implantable Pulse Generator Implant Date: 20230512
Lead Channel Impedance Value: 412.5 Ohm
Lead Channel Impedance Value: 412.5 Ohm
Lead Channel Impedance Value: 425 Ohm
Lead Channel Impedance Value: 425 Ohm
Lead Channel Pacing Threshold Amplitude: 0.5 V
Lead Channel Pacing Threshold Amplitude: 0.5 V
Lead Channel Pacing Threshold Amplitude: 0.625 V
Lead Channel Pacing Threshold Amplitude: 0.625 V
Lead Channel Pacing Threshold Pulse Width: 0.4 ms
Lead Channel Pacing Threshold Pulse Width: 0.4 ms
Lead Channel Pacing Threshold Pulse Width: 0.4 ms
Lead Channel Pacing Threshold Pulse Width: 0.4 ms
Lead Channel Sensing Intrinsic Amplitude: 12 mV
Lead Channel Sensing Intrinsic Amplitude: 12 mV
Lead Channel Sensing Intrinsic Amplitude: 2.3 mV
Lead Channel Sensing Intrinsic Amplitude: 2.3 mV
Lead Channel Setting Pacing Amplitude: 0.875
Lead Channel Setting Pacing Amplitude: 1.5 V
Lead Channel Setting Pacing Pulse Width: 0.4 ms
Lead Channel Setting Sensing Sensitivity: 2 mV
Pulse Gen Model: 2272
Pulse Gen Serial Number: 8063350

## 2023-06-29 MED ORDER — METOPROLOL SUCCINATE ER 25 MG PO TB24: 25.0000 mg | ORAL_TABLET | Freq: Every day | ORAL | 3 refills | Status: DC

## 2023-06-29 NOTE — Telephone Encounter (Signed)
Pts daughter called to report Metoprolol was restarted at ep fu  Wanted to notify provider as FYI as family thought Dr Gala Romney stopped in the past for low b/p

## 2023-06-29 NOTE — Patient Instructions (Addendum)
Medication Instructions:  Your physician has recommended you make the following change in your medication:  Start toprol XL 25 mg once daily.  Lab Work: None ordered.  If you have labs (blood work) drawn today and your tests are completely normal, you will receive your results only by: MyChart Message (if you have MyChart) OR A paper copy in the mail If you have any lab test that is abnormal or we need to change your treatment, we will call you to review the results.  Testing/Procedures: Keep blood pressure records for the next week and send or call in those for review.  Follow-Up: At [facility], you and your health needs are our priority.  As part of our continuing mission to provide you with exceptional heart care, we have created designated Provider Care Teams.  These Care Teams include your primary Cardiologist (physician) and Advanced Practice Providers (APPs -  Physician Assistants and Nurse Practitioners) who all work together to provide you with the care you need, when you need it.  Your next appointment:   1 year(s)  The format for your next appointment:   In Person  Provider:   Earnest Rosier, NP or Dr Elberta Fortis  Remote monitoring is used to monitor your Pacemaker/ ICD from home. This monitoring reduces the number of office visits required to check your device to one time per year. It allows [place] to keep an eye on the functioning of your device to ensure it is working properly.   Important Information About Sugar

## 2023-07-03 NOTE — Telephone Encounter (Signed)
Yes she need an appt for this

## 2023-07-03 NOTE — Telephone Encounter (Signed)
Tarsh- Should I make only a 6 min walk appt or a Qualifying Walk appt, or adv PT to contact her O2 prescriber. Thanks.

## 2023-07-03 NOTE — Telephone Encounter (Signed)
Daughter wanted to confirm if it ok to restart    Pts daughter aware and voiced understanding

## 2023-07-11 ENCOUNTER — Ambulatory Visit (HOSPITAL_COMMUNITY)
Admission: RE | Admit: 2023-07-11 | Discharge: 2023-07-11 | Disposition: A | Discharge status: Home / Self Care | Payer: Medicare PPO | Source: Ambulatory Visit | Attending: Family Medicine | Admitting: Family Medicine

## 2023-07-11 ENCOUNTER — Ambulatory Visit (HOSPITAL_BASED_OUTPATIENT_CLINIC_OR_DEPARTMENT_OTHER)
Admission: RE | Admit: 2023-07-11 | Discharge: 2023-07-11 | Disposition: A | Discharge status: Home / Self Care | Payer: Medicare PPO | Source: Ambulatory Visit | Attending: Family Medicine

## 2023-07-11 ENCOUNTER — Encounter (HOSPITAL_COMMUNITY): Payer: Self-pay

## 2023-07-11 VITALS — BP 140/80 | HR 74 | Wt 157.4 lb

## 2023-07-11 DIAGNOSIS — I5032 Chronic diastolic (congestive) heart failure: Secondary | ICD-10-CM | POA: Insufficient documentation

## 2023-07-11 DIAGNOSIS — I493 Ventricular premature depolarization: Secondary | ICD-10-CM | POA: Insufficient documentation

## 2023-07-11 DIAGNOSIS — E785 Hyperlipidemia, unspecified: Secondary | ICD-10-CM | POA: Insufficient documentation

## 2023-07-11 DIAGNOSIS — I2510 Atherosclerotic heart disease of native coronary artery without angina pectoris: Secondary | ICD-10-CM | POA: Diagnosis not present

## 2023-07-11 DIAGNOSIS — M351 Other overlap syndromes: Secondary | ICD-10-CM | POA: Diagnosis not present

## 2023-07-11 DIAGNOSIS — R079 Chest pain, unspecified: Secondary | ICD-10-CM | POA: Diagnosis not present

## 2023-07-11 DIAGNOSIS — I480 Paroxysmal atrial fibrillation: Secondary | ICD-10-CM | POA: Insufficient documentation

## 2023-07-11 DIAGNOSIS — I10 Essential (primary) hypertension: Secondary | ICD-10-CM | POA: Diagnosis not present

## 2023-07-11 DIAGNOSIS — I441 Atrioventricular block, second degree: Secondary | ICD-10-CM | POA: Diagnosis not present

## 2023-07-11 DIAGNOSIS — Z79899 Other long term (current) drug therapy: Secondary | ICD-10-CM | POA: Diagnosis not present

## 2023-07-11 DIAGNOSIS — I110 Hypertensive heart disease with heart failure: Secondary | ICD-10-CM | POA: Insufficient documentation

## 2023-07-11 DIAGNOSIS — Z7901 Long term (current) use of anticoagulants: Secondary | ICD-10-CM | POA: Diagnosis not present

## 2023-07-11 DIAGNOSIS — J9611 Chronic respiratory failure with hypoxia: Secondary | ICD-10-CM | POA: Insufficient documentation

## 2023-07-11 DIAGNOSIS — R0789 Other chest pain: Secondary | ICD-10-CM | POA: Insufficient documentation

## 2023-07-11 DIAGNOSIS — I272 Pulmonary hypertension, unspecified: Secondary | ICD-10-CM | POA: Insufficient documentation

## 2023-07-11 DIAGNOSIS — J8410 Pulmonary fibrosis, unspecified: Secondary | ICD-10-CM | POA: Diagnosis not present

## 2023-07-11 DIAGNOSIS — Z7722 Contact with and (suspected) exposure to environmental tobacco smoke (acute) (chronic): Secondary | ICD-10-CM | POA: Insufficient documentation

## 2023-07-11 DIAGNOSIS — Z9981 Dependence on supplemental oxygen: Secondary | ICD-10-CM | POA: Diagnosis not present

## 2023-07-11 DIAGNOSIS — Z79624 Long term (current) use of inhibitors of nucleotide synthesis: Secondary | ICD-10-CM | POA: Insufficient documentation

## 2023-07-11 LAB — ECHOCARDIOGRAM COMPLETE
AR max vel: 1.99 cm2
AV Peak grad: 6.9 mm[Hg]
Ao pk vel: 1.31 m/s
Area-P 1/2: 3.85 cm2
MV M vel: 1.44 m/s
MV Peak grad: 8.3 mm[Hg]
S' Lateral: 2.6 cm

## 2023-07-11 NOTE — Patient Instructions (Signed)
No Labs done today.   No medication changes were made. Please continue all current medications as prescribed.  Your physician recommends that you schedule a follow-up appointment in: 6 months with Dr. Gala Romney. Please contact our office in April 2025 to schedule a June 2025 appointment.   If you have any questions or concerns before your next appointment please send [place] a message through [place] or call our office at [phone].    TO LEAVE A MESSAGE FOR THE NURSE SELECT OPTION 2, PLEASE LEAVE A MESSAGE INCLUDING: YOUR NAME DATE OF BIRTH CALL BACK NUMBER REASON FOR CALL**this is important as we prioritize the call backs  YOU WILL RECEIVE A CALL BACK THE SAME DAY AS LONG AS YOU CALL BEFORE 4:00 PM   Do the following things EVERYDAY: Weigh yourself in the morning before breakfast. Write it down and keep it in a log. Take your medicines as prescribed Eat low salt foods--Limit salt (sodium) to 2000 mg per day.  Stay as active as you can everyday Limit all fluids for the day to less than 2 liters   At the [facility], you and your health needs are our priority. As part of our continuing mission to provide you with exceptional heart care, we have created designated Provider Care Teams. These Care Teams include your primary Cardiologist (physician) and Advanced Practice Providers (APPs- Physician Assistants and Nurse Practitioners) who all work together to provide you with the care you need, when you need it.   You may see any of the following providers on your designated Care Team at your next follow up: Dr Arvilla Meres Dr Marca Ancona Dr. Marcos Eke, NP Robbie Lis, [place] [facility] [place], [place] Brynda Peon, NP Karle Plumber, PharmD   Please be sure to bring in all your medications bottles to every appointment.    Thank you for choosing [facility]

## 2023-07-11 NOTE — Progress Notes (Signed)
ADVANCED HF CLINIC NOTE  Date:  07/11/2023   ID:  Sabrina Douglas, [name] [birth year], MRN [id number]  Location: Home  Provider location: [facility] Type of Visit: Established patient  PCP:  Sabrina Douglas, Sabrina Patricia, MD  Cardiologist:  None Primary HF: [place]  Chief Complaint: Heart Failure follow-up   History of Present Illness: Sabrina Douglas is a 82 y.o. female with HTN, HL, anxiety/depression, and ILD due MCTD.    Patient seen for initial consult with Dr. Vassie Douglas on 08/28/18. CXR showed mildly worsened bibasilar pneumonia, suggestive of atypical infection d/t waxing/weaning symptoms. 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  ANA positive, CCP negative. 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    Admitted 2/20 with severe weakness. Fount to have hyponatremia at 114, dysphagia and about 20 pounds weight loss in the months. Treated with 3% saline and eventually demeclocycline.   Pt admitted 3/20 with new onset Afib. Converted back to NSR with amiodarone drip and was discontinued. Toprol resumed. Eliquis started. CT chest in 3/20 no coronary calcification. RHC 3/20 no PAH.   Follows with Dr. Vassie Douglas & Sabrina Douglas. 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 Was on plaquenil/prednisone. Prednisone stopped in 10/21. Switched to Cellcept. Hires CT improving.    C/o chest tightness. Coronary CT ordered but never completed because she did not want to take b-blocker..   12/2020 Dr Sabrina Douglas started on mycophenalate. Volume status doing well.    4/23 Hires CT - no change   Echo 5/23 EF 60-65% RV normal   Underwent placement of Abbott dual chamber PM for bradycardia in 5/23.   Follow up 3/24, continued with persistent, stable CP. Cardiac CT ordered showing no acute findings.   Echo 4/24 showed EF 60-65%, normal RV, mild to moderate TR  Today she returns for HF follow up with her daughter.  Overall feeling fine. She feels palpitations, not bothersome. Started on low dose Toprol by EP last week and feels ok. She has SOB walking further distances on flat ground. Gets fatigued if she does too much around the house. She wears 1.5 L oxygen at night, and uses PRN during the day. Swelling has improved. She has occasional dizziness, no falls or syncope. She has rare atypical chest pain, has improved since starting beta blocker. Denies abnormal bleeding, edema, or PND/Orthopnea. Appetite ok. No fever or chills. Weight at home stable Taking all medications. BP at home 113/73, she checks BP every morning.  Echo today 07/11/23, EF appears hyperdynamic. Personally reviewed with Dr. Elwyn Douglas   Cardiac Studies:  - Echo 4/24 showed EF 60-65%, normal RV, mild to moderate TR  - Echo 5/23 EF 60-65% RV normal   - Zio 10/12/20: 1. Sinus rhythm -  avg HR of 78 bpm.  2. Bundle Branch Block/IVCD was present.  3. 16 runs of SVT occurred, the run with the fastest interval lasting 7 beats with a max rate of 190 bpm, the longest lasting 8 beats with an avg rate of 114 bpm. 4. 708 episode(s) of brief nocturnal AV Block (2nd Mobitz II) occurred,  5. Rare PACs and PVCs 6. Nearly all patient-triggered events associated with sinus rhythm   - HiRes CT 10/20 1. Bilateral lower lobe predominant bronchiectasis and volume loss may be postinfectious or post inflammatory in etiology. Findings are suggestive of an alternative diagnosis (not UIP) per consensus guidelines: Diagnosis of Idiopathic  Pulmonary Fibrosis  - Zio Patch 11/28/18  1. Predominant rhythm was sinus. Patient had a min HR of 54 bpm, max HR of 214 bpm, and avg HR of 96 bpm. 2. Two brief runs of NSVT - longest was 8 beats 3. Forty five brief runs of SVT. The run with the fastest interval lasting 5 beats with a max rate of 214 bpm, the longest lasting 15.7 secs with an avg rate of 154 bpm. 4. Rare PVCs   - RHC 10/15/18: normal pulm pressures, high output  physiology without evidence of intracardiac shunting. RA = 3 RV = 39/4 PA = 39/7 (18) PCW = 4 Fick cardiac output/index = 6.5/3.8 PVR = 2.1 WU Ao sat = 100% PA sat = 81%, 82% High SVC and axillary sats both 81%   - Labs  ANA 1:1280 (pos) ESR 104  RF 55 (pos) RNP > 8.0  (pos) AntiJo negative CCP < 16 Anti-Smith 2.0 (pos) Scl-70 < 1.0 ANCA negative   - HRCT 09/10/18 1. 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 Repeat high-resolution chest CT is recommended in 12 months to assess for temporal changes in the appearance of the lung parenchyma. 2. Aortic atherosclerosis.   - Echo 09/30/18: EF > 65%, normal RV, RA-RV peak gradient increased at 66.9 mmHg consistent with probable severe pulmonary hypertension.    - PFTs  (10/02/18) FEV1 1.13 (66%) FVC   1.23 (56%) DLCO 16%   - VQ scan 10/03/18: Negative   Past Medical History:  Diagnosis Date   Allergy    Anxiety    Arthritis    CHF (congestive heart failure) (HCC)    Depression    Hyperlipidemia    Oxygen deficiency    Past Surgical History:  Procedure Laterality Date   ABDOMINAL HYSTERECTOMY     BALLOON DILATION N/A 10/04/2018   Procedure: BALLOON DILATION;  Surgeon: Kathi Der, MD;  Location: [facility];  Service: Gastroenterology;  Laterality: N/A;   BIOPSY  10/04/2018   Procedure: BIOPSY;  Surgeon: Kathi Der, MD;  Location: [facility];  Service: Gastroenterology;;   CATARACT EXTRACTION     ESOPHAGOGASTRODUODENOSCOPY (EGD) WITH PROPOFOL N/A 10/04/2018   Procedure: ESOPHAGOGASTRODUODENOSCOPY (EGD) WITH PROPOFOL;  Surgeon: Kathi Der, MD;  Location: [facility];  Service: Gastroenterology;  Laterality: N/A;   EYE SURGERY  Feb 2023   PACEMAKER IMPLANT N/A 12/17/2021   Procedure: PACEMAKER IMPLANT;  Surgeon: Regan Lemming, MD;  Location: [facility];  Service:  Cardiovascular;  Laterality: N/A;   RIGHT HEART CATH N/A 10/15/2018   Procedure: RIGHT HEART CATH;  Surgeon: Dolores Patty, MD;  Location: [facility];  Service: Cardiovascular;  Laterality: N/A;   Current Outpatient Medications  Medication Sig Dispense Refill   acetaminophen (TYLENOL) 325 MG tablet Take 325 mg by mouth every 6 (six) hours as needed for moderate pain or headache.     apixaban (ELIQUIS) 5 MG TABS tablet TAKE 1 TABLET(5 MG) BY MOUTH TWICE DAILY 180 tablet 3   cholecalciferol (VITAMIN D3) 25 MCG (1000 UNIT) tablet Take 1 tablet (1,000 Units total) by mouth daily.     fexofenadine (ALLEGRA) 180 MG tablet Take 180 mg by mouth daily as needed for allergies or rhinitis.     furosemide (LASIX) 40 MG tablet TAKE 1 TABLET(40 MG) BY MOUTH DAILY 90 tablet 1   metoprolol succinate (TOPROL XL) 25 MG 24 hr tablet Take 1 tablet (  25 mg total) by mouth daily. 90 tablet 3   Multiple Vitamin (MULTIVITAMIN WITH MINERALS) TABS tablet Take 1 tablet by mouth daily.     mycophenolate (CELLCEPT) 500 MG tablet Take 500 mg by mouth 2 (two) times daily.     nystatin (MYCOSTATIN/NYSTOP) powder Apply 1 Application topically 3 (three) times daily. 60 g 3   OXYGEN Inhale 1.5 L into the lungs See admin instructions. At bedtime  During the day as needed for shortness of breath     pantoprazole (PROTONIX) 40 MG tablet TAKE 1 TABLET(40 MG) BY MOUTH DAILY 90 tablet 1   RESTASIS 0.05 % ophthalmic emulsion Place 1 drop into both eyes 2 (two) times daily. 8:30 am and 6:30 pm     SYSTANE ULTRA 0.4-0.3 % SOLN Apply 1 drop to eye 2 (two) times daily. 12:30 pm and 8 pm     No current facility-administered medications for this encounter.    Allergies:   Pedi-pre tape spray [wound dressing adhesive]   Social History:  The patient  reports that she has never smoked. She has been exposed to tobacco smoke. She has never used smokeless tobacco. She reports that she does not currently use drugs. She reports that  she does not drink alcohol.   Family History:  The patient's family history includes Cancer in her brother and father; Diabetes in her father; Prostate cancer in her father; Sudden death (age of onset: 18) in her mother.   ROS:  Please see the history of present illness.   All other systems are personally reviewed and negative.   Recent Labs: 04/06/2023: ALT 9; BUN 11; Creatinine, Ser 0.74; Hemoglobin 12.8; Platelets 280.0; Potassium 4.0; Sodium 137; TSH 1.68  Personally reviewed   Wt Readings from Last 3 Encounters:  07/11/23 71.4 kg (157 lb 6.4 oz)  06/29/23 72.5 kg (159 lb 12.8 oz)  04/06/23 71.6 kg (157 lb 14.4 oz)   BP (!) 140/80   Pulse 74   Wt 71.4 kg (157 lb 6.4 oz)   SpO2 95%   BMI 27.02 kg/m   Physical Exam General:  NAD. No resp difficulty, walked into clinic with RW, elderly HEENT: Normal Neck: Supple. No JVD. Carotids 2+ bilat; no bruits. No lymphadenopathy or thryomegaly appreciated. Cor: PMI nondisplaced. Regular rate & rhythm. No rubs, gallops or murmurs. Lungs: Clear but diminished throughout Abdomen: Soft, nontender, nondistended. No hepatosplenomegaly. No bruits or masses. Good bowel sounds. Extremities: No cyanosis, clubbing, rash, edema Neuro: Alert & oriented x 3, cranial nerves grossly intact. Moves all 4 extremities w/o difficulty. Affect pleasant.  Device interrogation (personally reviewed): VP 1.6%, < 1 % AF burden, elevated VR since 06/29/23, ~2 hr/day activity  ASSESSMENT AND PLAN: 1. Exertional CP - this has been persistent - we discussed cath versus cardiac CT - Symptoms much-improved with addition of beta blocker  2. 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 Vassie Douglas in Pulmonary - Much improved with steroids. - VQ is negative. - RHC 10/15/18 with normal pulmonary pressures and high output physiology without evidence of intracardiac shunting so not candidate for selective  pulmonary vasodilators  - Now on CellCept per Dr. Dierdre Douglas.  3. PVCs - Zio 3/22 rare PVCs  4. Symptomatic bradycardia with high-grade HB  - s/p Abbott dual chamber PM 5/23 - Has > 90% RV pacing - If becomes symptomatic or EF drops with RV pacing, consider upgrade to CRT - Echo 4/24: EF 60-65%,  normal RV  5. Chronic hypoxic respiratory failure - likely due to ILD & less PAH - Much improved with treatment of MCTD. Wears O2 at night and PRN during day with activity. - Followed closely by Dr Sabrina Douglas. Last hi-res CT 4/23, mild fibrosis present, unchanged from previous  - Continue current regimen - Stable   6. Afib, paroxysmal/PSVT - No AF on Zio 3/22 - CHA2DS2-VASc is 4.  - Continue Eliquis 5 bid. No bleeding - Continue Toprol XL - < 1% AF burden on PM today  - No change.  7. HTN - BP elevated in clinic, but SBP 110s at home. - No change - Recent labs reviewed and are stable, K 4.0, SCr 0.74, hgb 12.8  Follow up in 6 months with Dr. Mickle Plumb, FNP  07/11/2023 11:16 AM  [facility] [address] [facility] [place] [address] [phone] (office) [phone] (fax)

## 2023-07-11 NOTE — Progress Notes (Signed)
Echocardiogram 2D Echocardiogram has been performed.  Sabrina Douglas 07/11/2023, 10:48 AM

## 2023-07-13 NOTE — Progress Notes (Signed)
Remote pacemaker transmission.   

## 2023-07-20 ENCOUNTER — Ambulatory Visit (HOSPITAL_BASED_OUTPATIENT_CLINIC_OR_DEPARTMENT_OTHER): Payer: Medicare PPO | Admitting: Pulmonary Disease

## 2023-07-20 ENCOUNTER — Encounter (HOSPITAL_BASED_OUTPATIENT_CLINIC_OR_DEPARTMENT_OTHER): Payer: Self-pay | Admitting: Pulmonary Disease

## 2023-07-20 VITALS — BP 124/78 | HR 77 | Ht 64.0 in | Wt 157.0 lb

## 2023-07-20 DIAGNOSIS — J849 Interstitial pulmonary disease, unspecified: Secondary | ICD-10-CM

## 2023-07-20 DIAGNOSIS — J9611 Chronic respiratory failure with hypoxia: Secondary | ICD-10-CM

## 2023-07-20 NOTE — Progress Notes (Signed)
Subjective:    Patient ID: Sabrina Douglas, female    DOB: 1940-12-07, 82 y.o.   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    Sees Dr. 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 Fib Seen by ENT for dysphagia  4 month FU visit Pt insurance switch DME companies from [employer] to [employer]  She will need to be requalified for oxygen.   Discussed the use of AI scribe software for clinical note transcription with the patient, who gave verbal consent to proceed.  History of Present Illness   The patient, with a history of Raynaud's disease, skin tenderness, and atrial fibrillation managed with a pacemaker and metoprolol, presents with persistent skin tenderness and itching. The patient reports that these symptoms are present all over her body and cause significant discomfort. The patient also reports a recent diagnosis of Afib, which was discovered during a follow-up visit with the doctor who installed her pacemaker. The patient was put back on metoprolol for this condition.  The patient also has a history of lung disease and is on oxygen therapy. She reports that she still experiences shortness of breath and chest pain during physical exertion, such as walking to the mailbox or moving around the house. The patient uses oxygen therapy during these episodes to alleviate her symptoms. The patient's oxygen provider was recently changed due to insurance reasons, and there is some concern about the future availability of this therapy.       ON ambulation, she did not desaturate  Significant tests/ events reviewed   PFTs 04/2020 -mild restriction, ratio 85, FEV1 92%, FVC 84%,  TLC 65%, DLCO 62% >> much improved compared to prior     ANA positive 1: 1280, Sm Ab 2.0, RNP > 8.0 ,CCP negative    10/02/2018 PFTs ratio 92, moderate to severe restriction FEV1 66%, FVC 56%, TLC 46%, DLCO 17% corrects to 51% for alveolar volume   Echo 05/2021 normal RVSP Echo 09/2018 RVSP 67 Cath 10/2018 RA 3, PA 39/7 , CI 3.8 PVR2.1 Wu >> normal pulmonary pressures, high-output physiology     HRCT chest 11/2022 unchanged fibrosis "alternate" pattern HRCT 11/2020 Basilar predominant bronchiectasis, ground-glass and volume loss,progressive from 05/22/2019 " alternative " (not UIP ) ? Fibrotic NSIP   HRCT 05/2019 Bilateral lower lobe predominant bronchiectasis and volume loss may be postinfectious or post inflammatory in etiology HRCT 09/10/2018 -probable UIP   Esophagram 09/2018 smooth narrowing of distal esophagus?  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 Also denies presyncope, palpitations, heartburn, abdominal pain, nausea, vomiting, diarrhea or change in bowel or urinary habits, dysuria,hematuria, rash, arthralgias, visual complaints, headache, numbness weakness or ataxia.     Objective:   Physical Exam  Gen. 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 Reports dyspnea and chest pain with exertion. Oxygen levels do not meet criteria for continued insurance coverage. Discussed potential use of oxygen as a crutch and the possibility of purchasing equipment if needed. Explained that oxygen levels must drop below 88% to qualify for insurance coverage. Discussed the option of monitoring oxygen levels at night to determine if criteria are met. - Consider nocturnal oxygen levels to determine eligibility for oxygen therapy - Continue current oxygen equipment as needed - Consider purchasing used oxygen equipment if necessary - Schedule HRCT chest in April/may  to monitor scar tissue  Raynaud's Phenomenon Chronic condition characterized by numbness and pain in the fingertips, exacerbated by cold weather. Symptoms include tenderness, itching, and soreness in the skin. - Continue current management - Monitor symptoms and report significant changes  Atrial Fibrillation Managed with metoprolol. Echocardiogram performed recently. Symptoms are well-controlled. Discussed low risk of adverse events with metoprolol and importance of cardiology follow-up. - Continue metoprolol as prescribed - Follow up with cardiologist as needed  General Health Maintenance Regular follow-ups and scans planned to monitor health status. - Schedule follow-up appointment in February for regular checkup - Perform lung scan in April to monitor scar tissue.

## 2023-07-20 NOTE — Patient Instructions (Signed)
X High res CT chest in may 2025

## 2023-07-24 ENCOUNTER — Encounter: Payer: Self-pay | Admitting: Cardiology

## 2023-08-03 ENCOUNTER — Telehealth: Payer: Self-pay

## 2023-08-03 NOTE — Telephone Encounter (Signed)
Outreach made.  Spoke with daughter per DPR.  Daughter states at time of episode there was some "family drama".  Pt and family were in kitchen cleaning up after Christmas dinner.  Daughter will discuss with Pt if she had any symptoms.  If no symptoms advised ok to continue to monitor.  Direct number given to device clinic to call if any symptoms noted.

## 2023-08-03 NOTE — Telephone Encounter (Signed)
Alert received from [employer]:  Alert remote transmission: HVR There was a HVR detected that was 3 minutes and 1 second at a rate of 179 bpm , this appears to be supraventricular in origin, sent to triage.

## 2023-08-17 ENCOUNTER — Encounter: Payer: Self-pay | Admitting: Internal Medicine

## 2023-08-22 ENCOUNTER — Telehealth: Payer: Self-pay | Admitting: Internal Medicine

## 2023-08-22 NOTE — Telephone Encounter (Signed)
 Patient dropped off document Handicap Placard, to be filled out by provider. Patient requested to send it back via Call Patient to pick up within 7-days. Document is located in providers tray at front office.Please advise at cell phone [phone].

## 2023-08-23 NOTE — Telephone Encounter (Signed)
 Form is ready to be picked up and the daughter is aware.

## 2023-08-29 ENCOUNTER — Other Ambulatory Visit: Payer: Self-pay | Admitting: Internal Medicine

## 2023-08-29 DIAGNOSIS — K219 Gastro-esophageal reflux disease without esophagitis: Secondary | ICD-10-CM

## 2023-09-18 ENCOUNTER — Ambulatory Visit (INDEPENDENT_AMBULATORY_CARE_PROVIDER_SITE_OTHER): Payer: Medicare PPO

## 2023-09-18 DIAGNOSIS — I442 Atrioventricular block, complete: Secondary | ICD-10-CM | POA: Diagnosis not present

## 2023-09-19 LAB — CUP PACEART REMOTE DEVICE CHECK
Battery Remaining Longevity: 96 mo
Battery Remaining Percentage: 74 %
Battery Voltage: 2.99 V
Brady Statistic AP VP Percent: 1.7 %
Brady Statistic AP VS Percent: 23 %
Brady Statistic AS VP Percent: 2.3 %
Brady Statistic AS VS Percent: 73 %
Brady Statistic RA Percent Paced: 24 %
Brady Statistic RV Percent Paced: 4 %
Date Time Interrogation Session: 20250210020013
Implantable Lead Connection Status: 753985
Implantable Lead Connection Status: 753985
Implantable Lead Implant Date: 20230512
Implantable Lead Implant Date: 20230512
Implantable Lead Location: 753859
Implantable Lead Location: 753860
Implantable Pulse Generator Implant Date: 20230512
Lead Channel Impedance Value: 430 Ohm
Lead Channel Impedance Value: 430 Ohm
Lead Channel Pacing Threshold Amplitude: 0.5 V
Lead Channel Pacing Threshold Amplitude: 0.625 V
Lead Channel Pacing Threshold Pulse Width: 0.4 ms
Lead Channel Pacing Threshold Pulse Width: 0.4 ms
Lead Channel Sensing Intrinsic Amplitude: 12 mV
Lead Channel Sensing Intrinsic Amplitude: 2.1 mV
Lead Channel Setting Pacing Amplitude: 0.875
Lead Channel Setting Pacing Amplitude: 1.5 V
Lead Channel Setting Pacing Pulse Width: 0.4 ms
Lead Channel Setting Sensing Sensitivity: 2 mV
Pulse Gen Model: 2272
Pulse Gen Serial Number: 8063350

## 2023-09-20 ENCOUNTER — Telehealth: Payer: Self-pay

## 2023-09-20 ENCOUNTER — Ambulatory Visit (HOSPITAL_BASED_OUTPATIENT_CLINIC_OR_DEPARTMENT_OTHER): Payer: Medicare PPO | Admitting: Pulmonary Disease

## 2023-09-20 NOTE — Telephone Encounter (Signed)
I let the pt daughter know that she has plenty of battery life left.

## 2023-09-21 DIAGNOSIS — J849 Interstitial pulmonary disease, unspecified: Secondary | ICD-10-CM | POA: Diagnosis not present

## 2023-09-21 DIAGNOSIS — I7300 Raynaud's syndrome without gangrene: Secondary | ICD-10-CM | POA: Diagnosis not present

## 2023-09-21 DIAGNOSIS — G629 Polyneuropathy, unspecified: Secondary | ICD-10-CM | POA: Diagnosis not present

## 2023-09-21 DIAGNOSIS — I272 Pulmonary hypertension, unspecified: Secondary | ICD-10-CM | POA: Diagnosis not present

## 2023-09-21 DIAGNOSIS — E663 Overweight: Secondary | ICD-10-CM | POA: Diagnosis not present

## 2023-09-21 DIAGNOSIS — Z6826 Body mass index (BMI) 26.0-26.9, adult: Secondary | ICD-10-CM | POA: Diagnosis not present

## 2023-09-21 DIAGNOSIS — I4891 Unspecified atrial fibrillation: Secondary | ICD-10-CM | POA: Diagnosis not present

## 2023-09-21 DIAGNOSIS — M351 Other overlap syndromes: Secondary | ICD-10-CM | POA: Diagnosis not present

## 2023-09-26 ENCOUNTER — Encounter: Payer: Self-pay | Admitting: Internal Medicine

## 2023-09-26 ENCOUNTER — Other Ambulatory Visit (INDEPENDENT_AMBULATORY_CARE_PROVIDER_SITE_OTHER): Payer: Medicare PPO

## 2023-09-26 DIAGNOSIS — R7302 Impaired glucose tolerance (oral): Secondary | ICD-10-CM | POA: Diagnosis not present

## 2023-09-26 LAB — HEMOGLOBIN A1C: Hgb A1c MFr Bld: 6.1 % (ref 4.6–6.5)

## 2023-10-25 NOTE — Progress Notes (Signed)
 Remote pacemaker transmission.

## 2023-10-25 NOTE — Addendum Note (Signed)
 Addended by: Geralyn Flash D on: 10/25/2023 11:17 AM   Modules accepted: Orders

## 2023-12-11 ENCOUNTER — Encounter (HOSPITAL_BASED_OUTPATIENT_CLINIC_OR_DEPARTMENT_OTHER): Payer: Self-pay

## 2023-12-11 ENCOUNTER — Encounter (HOSPITAL_BASED_OUTPATIENT_CLINIC_OR_DEPARTMENT_OTHER): Payer: Self-pay | Admitting: Pulmonary Disease

## 2023-12-13 ENCOUNTER — Ambulatory Visit (HOSPITAL_BASED_OUTPATIENT_CLINIC_OR_DEPARTMENT_OTHER)
Admission: RE | Admit: 2023-12-13 | Discharge: 2023-12-13 | Disposition: A | Discharge status: Home / Self Care | Source: Ambulatory Visit | Attending: Pulmonary Disease | Admitting: Pulmonary Disease

## 2023-12-13 DIAGNOSIS — R918 Other nonspecific abnormal finding of lung field: Secondary | ICD-10-CM | POA: Diagnosis not present

## 2023-12-13 DIAGNOSIS — J849 Interstitial pulmonary disease, unspecified: Secondary | ICD-10-CM | POA: Insufficient documentation

## 2023-12-17 NOTE — Progress Notes (Unsigned)
  Electrophysiology Office Note:   Date:  12/19/2023  ID:  Sabrina Douglas, Sabrina Douglas 20-Aug-1940, MRN [id number]  Primary Cardiologist: None Primary Heart Failure: None Electrophysiologist: Will Cortland Ding, MD       History of Present Illness:   Sabrina Douglas is a 83 y.o. female with h/o paroxysmal AF, CHB s/p PPM, PVC's, HTN, HLD, ILD  seen today for routine electrophysiology followup.   Episodes of HVR's in Nov, Dec 2024 after "family drama".  Low dose beta blocker added at last visit in 06/2023.  Since last being seen in our clinic the patient reports doing well overall. She worries about her blood pressure > brings in sheets of paper with extensive records that show excellent control. She has not had any issues with tolerance of beta blocker.   She denies chest pain, palpitations, dyspnea, PND, orthopnea, nausea, vomiting, dizziness, syncope, edema, weight gain, or early satiety.   Review of systems complete and found to be negative unless listed in HPI.   EP Information / Studies Reviewed:    EKG is ordered today. Personal review as below.  EKG Interpretation Date/Time:  Tuesday Dec 19 2023 14:16:29 EDT Ventricular Rate:  75 PR Interval:  170 QRS Duration:  140 QT Interval:  392 QTC Calculation: 437 R Axis:   -20  Text Interpretation: Normal sinus rhythm Right bundle branch block Confirmed by Creighton Doffing ([id number]) on 12/19/2023 2:25:34 PM   PPM Interrogation-  reviewed in detail today,  See PACEART report.  Device History: Abbott Dual Chamber PPM implanted 12/17/21 for intermittent CHB  Studies:  ECHO 12/2021 LVEF 60-65%, no RWMA, indeterminate diastolic parameters, moderate mitral valve regurgitation    Arrhythmia / AAD CHB s/p PPM Paroxysmal AF   Risk Assessment/Calculations:    CHA2DS2-VASc Score = 4   This indicates a 4.8% annual risk of stroke. The patient's score is based upon: CHF History: 0 HTN History: 1 Diabetes History: 0 Stroke History:  0 Vascular Disease History: 0 Age Score: 2 Gender Score: 1             Physical Exam:   VS:  BP 134/72   Pulse 75   Ht 5\' 4"  (1.626 m)   Wt 157 lb (71.2 kg)   SpO2 98%   BMI 26.95 kg/m    Wt Readings from Last 3 Encounters:  12/19/23 157 lb (71.2 kg)  07/20/23 157 lb (71.2 kg)  07/11/23 157 lb 6.4 oz (71.4 kg)     GEN: Well nourished, well developed in no acute distress NECK: No JVD; No carotid bruits CARDIAC: Regular rate and rhythm, no murmurs, rubs, gallops RESPIRATORY:  Clear to auscultation without rales, wheezing or rhonchi  ABDOMEN: Soft, non-tender, non-distended EXTREMITIES:  No edema; No deformity   ASSESSMENT AND PLAN:    CHB s/p Abbott PPM  Intermittent CHB, has conduction on exam 12/19/23 -Normal PPM function -See Pace Art report -VIP turned on   Paroxysmal Atrial Fibrillation  CHA2DS2-VASc 4 -<1% burden on device  -OAC for stroke prophylaxis  -continue Toprol  25 mg > no issues on BB (prior question of tolerance)  Secondary Hypercoagulable State  -continue Eliquis  5mg  BID, dose reviewed and appropriate by wt / Cr  -prior Hgb reviewed, trend stable  MCTD / Scleroderma with ILD  -follows with Dr. Villa Greaser, Dr. Ebbie Goldmann, Dr. Julane Ny   Disposition:   Follow up with Dr. Lawana Pray in 12 months  Signed, Creighton Doffing, NP-C, AGACNP-BC [facility] - Electrophysiology  12/19/2023, 4:34 PM

## 2023-12-18 ENCOUNTER — Ambulatory Visit (INDEPENDENT_AMBULATORY_CARE_PROVIDER_SITE_OTHER): Payer: Medicare PPO

## 2023-12-18 ENCOUNTER — Encounter (HOSPITAL_BASED_OUTPATIENT_CLINIC_OR_DEPARTMENT_OTHER): Payer: Self-pay | Admitting: Pulmonary Disease

## 2023-12-18 ENCOUNTER — Encounter: Payer: Medicare PPO | Admitting: Pulmonary Disease

## 2023-12-18 ENCOUNTER — Encounter (HOSPITAL_BASED_OUTPATIENT_CLINIC_OR_DEPARTMENT_OTHER): Payer: Self-pay

## 2023-12-18 DIAGNOSIS — I442 Atrioventricular block, complete: Secondary | ICD-10-CM

## 2023-12-18 LAB — CUP PACEART REMOTE DEVICE CHECK
Battery Remaining Longevity: 92 mo
Battery Remaining Percentage: 72 %
Battery Voltage: 2.99 V
Brady Statistic AP VP Percent: 3 %
Brady Statistic AP VS Percent: 20 %
Brady Statistic AS VP Percent: 3.6 %
Brady Statistic AS VS Percent: 73 %
Brady Statistic RA Percent Paced: 23 %
Brady Statistic RV Percent Paced: 6.6 %
Date Time Interrogation Session: 20250511040013
Implantable Lead Connection Status: 753985
Implantable Lead Connection Status: 753985
Implantable Lead Implant Date: 20230512
Implantable Lead Implant Date: 20230512
Implantable Lead Location: 753859
Implantable Lead Location: 753860
Implantable Pulse Generator Implant Date: 20230512
Lead Channel Impedance Value: 410 Ohm
Lead Channel Impedance Value: 430 Ohm
Lead Channel Pacing Threshold Amplitude: 0.5 V
Lead Channel Pacing Threshold Amplitude: 0.75 V
Lead Channel Pacing Threshold Pulse Width: 0.4 ms
Lead Channel Pacing Threshold Pulse Width: 0.4 ms
Lead Channel Sensing Intrinsic Amplitude: 12 mV
Lead Channel Sensing Intrinsic Amplitude: 2 mV
Lead Channel Setting Pacing Amplitude: 1 V
Lead Channel Setting Pacing Amplitude: 1.5 V
Lead Channel Setting Pacing Pulse Width: 0.4 ms
Lead Channel Setting Sensing Sensitivity: 2 mV
Pulse Gen Model: 2272
Pulse Gen Serial Number: 8063350

## 2023-12-19 ENCOUNTER — Encounter: Payer: Self-pay | Admitting: Pulmonary Disease

## 2023-12-19 ENCOUNTER — Ambulatory Visit: Attending: Pulmonary Disease | Admitting: Pulmonary Disease

## 2023-12-19 VITALS — BP 134/72 | HR 75 | Ht 64.0 in | Wt 157.0 lb

## 2023-12-19 DIAGNOSIS — M351 Other overlap syndromes: Secondary | ICD-10-CM

## 2023-12-19 DIAGNOSIS — D6869 Other thrombophilia: Secondary | ICD-10-CM | POA: Diagnosis not present

## 2023-12-19 DIAGNOSIS — I442 Atrioventricular block, complete: Secondary | ICD-10-CM

## 2023-12-19 DIAGNOSIS — Z950 Presence of cardiac pacemaker: Secondary | ICD-10-CM

## 2023-12-19 DIAGNOSIS — R000 Tachycardia, unspecified: Secondary | ICD-10-CM

## 2023-12-19 DIAGNOSIS — I480 Paroxysmal atrial fibrillation: Secondary | ICD-10-CM

## 2023-12-19 LAB — CUP PACEART INCLINIC DEVICE CHECK
Battery Remaining Longevity: 96 mo
Battery Voltage: 2.99 V
Brady Statistic RA Percent Paced: 23 %
Brady Statistic RV Percent Paced: 6.5 %
Date Time Interrogation Session: 20250513162536
Implantable Lead Connection Status: 753985
Implantable Lead Connection Status: 753985
Implantable Lead Implant Date: 20230512
Implantable Lead Implant Date: 20230512
Implantable Lead Location: 753859
Implantable Lead Location: 753860
Implantable Pulse Generator Implant Date: 20230512
Lead Channel Impedance Value: 425 Ohm
Lead Channel Impedance Value: 425 Ohm
Lead Channel Pacing Threshold Amplitude: 0.5 V
Lead Channel Pacing Threshold Amplitude: 0.625 V
Lead Channel Pacing Threshold Pulse Width: 0.4 ms
Lead Channel Pacing Threshold Pulse Width: 0.4 ms
Lead Channel Sensing Intrinsic Amplitude: 12 mV
Lead Channel Sensing Intrinsic Amplitude: 2.3 mV
Lead Channel Setting Pacing Amplitude: 0.875
Lead Channel Setting Pacing Amplitude: 1.5 V
Lead Channel Setting Pacing Pulse Width: 0.4 ms
Lead Channel Setting Sensing Sensitivity: 2 mV
Pulse Gen Model: 2272
Pulse Gen Serial Number: 8063350

## 2023-12-19 NOTE — Patient Instructions (Signed)
 Medication Instructions:  Your physician recommends that you continue on your current medications as directed. Please refer to the Current Medication list given to you today.  *If you need a refill on your cardiac medications before your next appointment, please call your pharmacy*  Lab Work: None ordered If you have labs (blood work) drawn today and your tests are completely normal, you will receive your results only by: MyChart Message (if you have MyChart) OR A paper copy in the mail If you have any lab test that is abnormal or we need to change your treatment, we will call you to review the results.  Follow-Up: At [facility], you and your health needs are our priority.  As part of our continuing mission to provide you with exceptional heart care, our providers are all part of one team.  This team includes your primary Cardiologist (physician) and Advanced Practice Providers or APPs (Physician Assistants and Nurse Practitioners) who all work together to provide you with the care you need, when you need it.  Your next appointment:   1 year(s)  Provider:   Agatha Horsfall, MD

## 2023-12-20 ENCOUNTER — Ambulatory Visit: Payer: Self-pay | Admitting: Cardiology

## 2023-12-27 DIAGNOSIS — J849 Interstitial pulmonary disease, unspecified: Secondary | ICD-10-CM | POA: Diagnosis not present

## 2023-12-27 DIAGNOSIS — I7300 Raynaud's syndrome without gangrene: Secondary | ICD-10-CM | POA: Diagnosis not present

## 2023-12-27 DIAGNOSIS — E663 Overweight: Secondary | ICD-10-CM | POA: Diagnosis not present

## 2023-12-27 DIAGNOSIS — Z6825 Body mass index (BMI) 25.0-25.9, adult: Secondary | ICD-10-CM | POA: Diagnosis not present

## 2023-12-27 DIAGNOSIS — G629 Polyneuropathy, unspecified: Secondary | ICD-10-CM | POA: Diagnosis not present

## 2023-12-27 DIAGNOSIS — M351 Other overlap syndromes: Secondary | ICD-10-CM | POA: Diagnosis not present

## 2024-01-15 ENCOUNTER — Ambulatory Visit (HOSPITAL_BASED_OUTPATIENT_CLINIC_OR_DEPARTMENT_OTHER): Admitting: Nurse Practitioner

## 2024-01-15 ENCOUNTER — Encounter (HOSPITAL_BASED_OUTPATIENT_CLINIC_OR_DEPARTMENT_OTHER): Payer: Self-pay | Admitting: Nurse Practitioner

## 2024-01-15 VITALS — BP 132/60 | HR 80 | Ht 64.0 in | Wt 153.8 lb

## 2024-01-15 DIAGNOSIS — J302 Other seasonal allergic rhinitis: Secondary | ICD-10-CM

## 2024-01-15 DIAGNOSIS — J849 Interstitial pulmonary disease, unspecified: Secondary | ICD-10-CM | POA: Diagnosis not present

## 2024-01-15 DIAGNOSIS — I4891 Unspecified atrial fibrillation: Secondary | ICD-10-CM | POA: Diagnosis not present

## 2024-01-15 DIAGNOSIS — J9611 Chronic respiratory failure with hypoxia: Secondary | ICD-10-CM | POA: Diagnosis not present

## 2024-01-15 DIAGNOSIS — J309 Allergic rhinitis, unspecified: Secondary | ICD-10-CM | POA: Insufficient documentation

## 2024-01-15 DIAGNOSIS — M351 Other overlap syndromes: Secondary | ICD-10-CM | POA: Diagnosis not present

## 2024-01-15 MED ORDER — FLUTICASONE PROPIONATE 50 MCG/ACT NA SUSP
1.0000 | Freq: Every day | NASAL | 2 refills | Status: AC
Start: 2024-01-15 — End: ?

## 2024-01-15 NOTE — Patient Instructions (Signed)
 CT chest was stable We may repeat in a year for monitoring or just continue to monitor your symptoms  Overnight oxygen  study to see if you need to restart oxygen  at night. Someone should contact you in the next 2-3 weeks to schedule this. Call us  if not  Continue allegra daily Try flonase  nasal spray 1-2 sprays each nostril daily as needed for postnasal drainage/allergies   Follow up in 6 months with Dr. Villa Greaser. If symptoms do not improve or worsen, please contact office for sooner follow up or seek emergency care.

## 2024-01-15 NOTE — Progress Notes (Signed)
 @Patient  ID: Sabrina Douglas, female    DOB: 08-Feb-1941, 83 y.o.   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 She is a patient of Dr. Hortense Lyons and last seen in office 07/20/2023. Past medical history significant for PH, PAF on Eliquis , complete heart block s/p pacemaker. Followed by Dr. Ebbie Goldmann with rheumatology.  TEST/EVENTS:  Serologies: positive ANA 1:1280, Sm Ab 2, RNP > 8.0, CCP negative 04/2020 PFT: mild restriction, ratio 85, fEV1 92%, FVC 85%, TLC 65%, DLCO 62%  04/2023 HRCT chest: unchanged fibrosis, alternate pattern.  12/13/2023 HRCT chest: atherosclerosis. Heart enlarged. ILD, stable; alternate pattern. Calcified granulomas. Stable scattered nodules. Left hemidiaphragm slightly elevated  07/20/2023: OV with Dr. Villa Greaser. Insurance required switch from [employer] [place] to [employer]. Needs to be requalified for O2. No exertional hypoxia. Baseline SOB and CP with exertion, walking to the mailbox or moving aroudn the house. Oxygen  does seem to help. HRCT ordered in 6 months for monitoring. ONO to evaluate for oxygen  need.  01/15/2024: Today - follow up Patient presents today for follow-up with her daughter.  She had high-resolution CT chest that showed stable changes, not UIP.  No evidence of progression since 2023.  She feels unchanged compared to her last visit.  Has baseline dyspnea but is able to complete things at a slower pace.  She does have some increased postnasal drainage and throat clearing over the last couple days.  Has not taken her Allegra today.  This tends to help.  No significant cough, chest congestion or wheezing.  No fevers or chills. Never had overnight oxygen  study after her last visit.  No longer has any supplemental oxygen  at home.  Allergies  Allergen Reactions   Pedi-Pre Tape Spray [Wound  Dressing Adhesive] Other (See Comments)    Blisters and bruisers    Immunization History  Administered Date(s) Administered   Fluad Quad(high Dose 65+) 05/02/2019   Fluad Trivalent(High Dose 65+) 04/06/2023   Influenza, High Dose Seasonal PF 10/10/2018, 05/19/2020, 05/22/2022   Influenza-Unspecified 05/25/2021   Moderna SARS-COV2 Booster Vaccination 07/11/2020   Moderna Sars-Covid-2 Vaccination 09/23/2019, 10/21/2019, 04/20/2021   Pneumococcal Conjugate-13 12/31/2019   Pneumococcal Polysaccharide-23 10/10/2018   Rsv, Bivalent, Protein Subunit Rsvpref,pf Pattricia Bores) 07/21/2022    Past Medical History:  Diagnosis Date   Allergy    Anxiety    Arthritis    CHF (congestive heart failure) (HCC)    Depression    Hyperlipidemia    Oxygen  deficiency     Tobacco History: Social History   Tobacco Use  Smoking Status Never   Passive exposure: Yes  Smokeless Tobacco Never   Counseling given: Not Answered   Outpatient Medications Prior to Visit  Medication Sig Dispense Refill   acetaminophen  (TYLENOL ) 325 MG tablet Take 325 mg by mouth every 6 (six) hours as needed for moderate pain or headache.     apixaban  (ELIQUIS ) 5 MG TABS tablet TAKE 1 TABLET(5 MG) BY MOUTH TWICE DAILY 180 tablet 3   cholecalciferol (VITAMIN D3) 25 MCG (1000 UNIT) tablet Take 1 tablet (1,000 Units total) by mouth daily.     fexofenadine (ALLEGRA) 180 MG tablet Take 180 mg by mouth daily as needed for allergies or rhinitis.     furosemide  (LASIX ) 40 MG tablet TAKE 1 TABLET(40 MG) BY MOUTH DAILY 90 tablet 1  metoprolol  succinate (TOPROL  XL) 25 MG 24 hr tablet Take 1 tablet (25 mg total) by mouth daily. 90 tablet 3   Multiple Vitamin (MULTIVITAMIN WITH MINERALS) TABS tablet Take 1 tablet by mouth daily.     mycophenolate  (CELLCEPT ) 500 MG tablet Take 500 mg by mouth 2 (two) times daily.     nystatin  (MYCOSTATIN /NYSTOP ) powder Apply 1 Application topically 3 (three) times daily. 60 g 3   pantoprazole  (PROTONIX )  40 MG tablet TAKE 1 TABLET(40 MG) BY MOUTH DAILY 90 tablet 1   RESTASIS 0.05 % ophthalmic emulsion Place 1 drop into both eyes 2 (two) times daily. 8:30 am and 6:30 pm     SYSTANE ULTRA 0.4-0.3 % SOLN Apply 1 drop to eye 2 (two) times daily. 12:30 pm and 8 pm     OXYGEN  Inhale 1.5 L into the lungs See admin instructions. At bedtime  During the day as needed for shortness of breath (Patient not taking: Reported on 01/15/2024)     No facility-administered medications prior to visit.     Review of Systems:   Constitutional: No weight loss or gain, night sweats, fevers, chills, or lassitude. +baseline fatigue  HEENT: No headaches, tooth/dental problems, or sore throat. No sneezing, itching, ear ache +nasal congestion, post nasal drip, throat clearing  CV:  No chest pain, orthopnea, PND, swelling in lower extremities, anasarca, dizziness, palpitations, syncope Resp: +baseline shortness of breath with exertion. No excess mucus or change in color of mucus. No productive or non-productive. No hemoptysis. No wheezing.  No chest wall deformity GI:  No heartburn, indigestion GU: No dysuria, change in color of urine, urgency or frequency.  No flank pain, no hematuria  Skin: No rash, lesions, ulcerations MSK:  +chronic joint pains  Neuro: No dizziness or lightheadedness.  Psych: No depression or anxiety. Mood stable.     Physical Exam:  BP 132/60   Pulse 80   Ht 5\' 4"  (1.626 m)   Wt 153 lb 12.8 oz (69.8 kg)   SpO2 100%   BMI 26.40 kg/m   GEN: Pleasant, interactive, well-kempt; in no acute distress HEENT:  Normocephalic and atraumatic. PERRLA. Sclera white. Nasal turbinates erythematous, moist and patent bilaterally. No rhinorrhea present. Oropharynx pink and moist, without exudate or edema. No lesions, ulcerations NECK:  Supple w/ fair ROM. No JVD present. Thyroid  symmetrical with no goiter or nodules palpated. No lymphadenopathy.   CV: RRR, no m/r/g, no peripheral edema. Pulses intact, +2  bilaterally. No cyanosis, pallor or clubbing. PULMONARY:  Unlabored, regular breathing. Clear bilaterally A&P w/o wheezes/rales/rhonchi. No accessory muscle use.  GI: BS present and normoactive. Soft, non-tender to palpation. No organomegaly or masses detected.  MSK: No erythema, warmth or tenderness. Cap refil <2 sec all extrem. No deformities or joint swelling noted.  Neuro: A/Ox3. No focal deficits noted.   Skin: Warm, no lesions or rashe Psych: Normal affect and behavior. Judgement and thought content appropriate.     Lab Results:  CBC    Component Value Date/Time   WBC 3.4 (L) 04/06/2023 0947   RBC 4.25 04/06/2023 0947   HGB 12.8 04/06/2023 0947   HCT 40.0 04/06/2023 0947   PLT 280.0 04/06/2023 0947   MCV 94.1 04/06/2023 0947   MCH 30.4 12/18/2021 0320   MCHC 31.9 04/06/2023 0947   RDW 12.6 04/06/2023 0947   LYMPHSABS 0.8 04/06/2023 0947   MONOABS 0.5 04/06/2023 0947   EOSABS 0.1 04/06/2023 0947   BASOSABS 0.0 04/06/2023 0947    BMET  Component Value Date/Time   NA 137 04/06/2023 0947   NA 138 10/31/2018 0000   K 4.0 04/06/2023 0947   CL 99 04/06/2023 0947   CO2 30 04/06/2023 0947   GLUCOSE 85 04/06/2023 0947   BUN 11 04/06/2023 0947   BUN 14 10/31/2018 0000   CREATININE 0.74 04/06/2023 0947   CREATININE 0.89 (H) 03/25/2020 0925   CALCIUM 9.8 04/06/2023 0947   GFRNONAA 56 (L) 12/18/2021 0320   GFRAA >60 12/12/2019 1228    BNP    Component Value Date/Time   BNP 162.4 (H) 12/16/2021 2032     Imaging:  CUP PACEART INCLINIC DEVICE CHECK Result Date: 12/19/2023 Normal in-clinic dual chamber pacemaker check. Presenting Rhythm: ASVP. Routine testing of thresholds, sensing, and impedance demonstrate stable parameters and no programming changes needed at this time. Reduced burden of AMS episodes compared to last review, <1%, longest episode 1 min 44 sec (pt on Eliquis ). Estimated longevity 7.4-8 years. With industry support, VIP was turned on. Intrinsic AVD 180  ms.  Pt enrolled in remote follow-up. bo  CUP PACEART REMOTE DEVICE CHECK Result Date: 12/18/2023 PPM Scheduled remote reviewed. Normal device function.  Presenting rhythm:  AP/VP Hx of PAF, controlled rates, longest durtaion 1hr , Eliquis  per PA report 1 HVR, duration 53sec, HR 187, atrial driven tachycardia Next remote 91 days. LA, CVRS   Administration History     None          Latest Ref Rng & Units 04/24/2020   11:00 AM 10/02/2018    5:32 PM  PFT Results  FVC-Pre L 1.80  1.23   FVC-Predicted Pre % 84  56   FVC-Post L 1.70  1.14   FVC-Predicted Post % 79  52   Pre FEV1/FVC % % 85  92   Post FEV1/FCV % % 87  92   FEV1-Pre L 1.53  1.13   FEV1-Predicted Pre % 92  66   FEV1-Post L 1.48  1.05   DLCO uncorrected ml/min/mmHg 12.06  3.35   DLCO UNC% % 62  17   DLCO corrected ml/min/mmHg 12.06  3.25   DLCO COR %Predicted % 62  16   DLVA Predicted % 110  51   TLC L 3.41  2.42   TLC % Predicted % 65  46   RV % Predicted % 60  51     No results found for: "NITRICOXIDE"      Assessment & Plan:   ILD (interstitial lung disease) (HCC) MCTD ILD; stable. No evidence of progression clinically or radiographic. Continue to monitor. Consider repeat annually, pending symptoms. Encouraged to work on graded exercises. Continue immunosuppression as recommend by rheum.  Patient Instructions  CT chest was stable We may repeat in a year for monitoring or just continue to monitor your symptoms  Overnight oxygen  study to see if you need to restart oxygen  at night. Someone should contact you in the next 2-3 weeks to schedule this. Call us  if not  Continue allegra daily Try flonase  nasal spray 1-2 sprays each nostril daily as needed for postnasal drainage/allergies   Follow up in 6 months with Dr. Villa Greaser. If symptoms do not improve or worsen, please contact office for sooner follow up or seek emergency care.    Chronic respiratory failure with hypoxia (HCC) No exertional  hypoxia on prior walk test. Will repeat ONO on room air to assess for nocturnal hypoxia. Goal >88-90%  Atrial fibrillation with RVR (HCC) Rate controlled and appears to be  SR today. On chronic AC. Follow up with cardiology as scheduled  Allergic rhinitis Add on intranasal steroid with flonase . Continue allegra   Advised if symptoms do not improve or worsen, to please contact office for sooner follow up or seek emergency care.   I spent 35 minutes of dedicated to the care of this patient on the date of this encounter to include pre-visit review of records, face-to-face time with the patient discussing conditions above, post visit ordering of testing, clinical documentation with the electronic health record, making appropriate referrals as documented, and communicating necessary findings to members of the patients care team.  Roetta Clarke, NP 01/15/2024  Pt aware and understands NP's role.

## 2024-01-15 NOTE — Assessment & Plan Note (Signed)
 No exertional hypoxia on prior walk test. Will repeat ONO on room air to assess for nocturnal hypoxia. Goal >88-90%

## 2024-01-15 NOTE — Assessment & Plan Note (Signed)
 MCTD ILD; stable. No evidence of progression clinically or radiographic. Continue to monitor. Consider repeat annually, pending symptoms. Encouraged to work on graded exercises. Continue immunosuppression as recommend by rheum.  Patient Instructions  CT chest was stable We may repeat in a year for monitoring or just continue to monitor your symptoms  Overnight oxygen  study to see if you need to restart oxygen  at night. Someone should contact you in the next 2-3 weeks to schedule this. Call us  if not  Continue allegra daily Try flonase  nasal spray 1-2 sprays each nostril daily as needed for postnasal drainage/allergies   Follow up in 6 months with Dr. Villa Greaser. If symptoms do not improve or worsen, please contact office for sooner follow up or seek emergency care.

## 2024-01-15 NOTE — Assessment & Plan Note (Signed)
 Add on intranasal steroid with flonase . Continue allegra

## 2024-01-15 NOTE — Assessment & Plan Note (Signed)
 Rate controlled and appears to be SR today. On chronic AC. Follow up with cardiology as scheduled

## 2024-01-23 ENCOUNTER — Ambulatory Visit (HOSPITAL_BASED_OUTPATIENT_CLINIC_OR_DEPARTMENT_OTHER): Payer: Medicare PPO | Admitting: Pulmonary Disease

## 2024-01-28 NOTE — Progress Notes (Signed)
 ADVANCED HF CLINIC NOTE  Date:  01/29/2024   ID:  Sabrina Douglas, Sabrina Douglas [birth year], MRN [id number]  Location: Home  Provider location: [facility] Type of Visit: Established patient  PCP:  Theophilus Andrews, Tully GRADE, MD  Cardiologist:  None Primary HF: Sabrina Douglas  Chief Complaint: Heart Failure follow-up   History of Present Illness: Sabrina Douglas is a 83 y.o. female with HTN, HL, anxiety/depression, and ILD due MCTD.    Patient seen for initial consult with Dr. Jude on 08/28/18. 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  ANA positive, CCP negative. 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    Admitted 2/20 with severe weakness. Fount to have hyponatremia at 114, dysphagia and about 20 pounds weight loss in the months. Treated with 3% saline and eventually demeclocycline.   Admitted 3/20 with new onset Afib. Converted back to NSR with amiodarone  drip and was discontinued. Eliquis  started. CT chest in 3/20 no coronary calcification. RHC 3/20 no PAH.   Follows with Dr. Jude & Mai. 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 Was on plaquenil/prednisone . Prednisone  stopped in 10/21. Switched to Cellcept .  4/23 Hires CT - no change   Echo 5/23 EF 60-65% RV normal   Underwent placement of Abbott dual chamber PM for bradycardia in 5/23.   Echo 4/24 showed EF 60-65%, normal RV, mild to moderate TR Echo 07/11/23, EF 60-65%  Today she returns for HF follow up with her daughter. Feeling great. Does ADLs without too much problem. No CP or Edema. Mild DOE. Complaint with meds. No dizziness or falls.    Cardiac Studies:  - Echo 4/24 showed EF 60-65%, normal RV, mild to moderate TR  - Echo 5/23 EF 60-65% RV normal   - Zio 10/12/20: 1. Sinus rhythm -  avg HR of 78 bpm.  2. Bundle Branch Block/IVCD was present.  3. 16 runs of SVT occurred, the run with the fastest interval lasting  7 beats with a max rate of 190 bpm, the longest lasting 8 beats with an avg rate of 114 bpm. 4. 708 episode(s) of brief nocturnal AV Block (2nd Mobitz II) occurred,  5. Rare PACs and PVCs 6. Nearly all patient-triggered events associated with sinus rhythm   - HiRes CT 10/20 1. Bilateral lower lobe predominant bronchiectasis and volume loss may be postinfectious or post inflammatory in etiology. Findings are suggestive of an alternative diagnosis (not UIP) per consensus guidelines: Diagnosis of Idiopathic Pulmonary Fibrosis  - Zio Patch 11/28/18  1. Predominant rhythm was sinus. Patient had a min HR of 54 bpm, max HR of 214 bpm, and avg HR of 96 bpm. 2. Two brief runs of NSVT - longest was 8 beats 3. Forty five brief runs of SVT. The run with the fastest interval lasting 5 beats with a max rate of 214 bpm, the longest lasting 15.7 secs with an avg rate of 154 bpm. 4. Rare PVCs   - RHC 10/15/18: normal pulm pressures, high output physiology without evidence of intracardiac shunting. RA = 3 RV = 39/4 PA = 39/7 (18) PCW = 4 Fick cardiac output/index = 6.5/3.8 PVR = 2.1 WU Ao sat = 100% PA sat = 81%, 82% High SVC and axillary sats both 81%   - Labs  ANA 1:1280 (pos) ESR 104  RF 55 (pos) RNP > 8.0  (pos) AntiJo negative CCP < 16  Anti-Smith 2.0 (pos) Scl-70 < 1.0 ANCA negative   - HRCT 09/10/18 1. 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 Repeat high-resolution chest CT is recommended in 12 months to assess for temporal changes in the appearance of the lung parenchyma. 2. Aortic atherosclerosis.   - Echo 09/30/18: EF > 65%, normal RV, RA-RV peak gradient increased at 66.9 mmHg consistent with probable severe pulmonary hypertension.    - PFTs  (10/02/18) FEV1 1.13 (66%) FVC   1.23 (56%) DLCO 16%   - VQ scan 10/03/18: Negative   Past Medical History:  Diagnosis Date    Allergy    Anxiety    Arthritis    CHF (congestive heart failure) (HCC)    Depression    Hyperlipidemia    Oxygen  deficiency    Past Surgical History:  Procedure Laterality Date   ABDOMINAL HYSTERECTOMY     BALLOON DILATION N/A 10/04/2018   Procedure: BALLOON DILATION;  Surgeon: Elicia Claw, MD;  Location: [facility];  Service: Gastroenterology;  Laterality: N/A;   BIOPSY  10/04/2018   Procedure: BIOPSY;  Surgeon: Elicia Claw, MD;  Location: [facility];  Service: Gastroenterology;;   CATARACT EXTRACTION     ESOPHAGOGASTRODUODENOSCOPY (EGD) WITH PROPOFOL  N/A 10/04/2018   Procedure: ESOPHAGOGASTRODUODENOSCOPY (EGD) WITH PROPOFOL ;  Surgeon: Elicia Claw, MD;  Location: [facility];  Service: Gastroenterology;  Laterality: N/A;   EYE SURGERY  Feb 2023   PACEMAKER IMPLANT N/A 12/17/2021   Procedure: PACEMAKER IMPLANT;  Surgeon: Inocencio Soyla Lunger, MD;  Location: [facility];  Service: Cardiovascular;  Laterality: N/A;   RIGHT HEART CATH N/A 10/15/2018   Procedure: RIGHT HEART CATH;  Surgeon: Cherrie Toribio SAUNDERS, MD;  Location: [facility];  Service: Cardiovascular;  Laterality: N/A;   Current Outpatient Medications  Medication Sig Dispense Refill   acetaminophen  (TYLENOL ) 325 MG tablet Take 325 mg by mouth every 6 (six) hours as needed for moderate pain or headache.     apixaban  (ELIQUIS ) 5 MG TABS tablet TAKE 1 TABLET(5 MG) BY MOUTH TWICE DAILY 180 tablet 3   cholecalciferol (VITAMIN D3) 25 MCG (1000 UNIT) tablet Take 1 tablet (1,000 Units total) by mouth daily.     fexofenadine (ALLEGRA) 180 MG tablet Take 180 mg by mouth daily as needed for allergies or rhinitis.     fluticasone  (FLONASE ) 50 MCG/ACT nasal spray Place 1-2 sprays into both nostrils daily. 18.2 mL 2   furosemide  (LASIX ) 40 MG tablet TAKE 1 TABLET(40 MG) BY MOUTH DAILY 90 tablet 1   metoprolol  succinate (TOPROL  XL) 25 MG 24 hr tablet Take 1 tablet (25 mg total) by mouth daily. 90  tablet 3   Multiple Vitamin (MULTIVITAMIN WITH MINERALS) TABS tablet Take 1 tablet by mouth daily.     mycophenolate  (CELLCEPT ) 500 MG tablet Take 500 mg by mouth 2 (two) times daily.     nystatin  (MYCOSTATIN /NYSTOP ) powder Apply 1 Application topically 3 (three) times daily. 60 g 3   pantoprazole  (PROTONIX ) 40 MG tablet TAKE 1 TABLET(40 MG) BY MOUTH DAILY 90 tablet 1   RESTASIS 0.05 % ophthalmic emulsion Place 1 drop into both eyes 2 (two) times daily. 8:30 am and 6:30 pm     SYSTANE ULTRA 0.4-0.3 % SOLN Apply 1 drop to eye 2 (two) times daily. 12:30 pm and 8 pm     cycloSPORINE (RESTASIS) 0.05 % ophthalmic emulsion 1 drop 2 (two) times daily. (Patient not taking:  Reported on 01/29/2024)     No current facility-administered medications for this encounter.    Allergies:   Pedi-pre tape spray [wound dressing adhesive]   Social History:  The patient  reports that she has never smoked. She has been exposed to tobacco smoke. She has never used smokeless tobacco. She reports that she does not currently use drugs. She reports that she does not drink alcohol .   Family History:  The patient's family history includes Cancer in her brother and father; Diabetes in her father; Prostate cancer in her father; Sudden death (age of onset: 56) in her mother.   ROS:  Please see the history of present illness.   All other systems are personally reviewed and negative.   Recent Labs: 04/06/2023: ALT 9; BUN 11; Creatinine, Ser 0.74; Hemoglobin 12.8; Platelets 280.0; Potassium 4.0; Sodium 137; TSH 1.68  Personally reviewed   Wt Readings from Last 3 Encounters:  01/29/24 70.3 kg (155 lb)  01/15/24 69.8 kg (153 lb 12.8 oz)  12/19/23 71.2 kg (157 lb)   BP 130/80   Pulse 69   Wt 70.3 kg (155 lb)   SpO2 100%   BMI 26.61 kg/m   Physical Exam General:  Elderly No resp difficulty HEENT: normal Neck: supple. no JVD. Carotids 2+ bilat; no bruits. No lymphadenopathy or thryomegaly appreciated. Cor: PMI  nondisplaced. Regular rate & rhythm. No rubs, gallops or murmurs. Lungs: clear Abdomen: soft, nontender, nondistended. No hepatosplenomegaly. No bruits or masses. Good bowel sounds. Extremities: no cyanosis, clubbing, rash, edema Neuro: alert & orientedx3, cranial nerves grossly intact. moves all 4 extremities w/o difficulty. Affect pleasant  Device interrogation (personally reviewed):  ASSESSMENT AND PLAN:  1. CHRONIC CP - much improved with addition of b-blocker - has refused cath versus cardiac CT  2. 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 Jude in Pulmonary - Much improved - VQ is negative. - RHC 10/15/18 with normal pulmonary pressures and high output physiology without evidence of intracardiac shunting so not candidate for selective pulmonary vasodilators  - Now on CellCept  per Dr. Mai. - Stable mild DOE - Now off O2. Sats > 90% at all times. Did ONOX last week. Results pending (not in Epic today)  3. PVCs - Zio 3/22 rare PVCs - no significant burden on PPM interrogation  4. Symptomatic bradycardia with high-grade HB  - s/p Abbott dual chamber PM 5/23 - 17% VP on interrogation - Echo 12/24: EF 60-65%, normal RV  5. Chronic hypoxic respiratory failure - likely due to ILD & less PAH - Much improved with treatment of MCTD.  - Followed closely by Dr Jude. Last hi-res CT 4/23, mild fibrosis present, unchanged from previous  - Continue current regimen - Stable. Followed by Dr. Jude - Now off O2. ONOX pending   6. Afib, paroxysmal/PSVT - No AF on Zio 3/22 - CHA2DS2-VASc is 4.  - Continue Eliquis  5 bid. No bleeding - With age > 35 if/when weight < 60kg will need to drop dose to 2.5 bid - Continue Toprol  XL - < 1% burden on PPM interrogation  7. HTN - Blood pressure well controlled. Continue current regimen. - No change - Recent labs reviewed and are stable, K 4.0, SCr 0.74, hgb 12.8   Toribio Fuel, MD   01/29/2024 12:04 PM  [facility] [address] [facility] [place] [address] [phone] (office) [phone] (fax)

## 2024-01-29 ENCOUNTER — Encounter (HOSPITAL_COMMUNITY): Payer: Self-pay | Admitting: Internal Medicine

## 2024-01-29 ENCOUNTER — Ambulatory Visit (HOSPITAL_COMMUNITY)
Admission: RE | Admit: 2024-01-29 | Discharge: 2024-01-29 | Disposition: A | Discharge status: Home / Self Care | Source: Ambulatory Visit | Attending: Internal Medicine | Admitting: Internal Medicine

## 2024-01-29 VITALS — BP 130/80 | HR 69 | Wt 155.0 lb

## 2024-01-29 DIAGNOSIS — I480 Paroxysmal atrial fibrillation: Secondary | ICD-10-CM | POA: Insufficient documentation

## 2024-01-29 DIAGNOSIS — M351 Other overlap syndromes: Secondary | ICD-10-CM

## 2024-01-29 DIAGNOSIS — Z950 Presence of cardiac pacemaker: Secondary | ICD-10-CM

## 2024-01-29 DIAGNOSIS — I10 Essential (primary) hypertension: Secondary | ICD-10-CM | POA: Diagnosis not present

## 2024-01-29 DIAGNOSIS — I493 Ventricular premature depolarization: Secondary | ICD-10-CM

## 2024-01-29 DIAGNOSIS — J9611 Chronic respiratory failure with hypoxia: Secondary | ICD-10-CM | POA: Diagnosis not present

## 2024-01-29 DIAGNOSIS — E785 Hyperlipidemia, unspecified: Secondary | ICD-10-CM | POA: Insufficient documentation

## 2024-01-29 DIAGNOSIS — F418 Other specified anxiety disorders: Secondary | ICD-10-CM | POA: Insufficient documentation

## 2024-01-29 DIAGNOSIS — Z79899 Other long term (current) drug therapy: Secondary | ICD-10-CM | POA: Insufficient documentation

## 2024-01-29 DIAGNOSIS — R079 Chest pain, unspecified: Secondary | ICD-10-CM

## 2024-01-29 DIAGNOSIS — G8929 Other chronic pain: Secondary | ICD-10-CM | POA: Insufficient documentation

## 2024-01-29 DIAGNOSIS — I471 Supraventricular tachycardia, unspecified: Secondary | ICD-10-CM | POA: Insufficient documentation

## 2024-01-29 DIAGNOSIS — R001 Bradycardia, unspecified: Secondary | ICD-10-CM | POA: Insufficient documentation

## 2024-01-29 DIAGNOSIS — Z7901 Long term (current) use of anticoagulants: Secondary | ICD-10-CM | POA: Insufficient documentation

## 2024-01-29 DIAGNOSIS — J8489 Other specified interstitial pulmonary diseases: Secondary | ICD-10-CM | POA: Insufficient documentation

## 2024-01-29 NOTE — Patient Instructions (Signed)
 Great to see you today!!!  Medication Changes:  None, continue current medications  Special Instructions // Education:  Do the following things EVERYDAY: Weigh yourself in the morning before breakfast. Write it down and keep it in a log. Take your medicines as prescribed Eat low salt foods--Limit salt (sodium) to 2000 mg per day.  Stay as active as you can everyday Limit all fluids for the day to less than 2 liters   Follow-Up in: 9 months (March), **PLEASE CALL OUR OFFICE IN [place] TO SCHEDULE THIS APPOINTMENT   At the [facility], you and your health needs are our priority. We have a designated team specialized in the treatment of Heart Failure. This Care Team includes your primary Heart Failure Specialized Cardiologist (physician), Advanced Practice Providers (APPs- Physician Assistants and Nurse Practitioners), and Pharmacist who all work together to provide you with the care you need, when you need it.   You may see any of the following providers on your designated Care Team at your next follow up:  Dr. Toribio Fuel Dr. Ezra Shuck Dr. Ria Commander Dr. Odis Brownie Greig Mosses, NP Caffie Shed, [place] [facility] [place], [place] Beckey Coe, NP [place] Lee, NP Tinnie Redman, PharmD   Please be sure to bring in all your medications bottles to every appointment.   Need to Contact Us :  If you have any questions or concerns before your next appointment please send us  a message through [place] or call our office at [phone].    TO LEAVE A MESSAGE FOR THE NURSE SELECT OPTION 2, PLEASE LEAVE A MESSAGE INCLUDING: YOUR NAME DATE OF BIRTH CALL BACK NUMBER REASON FOR CALL**this is important as we prioritize the call backs  YOU WILL RECEIVE A CALL BACK THE SAME DAY AS LONG AS YOU CALL BEFORE 4:00 PM

## 2024-02-05 NOTE — Progress Notes (Signed)
 Remote pacemaker transmission.

## 2024-02-12 ENCOUNTER — Encounter: Payer: Self-pay | Admitting: Cardiology

## 2024-02-14 ENCOUNTER — Encounter: Payer: Self-pay | Admitting: Internal Medicine

## 2024-02-25 DIAGNOSIS — R0902 Hypoxemia: Secondary | ICD-10-CM | POA: Diagnosis not present

## 2024-02-25 DIAGNOSIS — G4730 Sleep apnea, unspecified: Secondary | ICD-10-CM | POA: Diagnosis not present

## 2024-02-29 ENCOUNTER — Telehealth: Payer: Self-pay | Admitting: Nurse Practitioner

## 2024-02-29 DIAGNOSIS — G4734 Idiopathic sleep related nonobstructive alveolar hypoventilation: Secondary | ICD-10-CM

## 2024-02-29 DIAGNOSIS — J9611 Chronic respiratory failure with hypoxia: Secondary | ICD-10-CM

## 2024-02-29 NOTE — Telephone Encounter (Signed)
 02/25/2024 ONO on room air: Needs to resume O2 at 2 lpm. Spent 15 min 15 sec </88%, SpO2 low 77%. Thanks.

## 2024-03-01 NOTE — Telephone Encounter (Signed)
 Called and spoke with patient daughter,will place order.NFN

## 2024-03-06 DIAGNOSIS — J9611 Chronic respiratory failure with hypoxia: Secondary | ICD-10-CM | POA: Diagnosis not present

## 2024-03-08 ENCOUNTER — Other Ambulatory Visit: Payer: Self-pay | Admitting: Internal Medicine

## 2024-03-08 DIAGNOSIS — K219 Gastro-esophageal reflux disease without esophagitis: Secondary | ICD-10-CM

## 2024-03-18 ENCOUNTER — Ambulatory Visit: Payer: Medicare PPO

## 2024-03-18 DIAGNOSIS — I442 Atrioventricular block, complete: Secondary | ICD-10-CM | POA: Diagnosis not present

## 2024-03-18 LAB — CUP PACEART REMOTE DEVICE CHECK
Battery Remaining Longevity: 91 mo
Battery Remaining Percentage: 69 %
Battery Voltage: 2.99 V
Brady Statistic AP VP Percent: 1 %
Brady Statistic AP VS Percent: 21 %
Brady Statistic AS VP Percent: 9.2 %
Brady Statistic AS VS Percent: 68 %
Brady Statistic RA Percent Paced: 22 %
Brady Statistic RV Percent Paced: 10 %
Date Time Interrogation Session: 20250811020022
Implantable Lead Connection Status: 753985
Implantable Lead Connection Status: 753985
Implantable Lead Implant Date: 20230512
Implantable Lead Implant Date: 20230512
Implantable Lead Location: 753859
Implantable Lead Location: 753860
Implantable Pulse Generator Implant Date: 20230512
Lead Channel Impedance Value: 430 Ohm
Lead Channel Impedance Value: 430 Ohm
Lead Channel Pacing Threshold Amplitude: 0.5 V
Lead Channel Pacing Threshold Amplitude: 0.75 V
Lead Channel Pacing Threshold Pulse Width: 0.4 ms
Lead Channel Pacing Threshold Pulse Width: 0.4 ms
Lead Channel Sensing Intrinsic Amplitude: 1.9 mV
Lead Channel Sensing Intrinsic Amplitude: 12 mV
Lead Channel Setting Pacing Amplitude: 1 V
Lead Channel Setting Pacing Amplitude: 1.5 V
Lead Channel Setting Pacing Pulse Width: 0.4 ms
Lead Channel Setting Sensing Sensitivity: 2 mV
Pulse Gen Model: 2272
Pulse Gen Serial Number: 8063350

## 2024-03-19 ENCOUNTER — Ambulatory Visit: Payer: Self-pay | Admitting: Cardiology

## 2024-03-24 ENCOUNTER — Other Ambulatory Visit: Payer: Self-pay | Admitting: Internal Medicine

## 2024-03-24 DIAGNOSIS — B372 Candidiasis of skin and nail: Secondary | ICD-10-CM

## 2024-03-26 ENCOUNTER — Encounter: Payer: Self-pay | Admitting: Nurse Practitioner

## 2024-03-28 ENCOUNTER — Other Ambulatory Visit: Payer: Self-pay | Admitting: Pulmonary Disease

## 2024-03-28 DIAGNOSIS — I480 Paroxysmal atrial fibrillation: Secondary | ICD-10-CM

## 2024-03-28 DIAGNOSIS — M351 Other overlap syndromes: Secondary | ICD-10-CM | POA: Diagnosis not present

## 2024-03-28 DIAGNOSIS — I7300 Raynaud's syndrome without gangrene: Secondary | ICD-10-CM | POA: Diagnosis not present

## 2024-03-28 DIAGNOSIS — J849 Interstitial pulmonary disease, unspecified: Secondary | ICD-10-CM | POA: Diagnosis not present

## 2024-03-28 DIAGNOSIS — I4891 Unspecified atrial fibrillation: Secondary | ICD-10-CM | POA: Diagnosis not present

## 2024-03-28 DIAGNOSIS — G629 Polyneuropathy, unspecified: Secondary | ICD-10-CM | POA: Diagnosis not present

## 2024-03-28 DIAGNOSIS — I272 Pulmonary hypertension, unspecified: Secondary | ICD-10-CM | POA: Diagnosis not present

## 2024-03-28 DIAGNOSIS — E663 Overweight: Secondary | ICD-10-CM | POA: Diagnosis not present

## 2024-03-28 DIAGNOSIS — Z6826 Body mass index (BMI) 26.0-26.9, adult: Secondary | ICD-10-CM | POA: Diagnosis not present

## 2024-03-28 MED ORDER — APIXABAN 5 MG PO TABS
5.0000 mg | ORAL_TABLET | Freq: Two times a day (BID) | ORAL | 1 refills | Status: AC
Start: 2024-03-28 — End: ?

## 2024-03-28 NOTE — Telephone Encounter (Signed)
 Prescription refill request for Eliquis  received. Indication: Afib  Last office visit: 12/19/23 [place])  Scr: 0.74 (04/06/23)  Age: 83 Weight: 70.3kg  Appropriate dose. Refill sent.

## 2024-04-06 DIAGNOSIS — R0902 Hypoxemia: Secondary | ICD-10-CM | POA: Diagnosis not present

## 2024-04-25 ENCOUNTER — Other Ambulatory Visit: Payer: Self-pay | Admitting: Internal Medicine

## 2024-04-25 DIAGNOSIS — I272 Pulmonary hypertension, unspecified: Secondary | ICD-10-CM

## 2024-05-02 ENCOUNTER — Ambulatory Visit: Admitting: Family Medicine

## 2024-05-03 NOTE — Progress Notes (Signed)
 Remote PPM Transmission

## 2024-05-07 DIAGNOSIS — J9611 Chronic respiratory failure with hypoxia: Secondary | ICD-10-CM | POA: Diagnosis not present

## 2024-06-04 DIAGNOSIS — H5202 Hypermetropia, left eye: Secondary | ICD-10-CM | POA: Diagnosis not present

## 2024-06-04 DIAGNOSIS — Z961 Presence of intraocular lens: Secondary | ICD-10-CM | POA: Diagnosis not present

## 2024-06-04 DIAGNOSIS — H524 Presbyopia: Secondary | ICD-10-CM | POA: Diagnosis not present

## 2024-06-04 DIAGNOSIS — H16223 Keratoconjunctivitis sicca, not specified as Sjogren's, bilateral: Secondary | ICD-10-CM | POA: Diagnosis not present

## 2024-06-04 DIAGNOSIS — H52223 Regular astigmatism, bilateral: Secondary | ICD-10-CM | POA: Diagnosis not present

## 2024-06-04 DIAGNOSIS — H35033 Hypertensive retinopathy, bilateral: Secondary | ICD-10-CM | POA: Diagnosis not present

## 2024-06-06 DIAGNOSIS — R0902 Hypoxemia: Secondary | ICD-10-CM | POA: Diagnosis not present

## 2024-06-11 ENCOUNTER — Ambulatory Visit: Admitting: Internal Medicine

## 2024-06-11 ENCOUNTER — Encounter: Payer: Self-pay | Admitting: Internal Medicine

## 2024-06-11 VITALS — BP 124/84 | HR 88 | Temp 97.6°F | Ht 64.5 in | Wt 154.8 lb

## 2024-06-11 DIAGNOSIS — E785 Hyperlipidemia, unspecified: Secondary | ICD-10-CM | POA: Diagnosis not present

## 2024-06-11 DIAGNOSIS — R7303 Prediabetes: Secondary | ICD-10-CM

## 2024-06-11 DIAGNOSIS — M351 Other overlap syndromes: Secondary | ICD-10-CM

## 2024-06-11 DIAGNOSIS — Z0000 Encounter for general adult medical examination without abnormal findings: Secondary | ICD-10-CM | POA: Diagnosis not present

## 2024-06-11 DIAGNOSIS — E559 Vitamin D deficiency, unspecified: Secondary | ICD-10-CM | POA: Diagnosis not present

## 2024-06-11 DIAGNOSIS — R300 Dysuria: Secondary | ICD-10-CM | POA: Diagnosis not present

## 2024-06-11 DIAGNOSIS — J849 Interstitial pulmonary disease, unspecified: Secondary | ICD-10-CM | POA: Diagnosis not present

## 2024-06-11 DIAGNOSIS — R7302 Impaired glucose tolerance (oral): Secondary | ICD-10-CM

## 2024-06-11 DIAGNOSIS — I10 Essential (primary) hypertension: Secondary | ICD-10-CM | POA: Diagnosis not present

## 2024-06-11 DIAGNOSIS — B3731 Acute candidiasis of vulva and vagina: Secondary | ICD-10-CM

## 2024-06-11 LAB — CBC WITH DIFFERENTIAL/PLATELET
Basophils Absolute: 0 K/uL (ref 0.0–0.1)
Basophils Relative: 1 % (ref 0.0–3.0)
Eosinophils Absolute: 0.1 K/uL (ref 0.0–0.7)
Eosinophils Relative: 2 % (ref 0.0–5.0)
HCT: 38.9 % (ref 36.0–46.0)
Hemoglobin: 13 g/dL (ref 12.0–15.0)
Lymphocytes Relative: 19.7 % (ref 12.0–46.0)
Lymphs Abs: 0.6 K/uL — ABNORMAL LOW (ref 0.7–4.0)
MCHC: 33.5 g/dL (ref 30.0–36.0)
MCV: 92.3 fl (ref 78.0–100.0)
Monocytes Absolute: 0.5 K/uL (ref 0.1–1.0)
Monocytes Relative: 14.4 % — ABNORMAL HIGH (ref 3.0–12.0)
Neutro Abs: 2.1 K/uL (ref 1.4–7.7)
Neutrophils Relative %: 62.9 % (ref 43.0–77.0)
Platelets: 290 K/uL (ref 150.0–400.0)
RBC: 4.22 Mil/uL (ref 3.87–5.11)
RDW: 12.2 % (ref 11.5–15.5)
WBC: 3.3 K/uL — ABNORMAL LOW (ref 4.0–10.5)

## 2024-06-11 LAB — COMPREHENSIVE METABOLIC PANEL WITH GFR
ALT: 7 U/L (ref 0–35)
AST: 20 U/L (ref 0–37)
Albumin: 4.5 g/dL (ref 3.5–5.2)
Alkaline Phosphatase: 86 U/L (ref 39–117)
BUN: 17 mg/dL (ref 6–23)
CO2: 34 meq/L — ABNORMAL HIGH (ref 19–32)
Calcium: 9.5 mg/dL (ref 8.4–10.5)
Chloride: 98 meq/L (ref 96–112)
Creatinine, Ser: 0.82 mg/dL (ref 0.40–1.20)
GFR: 65.43 mL/min (ref >60.00)
Glucose, Bld: 102 mg/dL — ABNORMAL HIGH (ref 70–99)
Potassium: 4.1 meq/L (ref 3.5–5.1)
Sodium: 140 meq/L (ref 135–145)
Total Bilirubin: 0.5 mg/dL (ref 0.2–1.2)
Total Protein: 8.3 g/dL (ref 6.0–8.3)

## 2024-06-11 LAB — POC URINALSYSI DIPSTICK (AUTOMATED)
Bilirubin, UA: NEGATIVE
Blood, UA: NEGATIVE
Glucose, UA: NEGATIVE
Ketones, UA: NEGATIVE
Leukocytes, UA: NEGATIVE
Nitrite, UA: NEGATIVE
Protein, UA: NEGATIVE
Spec Grav, UA: 1.01 (ref 1.010–1.025)
Urobilinogen, UA: 0.2 U/dL
pH, UA: 7 (ref 5.0–8.0)

## 2024-06-11 LAB — LIPID PANEL
Cholesterol: 212 mg/dL — ABNORMAL HIGH (ref 0–200)
HDL: 45 mg/dL (ref >39.00)
LDL Cholesterol: 143 mg/dL — ABNORMAL HIGH (ref 0–99)
NonHDL: 166.95
Total CHOL/HDL Ratio: 5
Triglycerides: 118 mg/dL (ref 0.0–149.0)
VLDL: 23.6 mg/dL (ref 0.0–40.0)

## 2024-06-11 LAB — HEMOGLOBIN A1C: Hgb A1c MFr Bld: 6.1 % (ref 4.6–6.5)

## 2024-06-11 MED ORDER — FLUCONAZOLE 150 MG PO TABS: 150.0000 mg | ORAL_TABLET | Freq: Once | ORAL | 0 refills | Status: AC

## 2024-06-11 NOTE — Progress Notes (Signed)
 Established Patient Office Visit     CC/Reason for Visit: Annual preventive exam and subsequent Medicare wellness visit  HPI: Sabrina Douglas is a 83 y.o. female who is coming in today for the above mentioned reasons. 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  She has been having a thick, white vaginal discharge.  It is very pruritic.  She wonders if she has a urine infection.  She recently had her flu vaccine.  Is requesting updated labs.   Past Medical/Surgical History: Past Medical History:  Diagnosis Date   Allergy    Anxiety    Arthritis    CHF (congestive heart failure) (HCC)    Depression    Hyperlipidemia    Oxygen  deficiency     Past Surgical History:  Procedure Laterality Date   ABDOMINAL HYSTERECTOMY     BALLOON DILATION N/A 10/04/2018   Procedure: BALLOON DILATION;  Surgeon: Elicia Claw, MD;  Location: [facility];  Service: Gastroenterology;  Laterality: N/A;   BIOPSY  10/04/2018   Procedure: BIOPSY;  Surgeon: Elicia Claw, MD;  Location: [facility];  Service: Gastroenterology;;   CATARACT EXTRACTION     ESOPHAGOGASTRODUODENOSCOPY (EGD) WITH PROPOFOL  N/A 10/04/2018   Procedure: ESOPHAGOGASTRODUODENOSCOPY (EGD) WITH PROPOFOL ;  Surgeon: Elicia Claw, MD;  Location: [facility];  Service: Gastroenterology;  Laterality: N/A;   EYE SURGERY  Feb 2023   PACEMAKER IMPLANT N/A 12/17/2021   Procedure: PACEMAKER IMPLANT;  Surgeon: Inocencio Soyla Lunger, MD;  Location: [facility];  Service: Cardiovascular;  Laterality: N/A;   RIGHT HEART CATH N/A 10/15/2018   Procedure: RIGHT HEART CATH;  Surgeon: Cherrie Toribio SAUNDERS, MD;  Location: [facility];  Service: Cardiovascular;  Laterality: N/A;    Social History:  reports that she has never smoked. She has been exposed to tobacco smoke. She has never used  smokeless tobacco. She reports that she does not currently use drugs. She reports that she does not drink alcohol .  Allergies: Allergies  Allergen Reactions   Pedi-Pre Tape Spray [Wound Dressing Adhesive] Other (See Comments)    Blisters and bruisers    Family History:  Family History  Problem Relation Age of Onset   Sudden death Mother 38       sudden death post hysterectomy   Prostate cancer Father    Diabetes Father    Cancer Father    Cancer Brother      Current Outpatient Medications:    acetaminophen  (TYLENOL ) 325 MG tablet, Take 325 mg by mouth every 6 (six) hours as needed for moderate pain or headache., Disp: , Rfl:    apixaban  (ELIQUIS ) 5 MG TABS tablet, Take 1 tablet (5 mg total) by mouth 2 (two) times daily., Disp: 180 tablet, Rfl: 1   cholecalciferol (VITAMIN D3) 25 MCG (1000 UNIT) tablet, Take 1 tablet (1,000 Units total) by mouth daily., Disp: , Rfl:    cycloSPORINE (RESTASIS) 0.05 % ophthalmic emulsion, 1 drop 2 (two) times daily., Disp: , Rfl:    fexofenadine (ALLEGRA) 180 MG tablet, Take 180 mg by mouth daily as needed for allergies or rhinitis., Disp: , Rfl:    fluconazole (DIFLUCAN) 150 MG tablet, Take 1 tablet (150 mg total) by mouth once for 1 dose., Disp: 1 tablet, Rfl: 0   furosemide  (LASIX ) 40 MG tablet, TAKE 1 TABLET(40 MG) BY MOUTH DAILY, Disp: 90 tablet, Rfl: 0   metoprolol  succinate (TOPROL -XL) 25 MG 24 hr  tablet, TAKE 1 TABLET(25 MG) BY MOUTH DAILY, Disp: 90 tablet, Rfl: 2   Multiple Vitamin (MULTIVITAMIN WITH MINERALS) TABS tablet, Take 1 tablet by mouth daily., Disp: , Rfl:    mycophenolate  (CELLCEPT ) 500 MG tablet, Take 500 mg by mouth 2 (two) times daily., Disp: , Rfl:    NYSTATIN  powder, APPLY 1 APPLICATION TOPICALLY 3 TIMES DAILY, Disp: 60 g, Rfl: 3   pantoprazole  (PROTONIX ) 40 MG tablet, TAKE 1 TABLET(40 MG) BY MOUTH DAILY, Disp: 30 tablet, Rfl: 0   RESTASIS 0.05 % ophthalmic emulsion, Place 1 drop into both eyes 2 (two) times daily. 8:30 am and  6:30 pm, Disp: , Rfl:    SYSTANE ULTRA 0.4-0.3 % SOLN, Apply 1 drop to eye 2 (two) times daily. 12:30 pm and 8 pm, Disp: , Rfl:    fluticasone  (FLONASE ) 50 MCG/ACT nasal spray, Place 1-2 sprays into both nostrils daily., Disp: 18.2 mL, Rfl: 2  Review of Systems:  Negative unless indicated in HPI.   Physical Exam: Vitals:   06/11/24 0854  BP: 124/84  Pulse: 88  Temp: 97.6 F (36.4 C)  TempSrc: Oral  SpO2: 98%  Weight: 154 lb 12.8 oz (70.2 kg)  Height: 5' 4.5 (1.638 m)    Body mass index is 26.16 kg/m.   Physical Exam Vitals reviewed.  Constitutional:      General: She is not in acute distress.    Appearance: Normal appearance. She is not ill-appearing, toxic-appearing or diaphoretic.  HENT:     Head: Normocephalic.     Right Ear: Tympanic membrane, ear canal and external ear normal. There is no impacted cerumen.     Left Ear: Tympanic membrane, ear canal and external ear normal. There is no impacted cerumen.     Nose: Nose normal.     Mouth/Throat:     Mouth: Mucous membranes are moist.     Pharynx: Oropharynx is clear. No oropharyngeal exudate or posterior oropharyngeal erythema.  Eyes:     General: No scleral icterus.       Right eye: No discharge.        Left eye: No discharge.     Conjunctiva/sclera: Conjunctivae normal.     Pupils: Pupils are equal, round, and reactive to light.  Neck:     Vascular: No carotid bruit.  Cardiovascular:     Rate and Rhythm: Normal rate and regular rhythm.     Pulses: Normal pulses.     Heart sounds: Normal heart sounds.  Pulmonary:     Effort: Pulmonary effort is normal. No respiratory distress.     Breath sounds: Normal breath sounds.  Abdominal:     General: Abdomen is flat. Bowel sounds are normal.     Palpations: Abdomen is soft.  Musculoskeletal:        General: Normal range of motion.     Cervical back: Normal range of motion.  Skin:    General: Skin is warm and dry.  Neurological:     General: No focal deficit  present.     Mental Status: She is alert and oriented to person, place, and time. Mental status is at baseline.  Psychiatric:        Mood and Affect: Mood normal.        Behavior: Behavior normal.        Thought Content: Thought content normal.        Judgment: Judgment normal.    Subsequent Medicare wellness visit   1. Risk factors, based on past  M,S,F -     2.  Physical activities: Dietary issues and exercise activities discussed:      3.  Depression/mood:  Flowsheet Row Office Visit from 06/11/2024 in [facility] Total Score 9     4.  ADL's: Independent in all ADLs     No data to display           5.  Fall risk:     12/17/2021    8:00 PM 12/18/2021   10:00 AM 04/04/2022    8:35 AM 04/06/2023    9:21 AM 06/11/2024    8:47 AM  Fall Risk  Falls in the past year?   0 0 0  Was there an injury with Fall?   0 0 0  Fall Risk Category Calculator   0 0 0  Fall Risk Category (Retired)   Low     (RETIRED) Patient Fall Risk Level Moderate fall risk  Moderate fall risk  Low fall risk     Patient at Risk for Falls Due to   No Fall Risks    Fall risk Follow up   Falls evaluation completed  Falls evaluation completed Falls evaluation completed     Data saved with a previous flowsheet row definition     6.  Home safety: No problems identified   7.  Height weight, and visual acuity: height and weight as above, vision/hearing: Vision Screening   Right eye Left eye Both eyes  Without correction     With correction 20/40 20/40 20/40      8.  Counseling: Counseling given: Not Answered    9. Lab orders based on risk factors: Laboratory update will be reviewed   10. Cognitive assessment:        06/11/2024    8:52 AM 04/06/2023    9:05 AM  6CIT Screen  What Year? 0 points 0 points  What month? 0 points 0 points  What time? 0 points 0 points  Count back from 20 0 points 0 points  Months in reverse 0 points 0 points  Repeat phrase 0  points 0 points  Total Score 0 points 0 points     11. Screening: Patient provided with a written and personalized 5-10 year screening schedule in the AVS. Health Maintenance  Topic Date Due   DTaP/Tdap/Td vaccine (1 - Tdap) Never done   Zoster (Shingles) Vaccine (1 of 2) Never done   Medicare Annual Wellness Visit  04/05/2024   COVID-19 Vaccine (4 - 2025-26 season) 04/08/2024   Pneumococcal Vaccine for age over 54  Completed   Flu Shot  Completed   DEXA scan (bone density measurement)  Completed   Meningitis B Vaccine  Aged Out    12. Provider List Update: Patient Care Team    Relationship Specialty Notifications Start End  Theophilus Andrews, Tully GRADE, MD PCP - General Internal Medicine  10/10/18   Inocencio Soyla Lunger, MD PCP - Electrophysiology Clinical Cardiac Electrophysiology  12/18/21   Lionell Jon DEL, [facility]  Pharmacist  12/27/22      13. Advance Directives: Does Patient Have a Medical Advance Directive?: Yes  14. Opioids: Patient is not on any opioid prescriptions and has no risk factors for a substance use disorder.   15.   Goals      keep going         I have personally reviewed and noted the following in the patient's chart:   Medical and social history Use  of alcohol , tobacco or illicit drugs  Current medications and supplements Functional ability and status Nutritional status Physical activity Advanced directives List of other physicians Hospitalizations, surgeries, and ER visits in previous 12 months Vitals Screenings to include cognitive, depression, and falls Referrals and appointments  In addition, I have reviewed and discussed with patient certain preventive protocols, quality metrics, and best practice recommendations. A written personalized care plan for preventive services as well as general preventive health recommendations were provided to patient.   Impression and Plan:  Medicare annual wellness visit,  subsequent  Prediabetes  Hyperlipidemia, unspecified hyperlipidemia type -     Lipid panel; Future  Essential hypertension -     CBC with Differential/Platelet; Future -     Comprehensive metabolic panel with GFR; Future  Vitamin D  deficiency -     VITAMIN D  25 Hydroxy (Vit-D Deficiency, Fractures); Future  Dysuria -     POCT Urinalysis Dipstick (Automated)  ILD (interstitial lung disease) (HCC) -     TSH; Future -     Vitamin B12; Future  IGT (impaired glucose tolerance) -     Hemoglobin A1c; Future  MCTD (mixed connective tissue disease)  Vaginal yeast infection -     Fluconazole; Take 1 tablet (150 mg total) by mouth once for 1 dose.  Dispense: 1 tablet; Refill: 0   -Recommend routine eye and dental care. -Healthy lifestyle discussed in detail. -Labs to be updated today. -Prostate cancer screening: Not applicable Health Maintenance  Topic Date Due   DTaP/Tdap/Td vaccine (1 - Tdap) Never done   Zoster (Shingles) Vaccine (1 of 2) Never done   Medicare Annual Wellness Visit  04/05/2024   COVID-19 Vaccine (4 - 2025-26 season) 04/08/2024   Pneumococcal Vaccine for age over 12  Completed   Flu Shot  Completed   DEXA scan (bone density measurement)  Completed   Meningitis B Vaccine  Aged Out     -Advised to update Tdap and shingles at pharmacy. - In office urine culture is negative. - Send Diflucan 150 mg p.o. x 1 dose for vaginal yeast.      Tully Theophilus Andrews, MD [facility]

## 2024-06-11 NOTE — Progress Notes (Signed)
 Medicare Wellness question were answered 06/09/24 and11/4/25

## 2024-06-12 ENCOUNTER — Ambulatory Visit: Payer: Self-pay | Admitting: Internal Medicine

## 2024-06-12 LAB — VITAMIN B12: Vitamin B-12: 581 pg/mL (ref 211–911)

## 2024-06-12 LAB — TSH: TSH: 1.88 u[IU]/mL (ref 0.35–5.50)

## 2024-06-12 LAB — VITAMIN D 25 HYDROXY (VIT D DEFICIENCY, FRACTURES): VITD: 42.08 ng/mL (ref 30.00–100.00)

## 2024-06-17 ENCOUNTER — Ambulatory Visit (INDEPENDENT_AMBULATORY_CARE_PROVIDER_SITE_OTHER): Payer: Medicare PPO

## 2024-06-17 DIAGNOSIS — I480 Paroxysmal atrial fibrillation: Secondary | ICD-10-CM

## 2024-06-17 LAB — CUP PACEART REMOTE DEVICE CHECK
Battery Remaining Longevity: 88 mo
Battery Remaining Percentage: 67 %
Battery Voltage: 2.99 V
Brady Statistic AP VP Percent: 1.1 %
Brady Statistic AP VS Percent: 25 %
Brady Statistic AS VP Percent: 13 %
Brady Statistic AS VS Percent: 60 %
Brady Statistic RA Percent Paced: 25 %
Brady Statistic RV Percent Paced: 14 %
Date Time Interrogation Session: 20251110021825
Implantable Lead Connection Status: 753985
Implantable Lead Connection Status: 753985
Implantable Lead Implant Date: 20230512
Implantable Lead Implant Date: 20230512
Implantable Lead Location: 753859
Implantable Lead Location: 753860
Implantable Pulse Generator Implant Date: 20230512
Lead Channel Impedance Value: 440 Ohm
Lead Channel Impedance Value: 440 Ohm
Lead Channel Pacing Threshold Amplitude: 0.5 V
Lead Channel Pacing Threshold Amplitude: 0.625 V
Lead Channel Pacing Threshold Pulse Width: 0.4 ms
Lead Channel Pacing Threshold Pulse Width: 0.4 ms
Lead Channel Sensing Intrinsic Amplitude: 12 mV
Lead Channel Sensing Intrinsic Amplitude: 2.4 mV
Lead Channel Setting Pacing Amplitude: 0.875
Lead Channel Setting Pacing Amplitude: 1.5 V
Lead Channel Setting Pacing Pulse Width: 0.4 ms
Lead Channel Setting Sensing Sensitivity: 2 mV
Pulse Gen Model: 2272
Pulse Gen Serial Number: 8063350

## 2024-06-18 ENCOUNTER — Ambulatory Visit: Payer: Self-pay | Admitting: Cardiology

## 2024-06-20 NOTE — Progress Notes (Signed)
 Remote PPM Transmission

## 2024-06-30 ENCOUNTER — Encounter: Payer: Self-pay | Admitting: Internal Medicine

## 2024-06-30 DIAGNOSIS — K219 Gastro-esophageal reflux disease without esophagitis: Secondary | ICD-10-CM

## 2024-07-01 ENCOUNTER — Other Ambulatory Visit: Payer: Self-pay | Admitting: Internal Medicine

## 2024-07-01 DIAGNOSIS — K219 Gastro-esophageal reflux disease without esophagitis: Secondary | ICD-10-CM

## 2024-07-01 MED ORDER — PANTOPRAZOLE SODIUM 40 MG PO TBEC: 40.0000 mg | DELAYED_RELEASE_TABLET | Freq: Every day | ORAL | 1 refills | Status: AC

## 2024-07-02 DIAGNOSIS — G629 Polyneuropathy, unspecified: Secondary | ICD-10-CM | POA: Diagnosis not present

## 2024-07-02 DIAGNOSIS — I272 Pulmonary hypertension, unspecified: Secondary | ICD-10-CM | POA: Diagnosis not present

## 2024-07-02 DIAGNOSIS — Z6825 Body mass index (BMI) 25.0-25.9, adult: Secondary | ICD-10-CM | POA: Diagnosis not present

## 2024-07-02 DIAGNOSIS — J849 Interstitial pulmonary disease, unspecified: Secondary | ICD-10-CM | POA: Diagnosis not present

## 2024-07-02 DIAGNOSIS — I7300 Raynaud's syndrome without gangrene: Secondary | ICD-10-CM | POA: Diagnosis not present

## 2024-07-02 DIAGNOSIS — E7849 Other hyperlipidemia: Secondary | ICD-10-CM | POA: Diagnosis not present

## 2024-07-02 DIAGNOSIS — E663 Overweight: Secondary | ICD-10-CM | POA: Diagnosis not present

## 2024-07-02 DIAGNOSIS — M351 Other overlap syndromes: Secondary | ICD-10-CM | POA: Diagnosis not present

## 2024-08-06 ENCOUNTER — Other Ambulatory Visit: Payer: Self-pay | Admitting: Internal Medicine

## 2024-08-06 DIAGNOSIS — I272 Pulmonary hypertension, unspecified: Secondary | ICD-10-CM

## 2024-09-16 ENCOUNTER — Ambulatory Visit: Payer: Medicare PPO

## 2024-09-27 ENCOUNTER — Ambulatory Visit (HOSPITAL_BASED_OUTPATIENT_CLINIC_OR_DEPARTMENT_OTHER): Admitting: Pulmonary Disease

## 2024-10-21 ENCOUNTER — Ambulatory Visit (HOSPITAL_COMMUNITY): Admitting: Internal Medicine

## 2024-12-16 ENCOUNTER — Ambulatory Visit: Payer: Medicare PPO

## 2024-12-18 ENCOUNTER — Ambulatory Visit: Admitting: Pulmonary Disease

## 2025-03-17 ENCOUNTER — Ambulatory Visit: Payer: Medicare PPO

## 2025-06-16 ENCOUNTER — Ambulatory Visit: Payer: Medicare PPO
